# Patient Record
Sex: Male | Born: 1960
Health system: Southern US, Community
[De-identification: ages and names within clinical notes are randomized; demographics above are authoritative.]

## PROBLEM LIST (undated history)

## (undated) DIAGNOSIS — F411 Generalized anxiety disorder: Secondary | ICD-10-CM

## (undated) DIAGNOSIS — E781 Pure hyperglyceridemia: Secondary | ICD-10-CM

## (undated) DIAGNOSIS — E119 Type 2 diabetes mellitus without complications: Secondary | ICD-10-CM

## (undated) DIAGNOSIS — F329 Major depressive disorder, single episode, unspecified: Secondary | ICD-10-CM

## (undated) DIAGNOSIS — I10 Essential (primary) hypertension: Secondary | ICD-10-CM

## (undated) DIAGNOSIS — I509 Heart failure, unspecified: Secondary | ICD-10-CM

## (undated) HISTORY — PX: TONSILLECTOMY: SUR1361

## (undated) HISTORY — DX: Generalized anxiety disorder: F41.1

## (undated) HISTORY — DX: Heart failure, unspecified: I50.9

## (undated) HISTORY — DX: Major depressive disorder, single episode, unspecified: F32.9

## (undated) HISTORY — DX: Type 2 diabetes mellitus without complications: E11.9

---

## 2012-08-25 ENCOUNTER — Emergency Department (INDEPENDENT_AMBULATORY_CARE_PROVIDER_SITE_OTHER)
Admission: EM | Admit: 2012-08-25 | Discharge: 2012-08-25 | Disposition: A | Discharge status: Home / Self Care | Payer: No Typology Code available for payment source | Source: Home / Self Care | Attending: Family Medicine | Admitting: Family Medicine

## 2012-08-25 ENCOUNTER — Encounter: Payer: Self-pay | Admitting: *Deleted

## 2012-08-25 DIAGNOSIS — J111 Influenza due to unidentified influenza virus with other respiratory manifestations: Secondary | ICD-10-CM

## 2012-08-25 HISTORY — DX: Pure hyperglyceridemia: E78.1

## 2012-08-25 MED ORDER — HYDROCOD POLST-CHLORPHEN POLST 10-8 MG/5ML PO LQCR: 5.0000 mL | Freq: Every evening | ORAL | Status: DC | PRN

## 2012-08-25 MED ORDER — CLARITHROMYCIN 500 MG PO TABS: 500.0000 mg | ORAL_TABLET | Freq: Two times a day (BID) | ORAL | Status: DC

## 2012-08-25 NOTE — ED Notes (Signed)
Patient c/o dry cough, congestion, body ache and wheezing x 2-3 days.

## 2012-08-25 NOTE — ED Provider Notes (Signed)
History     CSN: [id number]  Arrival date & time 08/25/12  17   First MD Initiated Contact with Patient 08/25/12 1628      Chief Complaint  Patient presents with  . Cough     HPI Comments: Patient complains of onset of flu-like symptoms about 3 days ago with sudden onset mild sore throat, myalgias, fatigue, sinus congestion, chills, and cough.  The cough has now become worse and is partly productive.  He had wheezing last night.   He has not had influenza immunization for this season.    The history is provided by the patient.    Past Medical History  Diagnosis Date  . Diabetes mellitus without complication   . High triglycerides     Past Surgical History  Procedure Date  . Tonsillectomy     Family History  Problem Relation Age of Onset  . Heart failure Mother     History  Substance Use Topics  . Smoking status: Never Smoker   . Smokeless tobacco: Not on file  . Alcohol Use: No      Review of Systems + sore throat + cough No pleuritic pain + wheezing + nasal congestion + post-nasal drainage No sinus pain/pressure No itchy/red eyes No earache No hemoptysis + SOB No fever, + chills + nausea No vomiting No abdominal pain + diarrhea, resolved No urinary symptoms No skin rashes + fatigue No myalgias No headache Used OTC meds without relief  Allergies  Review of patient's allergies indicates no known allergies.  Home Medications   Current Outpatient Rx  Name  Route  Sig  Dispense  Refill  . HYDROCOD POLST-CPM POLST ER 10-8 MG/5ML PO LQCR   Oral   Take 5 mLs by mouth at bedtime as needed.   115 mL   0   . CLARITHROMYCIN 500 MG PO TABS   Oral   Take 1 tablet (500 mg total) by mouth 2 (two) times daily.   14 tablet   0     BP 142/95  Pulse 96  Temp 99 F (37.2 C) (Oral)  Resp 16  Ht 5' 8.5" (1.74 m)  Wt 181 lb (82.101 kg)  BMI 27.12 kg/m2  SpO2 97%  Physical Exam Nursing notes and Vital Signs reviewed. Appearance:  Patient  appears healthy, stated age, and in no acute distress Eyes:  Pupils are equal, round, and reactive to light and accomodation.  Extraocular movement is intact.  Conjunctivae are not inflamed  Ears:  Canals normal.  Tympanic membranes normal.  Nose:  Mildly congested turbinates.  No sinus tenderness.   Pharynx:  Normal Neck:  Supple.  Slightly tender shotty posterior nodes are palpated bilaterally  Lungs:  Clear to auscultation.  Breath sounds are equal.  Heart:  Regular rate and rhythm without murmurs, rubs, or gallops.  Abdomen:  Nontender without masses or hepatosplenomegaly.  Bowel sounds are present.  No CVA or flank tenderness.  Extremities:  No edema.  No calf tenderness Skin:  No rash present.   ED Course  Procedures  none      1. Influenza-like illness       MDM  Empirically begin Biaxin to cover atypical agents (too late for Tamiflu).  Tussionex at bedtime. Take plain Mucinex (guaifenesin) twice daily for cough and congestion.  Increase fluid intake, rest. May use Afrin nasal spray (or generic oxymetazoline) twice daily for about 5 days.  Also recommend using saline nasal spray several times daily and saline nasal irrigation (  AYR is a common brand) Stop all antihistamines for now, and other non-prescription cough/cold preparations. May take Ibuprofen 200mg , 4 tabs every 8 hours with food for chest discomfort, sore throat, headache, fever, etc. If symptoms become significantly worse during the night or over the weekend, proceed to the local emergency room        Kandra Nicolas, MD 08/28/12 [phone]

## 2013-05-17 ENCOUNTER — Emergency Department (INDEPENDENT_AMBULATORY_CARE_PROVIDER_SITE_OTHER)
Admission: EM | Admit: 2013-05-17 | Discharge: 2013-05-17 | Disposition: A | Discharge status: Home / Self Care | Payer: No Typology Code available for payment source | Source: Home / Self Care | Attending: Emergency Medicine | Admitting: Emergency Medicine

## 2013-05-17 ENCOUNTER — Encounter: Payer: Self-pay | Admitting: Emergency Medicine

## 2013-05-17 DIAGNOSIS — N529 Male erectile dysfunction, unspecified: Secondary | ICD-10-CM

## 2013-05-17 DIAGNOSIS — IMO0002 Reserved for concepts with insufficient information to code with codable children: Secondary | ICD-10-CM

## 2013-05-17 DIAGNOSIS — L03012 Cellulitis of left finger: Secondary | ICD-10-CM

## 2013-05-17 DIAGNOSIS — E1169 Type 2 diabetes mellitus with other specified complication: Secondary | ICD-10-CM

## 2013-05-17 HISTORY — DX: Essential (primary) hypertension: I10

## 2013-05-17 MED ORDER — TRAMADOL HCL 50 MG PO TABS: 50.0000 mg | ORAL_TABLET | Freq: Four times a day (QID) | ORAL | Status: DC | PRN

## 2013-05-17 MED ORDER — TADALAFIL 5 MG PO TABS: 5.0000 mg | ORAL_TABLET | Freq: Every day | ORAL | Status: DC | PRN

## 2013-05-17 MED ORDER — SULFAMETHOXAZOLE-TMP DS 800-160 MG PO TABS: 1.0000 | ORAL_TABLET | Freq: Two times a day (BID) | ORAL | Status: DC

## 2013-05-17 NOTE — ED Notes (Signed)
Reports trying to resolve ingrown toenail right large toe about 2 weeks ago; now sore and possibly infected. Took ibuprofen 2 hours ago.

## 2013-05-17 NOTE — ED Provider Notes (Signed)
CSN: [id number]     Arrival date & time 05/17/13  1045 History   First MD Initiated Contact with Patient 05/17/13 1046     Chief Complaint  Patient presents with  . Toe Pain   (Consider location/radiation/quality/duration/timing/severity/associated sxs/prior Treatment) HPI 1) Pt c/o swelling and pain L great toe for 2 weeks.  Has been picking and squeezing which has caused some pus to come out.  Not using heat or cold.  Doesn't seem to be getting much better.  Has been using Ibu 600 BID with some success. 2) Pt requesting Rx for Cialis.  Has coupon with his.  Has ED.  Doesn't take Nitrates, has no CP, and has no h/o vasovagal symptoms. 3) Pt c/o intermittent depression.  Occasionally has taken meds in the past but is requesting something.  No SI/HI.  Feels depressed sometimes and has stress especially at work.  Past Medical History  Diagnosis Date  . Diabetes mellitus without complication   . High triglycerides   . Hypertension    Past Surgical History  Procedure Laterality Date  . Tonsillectomy     Family History  Problem Relation Age of Onset  . Heart failure Mother    History  Substance Use Topics  . Smoking status: Never Smoker   . Smokeless tobacco: Not on file  . Alcohol Use: Yes    Review of Systems  All other systems reviewed and are negative.    Allergies  Penicillins and Terramycin  Home Medications   Current Outpatient Rx  Name  Route  Sig  Dispense  Refill  . chlorpheniramine-HYDROcodone (TUSSIONEX PENNKINETIC ER) 10-8 MG/5ML LQCR   Oral   Take 5 mLs by mouth at bedtime as needed.   115 mL   0   . clarithromycin (BIAXIN) 500 MG tablet   Oral   Take 1 tablet (500 mg total) by mouth 2 (two) times daily.   14 tablet   0   . sulfamethoxazole-trimethoprim (BACTRIM DS) 800-160 MG per tablet   Oral   Take 1 tablet by mouth 2 (two) times daily.   20 tablet   0   . tadalafil (CIALIS) 5 MG tablet   Oral   Take 1 tablet (5 mg total) by mouth daily  as needed for erectile dysfunction.   30 tablet   0   . traMADol (ULTRAM) 50 MG tablet   Oral   Take 1 tablet (50 mg total) by mouth every 6 (six) hours as needed for pain.   16 tablet   0    BP 142/83  Pulse 87  Temp(Src) 98.2 F (36.8 C) (Oral)  Resp 18  Ht 5' 8.5" (1.74 m)  Wt 186 lb (84.369 kg)  BMI 27.87 kg/m2  SpO2 97% Physical Exam  Nursing note and vitals reviewed. Constitutional: He is oriented to person, place, and time. He appears well-developed and well-nourished.  HENT:  Head: Normocephalic and atraumatic.  Eyes: No scleral icterus.  Neck: Neck supple.  Cardiovascular: Regular rhythm and normal heart sounds.   Pulmonary/Chest: Effort normal and breath sounds normal. No respiratory distress.  Neurological: He is alert and oriented to person, place, and time.  Skin: Skin is warm and dry.  L great toe medial aspect with tenderness, redness, swelling, and dried scab.  FROM IP and MTP.  No other tenderness in foot or ankle.  Cap refill and sensation normal.  Psychiatric: He has a normal mood and affect. His speech is normal.    ED Course  Procedures (including critical care time) Labs Review Labs Reviewed - No data to display Imaging Review No results found.  EKG Interpretation     Ventricular Rate:    PR Interval:    QRS Duration:   QT Interval:    QTC Calculation:   R Axis:     Text Interpretation:              MDM   1. Paronychia, left   2. Erectile dysfunction associated with type 2 diabetes mellitus    1) Patient with a paronychia of L great toe.  Will place on 10 days Bactrim + warm compresses.  Can consider partial toenail excision in the future if not improving.  Also gave Rx for Tramadol for pain.  2) Did give temporary Rx for Cialis but will need to get all additional Rx for ED from PCP.  Likely secondary to DM2, but patient states that he is upset when all docs attribute everything to his DM2.  3) Did not give SSRI.  We deferred  this to his PCP.  We walked patient to the PCP next door so he can make appt to establish.  No SI/HI.    Janeann Forehand, MD 05/17/13 1126

## 2013-05-23 ENCOUNTER — Telehealth: Payer: Self-pay | Admitting: *Deleted

## 2013-05-25 ENCOUNTER — Ambulatory Visit (INDEPENDENT_AMBULATORY_CARE_PROVIDER_SITE_OTHER): Payer: No Typology Code available for payment source | Admitting: Family Medicine

## 2013-05-25 ENCOUNTER — Encounter: Payer: Self-pay | Admitting: Family Medicine

## 2013-05-25 VITALS — BP 136/75 | HR 80 | Ht 68.5 in | Wt 183.0 lb

## 2013-05-25 DIAGNOSIS — E119 Type 2 diabetes mellitus without complications: Secondary | ICD-10-CM

## 2013-05-25 DIAGNOSIS — E1142 Type 2 diabetes mellitus with diabetic polyneuropathy: Secondary | ICD-10-CM | POA: Insufficient documentation

## 2013-05-25 DIAGNOSIS — F329 Major depressive disorder, single episode, unspecified: Secondary | ICD-10-CM

## 2013-05-25 DIAGNOSIS — E1165 Type 2 diabetes mellitus with hyperglycemia: Secondary | ICD-10-CM | POA: Insufficient documentation

## 2013-05-25 DIAGNOSIS — F3289 Other specified depressive episodes: Secondary | ICD-10-CM

## 2013-05-25 DIAGNOSIS — F32A Depression, unspecified: Secondary | ICD-10-CM

## 2013-05-25 DIAGNOSIS — M79674 Pain in right toe(s): Secondary | ICD-10-CM

## 2013-05-25 DIAGNOSIS — M79609 Pain in unspecified limb: Secondary | ICD-10-CM

## 2013-05-25 DIAGNOSIS — N529 Male erectile dysfunction, unspecified: Secondary | ICD-10-CM | POA: Insufficient documentation

## 2013-05-25 MED ORDER — PAROXETINE HCL 20 MG PO TABS: 20.0000 mg | ORAL_TABLET | ORAL | Status: DC

## 2013-05-25 MED ORDER — ONETOUCH ULTRA SYSTEM W/DEVICE KIT: PACK | Status: DC

## 2013-05-25 NOTE — Progress Notes (Signed)
CC: Seth Weeks is a 52 y.o. male is here for Establish Care and Depression   Subjective: HPI:   complains of subjective depression that has been present for the last 3 months on a daily basis worsening on a daily basis now moderate in severity. Describes subjective feelings of guilt and lack of interest in all hobbies. Symptoms are present everyday all hours of the day worse due to stress at work nothing particularly makes it better. He has had similar symptoms in the past and had good improvement on Paxil. Denies paranoia, nor mental disturbance of the above. Denies recreational drug use, alcohol use or tobacco use  Complains of continued right great toe pain is been present for the past 3 weeks. He has finished 10 days of Bactrim which made some progress but has not worsened. He describes redness at the nail fold laterally without discharge. He denies any recent trauma.  Reports a history of type 2 diabetes he is unsure about his A1c he does admit to polyuria and polyphagia. He takes metformin daily he is unsure about his dose. He does not take his blood sugars at home  Review of Systems - General ROS: negative for - chills, fever, night sweats, weight gain or weight loss Ophthalmic ROS: negative for - decreased vision Psychological ROS: negative for - anxiety  ENT ROS: negative for - hearing change, nasal congestion, tinnitus or allergies Hematological and Lymphatic ROS: negative for - bleeding problems, bruising or swollen lymph nodes Breast ROS: negative Respiratory ROS: no cough, shortness of breath, or wheezing Cardiovascular ROS: no chest pain or dyspnea on exertion Gastrointestinal ROS: no abdominal pain, change in bowel habits, or black or bloody stools Genito-Urinary ROS: negative for - genital discharge, genital ulcers, incontinence or abnormal bleeding from genitals Musculoskeletal ROS: negative for - joint pain or muscle pain other than that described  above Neurological ROS: negative for - headaches or memory loss Dermatological ROS: negative for lumps, mole changes, rash and skin lesion changes  Past Medical History  Diagnosis Date  . Diabetes mellitus without complication   . High triglycerides   . Hypertension      Family History  Problem Relation Age of Onset  . Heart failure Mother      History  Substance Use Topics  . Smoking status: Never Smoker   . Smokeless tobacco: Not on file  . Alcohol Use: Yes     Objective: Filed Vitals:   05/25/13 1134  BP: 136/75  Pulse: 80    General: Alert and Oriented, No Acute Distress HEENT: Pupils equal, round, reactive to light. Conjunctivae clear.  Moist mucous membranes Lungs: Clear to auscultation bilaterally, no wheezing/ronchi/rales.  Comfortable work of breathing. Good air movement. Cardiac: Regular rate and rhythm. Normal S1/S2.  No murmurs, rubs, nor gallops.   Extremities: No peripheral edema.  Strong peripheral pulses. Right great toe has mild redness at the lateral nail fold appears there is an ingrown toenail underneath this nail fold there is no warmth or swelling nor discharge Mental Status: No depression, anxiety, nor agitation. Skin: Warm and dry.  Assessment & Plan: Seth Weeks was seen today for establish care and depression.  Diagnoses and associated orders for this visit:  Type 2 diabetes mellitus - Blood Glucose Monitoring Suppl (ONE TOUCH ULTRA SYSTEM KIT) W/DEVICE KIT; Use to check blood sugar as needed  Depression - PARoxetine (PAXIL) 20 MG tablet; Take 1 tablet (20 mg total) by mouth every morning.  Great toe pain, right  Take 2 diabetes: Clinically uncontrolled he does not want labs today, he is interested in checking his blood sugar at home therefore prescription for device provided asked him to followup for diabetes management including labs at his nearest convenience Depression: Uncontrolled restart Paxil Great toe pain: Discussed soaking toe 20  minutes 3 times a day avoid manipulation of possible discussed warning signs of infection that would require starting clindamycin and Cipro  Return in about 4 weeks (around 06/22/2013).

## 2013-06-14 ENCOUNTER — Encounter: Payer: Self-pay | Admitting: Family Medicine

## 2013-06-14 ENCOUNTER — Ambulatory Visit (INDEPENDENT_AMBULATORY_CARE_PROVIDER_SITE_OTHER): Payer: No Typology Code available for payment source | Admitting: Family Medicine

## 2013-06-14 VITALS — BP 152/91 | HR 89 | Temp 98.4°F | Wt 184.0 lb

## 2013-06-14 DIAGNOSIS — E11628 Type 2 diabetes mellitus with other skin complications: Secondary | ICD-10-CM

## 2013-06-14 DIAGNOSIS — L089 Local infection of the skin and subcutaneous tissue, unspecified: Secondary | ICD-10-CM

## 2013-06-14 DIAGNOSIS — E1169 Type 2 diabetes mellitus with other specified complication: Secondary | ICD-10-CM

## 2013-06-14 DIAGNOSIS — F3289 Other specified depressive episodes: Secondary | ICD-10-CM

## 2013-06-14 DIAGNOSIS — F32A Depression, unspecified: Secondary | ICD-10-CM

## 2013-06-14 DIAGNOSIS — F329 Major depressive disorder, single episode, unspecified: Secondary | ICD-10-CM

## 2013-06-14 MED ORDER — PAROXETINE HCL 40 MG PO TABS: 40.0000 mg | ORAL_TABLET | ORAL | Status: DC

## 2013-06-14 MED ORDER — CLINDAMYCIN HCL 300 MG PO CAPS: 300.0000 mg | ORAL_CAPSULE | Freq: Three times a day (TID) | ORAL | Status: DC

## 2013-06-14 MED ORDER — TRAMADOL HCL 50 MG PO TABS: 50.0000 mg | ORAL_TABLET | Freq: Four times a day (QID) | ORAL | Status: DC | PRN

## 2013-06-14 MED ORDER — CIPROFLOXACIN HCL 500 MG PO TABS: 500.0000 mg | ORAL_TABLET | Freq: Two times a day (BID) | ORAL | Status: DC

## 2013-06-14 NOTE — Progress Notes (Signed)
CC: Seth Weeks is a 52 y.o. male is here for infected toe   Subjective: HPI:  Reports that right great toe pain was improving over the past week, 3 days ago he stopped soaking it and he woke up this morning with return of moderate pain with redness and swelling on the medial nail border. He has been picking at it with his hands, he has been trying to express pus but has not been getting any success there is mild bleeding with manipulation, no other interventions.  Denies recent trauma. Denies fevers, chills, nausea, rapid heartbeat, flushing  Patient reports that depression is only mildly improved. He still describes it as irritability and dread what comes to going to work. His getting in the way of his quality of life on a daily basis still moderate in severity. Worse when working with others at work improves in free time. Denies thoughts wanting to harm herself or others or any other mental disturbance has been taking Paxil 20 mg on a daily basis without missed doses nor known side effects   Review Of Systems Outlined In HPI  Past Medical History  Diagnosis Date  . Diabetes mellitus without complication   . High triglycerides   . Hypertension      Family History  Problem Relation Age of Onset  . Heart failure Mother      History  Substance Use Topics  . Smoking status: Never Smoker   . Smokeless tobacco: Not on file  . Alcohol Use: Yes     Objective: Filed Vitals:   06/14/13 1105  BP: 152/91  Pulse: 89  Temp: 98.4 F (36.9 C)   Vital signs reviewed. General: Alert and Oriented, No Acute Distress HEENT: Pupils equal, round, reactive to light. Conjunctivae clear.  External ears unremarkable.  Moist mucous membranes. Lungs: Clear and comfortable work of breathing, speaking in full sentences without accessory muscle use. Cardiac: Regular rate and rhythm.  Neuro: CN II-XII grossly intact, gait normal. Extremities: No peripheral edema.  Strong peripheral pulses.  The right great toe has mild erythema and swelling from the medial nail fold extending back into the cuticle there is no fluctuance, the medial distal nail appears completely free from the nail fold Mental Status: No depression, anxiety, nor agitation. Logical though process. Skin: Warm and dry.  Assessment & Plan: Seth Weeks was seen today for infected toe.  Diagnoses and associated orders for this visit:  Depression - PARoxetine (PAXIL) 40 MG tablet; Take 1 tablet (40 mg total) by mouth every morning.  Diabetic foot infection - ciprofloxacin (CIPRO) 500 MG tablet; Take 1 tablet (500 mg total) by mouth 2 (two) times daily. - clindamycin (CLEOCIN) 300 MG capsule; Take 1 capsule (300 mg total) by mouth 3 (three) times daily.  Other Orders - traMADol (ULTRAM) 50 MG tablet; Take 1 tablet (50 mg total) by mouth every 6 (six) hours as needed.    Depression: Uncontrolled increasing Paxil Diabetic foot infection: It does not appear there is any abscess or pus to be drained, at this point I do not think that that nail needs to be removed however if no improvement with 48 hours of Cipro and clindamycin along with dial soap soaks 3 times a day presented to urgent care for nail removal  Return in about 4 weeks (around 07/12/2013).

## 2013-06-19 ENCOUNTER — Emergency Department
Admission: EM | Admit: 2013-06-19 | Discharge: 2013-06-19 | Disposition: A | Discharge status: Home / Self Care | Payer: No Typology Code available for payment source | Source: Home / Self Care | Attending: Emergency Medicine | Admitting: Emergency Medicine

## 2013-06-19 ENCOUNTER — Other Ambulatory Visit: Payer: Self-pay | Admitting: Emergency Medicine

## 2013-06-19 ENCOUNTER — Encounter: Payer: Self-pay | Admitting: Emergency Medicine

## 2013-06-19 DIAGNOSIS — A09 Infectious gastroenteritis and colitis, unspecified: Secondary | ICD-10-CM

## 2013-06-19 MED ORDER — HYOSCYAMINE SULFATE 0.125 MG PO TABS: ORAL_TABLET | ORAL | Status: DC

## 2013-06-19 MED ORDER — METRONIDAZOLE 500 MG PO TABS: 500.0000 mg | ORAL_TABLET | Freq: Two times a day (BID) | ORAL | Status: DC

## 2013-06-19 NOTE — ED Provider Notes (Signed)
CSN: [id number]     Arrival date & time 06/19/13  1415 History   First MD Initiated Contact with Patient 06/19/13 1419     Chief Complaint  Patient presents with  . Diarrhea   (Consider location/radiation/quality/duration/timing/severity/associated sxs/prior Treatment) Patient is a 52 y.o. male presenting with diarrhea. The history is provided by the patient.  Diarrhea Quality:  Unable to specify (But denies blood or mucus) Severity:  Severe (At least 10 times a day) Onset quality:  Unable to specify Duration:  1 day Timing:  Intermittent Progression:  Worsening Relieved by:  Nothing Exacerbated by: Drinking milk. Associated symptoms: abdominal pain (denies frank abdominal pain, but has episodic abdominal cramping with the diarrhea) and myalgias   Associated symptoms: no arthralgias, no chills, no recent cough, no diaphoresis, no fever, no headaches, no URI and no vomiting   Risk factors: recent antibiotic use (Was on Septra last month, then Cipro and clindamycin prescribed by his PCP 11/13 for toe cellulitis, which is improved)   Risk factors: no sick contacts, no suspicious food intake and no travel to endemic areas     Past Medical History  Diagnosis Date  . Diabetes mellitus without complication   . High triglycerides   . Hypertension    Past Surgical History  Procedure Laterality Date  . Tonsillectomy     Family History  Problem Relation Age of Onset  . Heart failure Mother    History  Substance Use Topics  . Smoking status: Never Smoker   . Smokeless tobacco: Not on file  . Alcohol Use: Yes    Review of Systems  Constitutional: Negative for fever, chills and diaphoresis.  Gastrointestinal: Positive for abdominal pain (denies frank abdominal pain, but has episodic abdominal cramping with the diarrhea) and diarrhea. Negative for vomiting.  Musculoskeletal: Positive for myalgias. Negative for arthralgias.  Neurological: Negative for headaches.  All other systems  reviewed and are negative.    Allergies  Penicillins and Terramycin  Home Medications   Current Outpatient Rx  Name  Route  Sig  Dispense  Refill  . Blood Glucose Monitoring Suppl (ONE TOUCH ULTRA SYSTEM KIT) W/DEVICE KIT      Use to check blood sugar as needed   1 each   0   . hyoscyamine (LEVSIN, ANASPAZ) 0.125 MG tablet      Take one by mouth every 6 hours as needed for abdominal cramping or pain   15 tablet   0   . METFORMIN HCL PO   Oral   Take by mouth.         . metroNIDAZOLE (FLAGYL) 500 MG tablet   Oral   Take 1 tablet (500 mg total) by mouth 2 (two) times daily.   20 tablet   0   . PARoxetine (PAXIL) 40 MG tablet   Oral   Take 1 tablet (40 mg total) by mouth every morning.   30 tablet   3   . tadalafil (CIALIS) 5 MG tablet   Oral   Take 1 tablet (5 mg total) by mouth daily as needed for erectile dysfunction.   30 tablet   0   . traMADol (ULTRAM) 50 MG tablet   Oral   Take 1 tablet (50 mg total) by mouth every 6 (six) hours as needed.   30 tablet   0    BP 149/89  Pulse 84  Temp(Src) 97.9 F (36.6 C) (Oral)  Ht 5\' 8"  (1.727 m)  Wt 175 lb (79.379 kg)  BMI 26.61 kg/m2  SpO2 98% Physical Exam  Nursing note and vitals reviewed. Constitutional: He is oriented to person, place, and time. He appears well-developed and well-nourished. No distress.  Fatigued, in no acute distress  HENT:  Right Ear: External ear normal.  Left Ear: External ear normal.  Nose: Nose normal.  Mouth/Throat: Oropharynx is clear and moist. No oropharyngeal exudate.  Eyes: Conjunctivae are normal. Right eye exhibits no discharge. Left eye exhibits no discharge. No scleral icterus.  Neck: Normal range of motion. Neck supple.  Cardiovascular: Normal rate, regular rhythm and normal heart sounds.   Pulmonary/Chest: Breath sounds normal.  Abdominal: Soft. He exhibits no abdominal bruit and no pulsatile midline mass. Bowel sounds are increased. There is no  hepatosplenomegaly. There is tenderness (Minimal, diffuse). There is no rigidity, no rebound, no guarding, no CVA tenderness, no tenderness at McBurney's point and negative Murphy's sign.  Musculoskeletal: Normal range of motion.  Neurological: He is alert and oriented to person, place, and time. No cranial nerve deficit.  Skin: Skin is warm and dry. No rash noted.  Psychiatric: He has a normal mood and affect.    ED Course  Procedures (including critical care time) Labs Review Labs Reviewed  STOOL CULTURE  OVA AND PARASITE EXAMINATION  FECAL LACTOFERRIN  GI PATHOGEN PANEL BY PCR, STOOL   Imaging Review No results found.  EKG Interpretation    Date/Time:    Ventricular Rate:    PR Interval:    QRS Duration:   QT Interval:    QTC Calculation:   R Axis:     Text Interpretation:              MDM   1. Infectious diarrhea    In the differential for his diarrhea, is most likely that his C. difficile colitis but other pathogens possible. He's been on several antibiotics in the past month, which places him at risk for C. difficile colitis.--He does not have clinical signs of dehydration.  No recent travel, and no other ill contacts. Clinically, he appears ill but not toxic.--He can be treated as an outpatient. Risks, benefits, alternatives discussed. He agrees with the following plans: Stool culture, stool for C. difficile, O&P. Begin empiric metronidazole. Levsin when necessary abdominal cramping, but advised this should be used sparingly. Precautions discussed. Push clear liquids. Avoid dairy and advance to bland food. Other symptomatic care and advice given. Followup with PCP in 5-7 days, sooner if worse or new symptoms. Precautions discussed. Red flags discussed. Questions invited and answered. Patient voiced understanding and agreement.     Jacqulyn Cane, MD 06/19/13 [phone]

## 2013-06-19 NOTE — ED Notes (Signed)
Diarrhea started yesterday morning, slight nausea today

## 2013-06-20 LAB — OVA AND PARASITE EXAMINATION: OP: NONE SEEN

## 2013-06-20 LAB — CLOSTRIDIUM DIFFICILE EIA: CDIFTX: NEGATIVE

## 2013-06-21 LAB — FECAL LACTOFERRIN, QUANT: Lactoferrin: NEGATIVE

## 2013-06-23 LAB — STOOL CULTURE

## 2013-06-24 LAB — STOOL CULTURE
Culture: NO GROWTH
Organism ID, Bacteria: NO GROWTH

## 2013-07-06 ENCOUNTER — Encounter: Payer: Self-pay | Admitting: Sports Medicine

## 2013-07-06 ENCOUNTER — Ambulatory Visit (INDEPENDENT_AMBULATORY_CARE_PROVIDER_SITE_OTHER): Payer: No Typology Code available for payment source | Admitting: Sports Medicine

## 2013-07-06 VITALS — BP 153/93 | HR 79 | Wt 188.0 lb

## 2013-07-06 DIAGNOSIS — IMO0002 Reserved for concepts with insufficient information to code with codable children: Secondary | ICD-10-CM | POA: Insufficient documentation

## 2013-07-06 DIAGNOSIS — L03011 Cellulitis of right finger: Secondary | ICD-10-CM

## 2013-07-06 NOTE — Assessment & Plan Note (Signed)
Essentially resolved now after several courses of antibiotics. He does have a mildly ingrown toenail, if this recurs I would recommend lateral nail plate excision.

## 2013-07-06 NOTE — Progress Notes (Signed)
  Subjective:    CC: Followup  HPI: Toe infection: Seth Weeks has what seems to be a paronychia, he was treated with clindamycin and ciprofloxacin, developed some diarrhea and was seen in the urgent care and treated with metronidazole for presumed Clostridium difficile diarrhea. His toe has been improving steadily, and currently is almost pain-free.  Past medical history, Surgical history, Family history not pertinant except as noted below, Social history, Allergies, and medications have been entered into the medical record, reviewed, and no changes needed.   Review of Systems: No fevers, chills, night sweats, weight loss, chest pain, or shortness of breath.   Objective:    General: Well Developed, well nourished, and in no acute distress.  Neuro: Alert and oriented x3, extra-ocular muscles intact, sensation grossly intact.  HEENT: Normocephalic, atraumatic, pupils equal round reactive to light, neck supple, no masses, no lymphadenopathy, thyroid nonpalpable.  Skin: Warm and dry, no rashes. Cardiac: Regular rate and rhythm, no murmurs rubs or gallops, no lower extremity edema.  Respiratory: Clear to auscultation bilaterally. Not using accessory muscles, speaking in full sentences. Right great toe: Along the lateral nail plate, there is only minimal and per patient resolving erythema without induration, no drainage. The toenail does appear mildly ingrown. Neurovascularly intact.  Impression and Recommendations:

## 2013-07-23 ENCOUNTER — Encounter: Payer: Self-pay | Admitting: Family Medicine

## 2013-07-23 ENCOUNTER — Ambulatory Visit (INDEPENDENT_AMBULATORY_CARE_PROVIDER_SITE_OTHER): Payer: No Typology Code available for payment source | Admitting: Family Medicine

## 2013-07-23 VITALS — BP 143/91 | HR 86 | Wt 188.0 lb

## 2013-07-23 DIAGNOSIS — H8110 Benign paroxysmal vertigo, unspecified ear: Secondary | ICD-10-CM | POA: Insufficient documentation

## 2013-07-23 NOTE — Progress Notes (Signed)
CC: Seth Weeks is a 52 y.o. male is here for Dizziness   Subjective: HPI:  Patient complains of dizziness that has been present for the last 3 weeks on less than most days of the week frequency. He has been slowly improving over the past week without any particular intervention. He describes as 1-2 seconds of his surroundings spinning it starts and resolves abruptly without any particular intervention. Occurs mostly with movement. He's never had this before. He denies any motor sensory disturbances that have been occurring other than above. Denies any ear pain, hearing loss, nor tinnitus. Denies nasal congestion, fevers, chills, headache, confusion, memory loss, nor recent head trauma   Review Of Systems Outlined In HPI  Past Medical History  Diagnosis Date  . Diabetes mellitus without complication   . High triglycerides   . Hypertension      Family History  Problem Relation Age of Onset  . Heart failure Mother      History  Substance Use Topics  . Smoking status: Never Smoker   . Smokeless tobacco: Not on file  . Alcohol Use: Yes     Objective: Filed Vitals:   07/23/13 1439  BP: 143/91  Pulse: 86    General: Alert and Oriented, No Acute Distress HEENT: Pupils equal, round, reactive to light. Conjunctivae clear.  External ears unremarkable, canals clear with intact TMs with appropriate landmarks.  Middle ear appears open without effusion. Pink inferior turbinates.  Moist mucous membranes, pharynx without inflammation nor lesions.  Neck supple without palpable lymphadenopathy nor abnormal masses. Neuro: CN II-XII grossly intact, full strength/rom of all four extremities, gait normal, rapid alternating movements normal, heel-shin test normal, Rhomberg normal. Dix-Hallpike reproduced his symptoms.  Lungs: Clear to auscultation bilaterally, no wheezing/ronchi/rales.  Comfortable work of breathing. Good air movement. Cardiac: Regular rate and rhythm. Normal S1/S2.  No murmurs,  rubs, nor gallops.  No carotid bruits  Mental Status: No depression, anxiety, nor agitation. Skin: Warm and dry.  Assessment & Plan: Garbiel was seen today for dizziness.  Diagnoses and associated orders for this visit:  BPV (benign positional vertigo)    BPV: Discussed that doing modified Epley maneuver at home may help speed up resolution of symptoms.Signs and symptoms requring emergent/urgent reevaluation were discussed with the patient.  25 minutes spent face-to-face during visit today of which at least 50% was counseling or coordinating care regarding benign positional vertigo.   Return if symptoms worsen or fail to improve.

## 2013-08-31 ENCOUNTER — Encounter: Payer: Self-pay | Admitting: Family Medicine

## 2013-08-31 ENCOUNTER — Ambulatory Visit (INDEPENDENT_AMBULATORY_CARE_PROVIDER_SITE_OTHER): Payer: No Typology Code available for payment source | Admitting: Family Medicine

## 2013-08-31 VITALS — BP 165/96 | HR 83 | Wt 186.0 lb

## 2013-08-31 DIAGNOSIS — K429 Umbilical hernia without obstruction or gangrene: Secondary | ICD-10-CM

## 2013-08-31 DIAGNOSIS — E119 Type 2 diabetes mellitus without complications: Secondary | ICD-10-CM

## 2013-08-31 NOTE — Progress Notes (Signed)
CC: Seth Weeks is a 53 y.o. male is here for discuss hernia   Subjective: HPI:  Patient complains of enlarging umbilical hernia that has been present for years worsening over the last few months. He wants to know if he should have this repaired, it has been causing him discomfort off and on throughout the day at least for the past month. Nothing particularly makes it better or worse. He reports occasional diarrhea but denies blood in stool or melena. Denies nausea, vomiting or decreased appetite. Pain is described only has pain and mild in severity localized at the umbilicus. He wants to know if he should have a procedure that does not include mesh at a clinic he found on the Internet located in [place] work he should have it done at [facility] here in [place].  Followup type 2 diabetes: Discussed with patient that he is considering surgery will be even more important to have tight blood sugar control. He states that he does not want to take any medication for type 2 diabetes.   Review Of Systems Outlined In HPI  Past Medical History  Diagnosis Date  . Diabetes mellitus without complication   . High triglycerides   . Hypertension      Family History  Problem Relation Age of Onset  . Heart failure Mother      History  Substance Use Topics  . Smoking status: Never Smoker   . Smokeless tobacco: Not on file  . Alcohol Use: Yes     Objective: Filed Vitals:   08/31/13 1104  BP: 165/96  Pulse: 83    General: Alert and Oriented, No Acute Distress HEENT: Pupils equal, round, reactive to light. Conjunctivae clear.  Moist mucous membranes pharynx unremarkable. Lungs: Clear to auscultation bilaterally, no wheezing/ronchi/rales.  Comfortable work of breathing. Good air movement. Cardiac: Regular rate and rhythm. Normal S1/S2.  No murmurs, rubs, nor gallops.   Abdomen: Normal bowel sounds, soft and non tender, there is an approximately 2 cm abdominal wall defect  at the umbilicus with a hernia that is nontender but not reducible by myself Extremities: No peripheral edema.  Strong peripheral pulses.  Mental Status: No depression, anxiety, nor agitation. Skin: Warm and dry.  Assessment & Plan: Freemon was seen today for discuss hernia.  Diagnoses and associated orders for this visit:  Umbilical hernia  Type 2 diabetes mellitus    Umbilical hernia: Due to pain I have encouraged him to seek the advice of [facility] as I suspect he warrants repair of his umbilical hernia, fortunately yard he has an appointment scheduled with them today. I've asked him to send me the information that he has seen on line regarding the clinic in [place] and be happy to share my impression. Type 2 diabetes: Clinically uncontrolled since he has declined medication and surveillance. I've prepared him that he will most likely have to have this tightly controlled for any possible surgery.  Return in about 3 months (around 11/29/2013).

## 2013-10-19 ENCOUNTER — Encounter: Payer: Self-pay | Admitting: Family Medicine

## 2013-10-19 ENCOUNTER — Ambulatory Visit (INDEPENDENT_AMBULATORY_CARE_PROVIDER_SITE_OTHER): Payer: No Typology Code available for payment source | Admitting: Family Medicine

## 2013-10-19 VITALS — BP 171/98 | HR 82 | Temp 98.1°F | Wt 187.0 lb

## 2013-10-19 DIAGNOSIS — F32A Depression, unspecified: Secondary | ICD-10-CM

## 2013-10-19 DIAGNOSIS — J302 Other seasonal allergic rhinitis: Secondary | ICD-10-CM

## 2013-10-19 DIAGNOSIS — J309 Allergic rhinitis, unspecified: Secondary | ICD-10-CM

## 2013-10-19 DIAGNOSIS — F329 Major depressive disorder, single episode, unspecified: Secondary | ICD-10-CM

## 2013-10-19 DIAGNOSIS — E739 Lactose intolerance, unspecified: Secondary | ICD-10-CM

## 2013-10-19 DIAGNOSIS — F3289 Other specified depressive episodes: Secondary | ICD-10-CM

## 2013-10-19 DIAGNOSIS — I110 Hypertensive heart disease with heart failure: Secondary | ICD-10-CM | POA: Insufficient documentation

## 2013-10-19 DIAGNOSIS — I10 Essential (primary) hypertension: Secondary | ICD-10-CM

## 2013-10-19 MED ORDER — AMBULATORY NON FORMULARY MEDICATION: Status: DC

## 2013-10-19 MED ORDER — LOPERAMIDE HCL 2 MG PO TABS: 2.0000 mg | ORAL_TABLET | Freq: Four times a day (QID) | ORAL | Status: DC | PRN

## 2013-10-19 MED ORDER — LACTASE 3000 UNITS PO TABS: 3000.0000 [IU] | ORAL_TABLET | Freq: Three times a day (TID) | ORAL | Status: DC | PRN

## 2013-10-19 NOTE — Progress Notes (Signed)
CC: Seth Weeks is a 53 y.o. male is here for Allergies   Subjective: HPI:  Followup allergies: Patient complains of continued allergies described as postnasal drip, cough, shortness of breath that occurs in dust and pollen dense environments. Symptoms are worse at home have been improved in the past with an air purifier. He occasionally takes over-the-counter antihistamines which slightly helps also. He has the money left over from his flexible spending account and would like to know how he could use it towards helping his allergies.  Complains of lactose intolerance that is described as mild but has been present for the majority of his life. Improves as he gets older. He occasionally takes Lactaid which completely resolves symptoms for the meal. Symptoms are described as bloating and diarrhea. He occasionally takes Imodium right ear if he forgot to take lactase.  He occasionally uses hydrogen peroxide and triple antibiotic ointment when he gets minor cuts or abrasions  Depression: Taking Paxil less than most days of the week due to forgetfulness. States work continues to contribute to irritability and subjective depression which is in his opinion stable. Denies any new mental disturbance. Denies sleep disturbance.      Review Of Systems Outlined In HPI  Past Medical History  Diagnosis Date  . Diabetes mellitus without complication   . High triglycerides   . Hypertension     Past Surgical History  Procedure Laterality Date  . Tonsillectomy     Family History  Problem Relation Age of Onset  . Heart failure Mother     History   Social History  . Marital Status: Single    Spouse Name: N/A    Number of Children: N/A  . Years of Education: N/A   Occupational History  . Not on file.   Social History Main Topics  . Smoking status: Never Smoker   . Smokeless tobacco: Not on file  . Alcohol Use: Yes  . Drug Use: No  . Sexual Activity: Not on file   Other Topics Concern  .  Not on file   Social History Narrative  . No narrative on file     Objective: BP 171/98  Pulse 82  Temp(Src) 98.1 F (36.7 C) (Oral)  Wt 187 lb (84.823 kg)   General: Alert and Oriented, No Acute Distress HEENT: Pupils equal, round, reactive to light. Conjunctivae clear.  External ears unremarkable, canals clear with intact TMs with appropriate landmarks.  Middle ear appears open without effusion. Pink inferior turbinates.  Moist mucous membranes, pharynx without inflammation nor lesions.  Neck supple without palpable lymphadenopathy nor abnormal masses. Lungs: Clear to auscultation bilaterally, no wheezing/ronchi/rales.  Comfortable work of breathing. Good air movement. Frequent coughing Cardiac: Regular rate and rhythm. Normal S1/S2.  No murmurs, rubs, nor gallops.   Mental Status: Moderate depression. No anxiety, nor agitation. Skin: Warm and dry.  Assessment & Plan: Seth Weeks was seen today for allergies.  Diagnoses and associated orders for this visit:  Essential hypertension, benign - AMBULATORY NON FORMULARY MEDICATION; [place] HEPA Air Filter: HPA 300 Model with filters to last one year.  Use daily for control of seasonal allergies.  Depression  Lactose intolerance  Seasonal allergies  Other Orders - lactase (LACTAID) 3000 UNITS tablet; Take 1 tablet (3,000 Units total) by mouth 3 (three) times daily as needed (lactose ingestion.). - loperamide (IMODIUM A-D) 2 MG tablet; Take 1 tablet (2 mg total) by mouth 4 (four) times daily as needed for diarrhea or loose stools. - AMBULATORY NON FORMULARY  MEDICATION; Hydrogen Peroxide: use to clean cuts as needed. - AMBULATORY NON FORMULARY MEDICATION; Triple antibiotic ointment: use as needed twice a day on cuts.    Essential hypertension: Uncontrolled he does not want to take medications we discussed exercise and sodium restriction. Depression: Uncontrolled, urged to take paxil daily Seasonal allergies: Uncontrolled and I would  agree that this would improve with a air purifier prescription was provided  25 minutes spent face-to-face during visit today of which at least 50% was counseling or coordinating care regarding: 1. Essential hypertension, benign   2. Depression   3. Lactose intolerance   4. Seasonal allergies        Return in about 4 weeks (around 11/16/2013).

## 2013-11-05 ENCOUNTER — Telehealth: Payer: Self-pay | Admitting: Family Medicine

## 2013-11-05 DIAGNOSIS — F32A Depression, unspecified: Secondary | ICD-10-CM

## 2013-11-05 DIAGNOSIS — F329 Major depressive disorder, single episode, unspecified: Secondary | ICD-10-CM

## 2013-11-05 MED ORDER — PAROXETINE HCL 40 MG PO TABS: 40.0000 mg | ORAL_TABLET | ORAL | Status: DC

## 2013-11-05 NOTE — Telephone Encounter (Signed)
Seth Bake, Will you please let patient know that I'm not sure how he got a hold of the 20mg  Rx, this must have been a mistake because I agree that he should be taking 40mg  daily, a new Rx reflecting this was sent to his [facility] pharmacy.

## 2013-11-05 NOTE — Telephone Encounter (Signed)
Hi Seth Weeks,  Just now got through enough emails to see your reply.  Thank you.  You opinions are important to me.    Thanks again for the rx's the last time that I saw you.  Question though.  You wrote the last rx for paxil for 20 instead of the 40, did you mean to change it.  Hope not because I have been doubling up on the 20s and I'm about out.  Do I need to make an appt with you?  Again thank you and Happy Easter!

## 2013-11-05 NOTE — Telephone Encounter (Signed)
Message left on vm 

## 2013-12-21 ENCOUNTER — Encounter: Payer: Self-pay | Admitting: Family Medicine

## 2013-12-21 ENCOUNTER — Ambulatory Visit (INDEPENDENT_AMBULATORY_CARE_PROVIDER_SITE_OTHER): Payer: No Typology Code available for payment source | Admitting: Family Medicine

## 2013-12-21 VITALS — BP 151/91 | HR 78 | Wt 186.0 lb

## 2013-12-21 DIAGNOSIS — E119 Type 2 diabetes mellitus without complications: Secondary | ICD-10-CM

## 2013-12-21 DIAGNOSIS — F329 Major depressive disorder, single episode, unspecified: Secondary | ICD-10-CM

## 2013-12-21 DIAGNOSIS — F3289 Other specified depressive episodes: Secondary | ICD-10-CM

## 2013-12-21 DIAGNOSIS — F419 Anxiety disorder, unspecified: Secondary | ICD-10-CM

## 2013-12-21 DIAGNOSIS — F32A Depression, unspecified: Secondary | ICD-10-CM

## 2013-12-21 DIAGNOSIS — F411 Generalized anxiety disorder: Secondary | ICD-10-CM

## 2013-12-21 MED ORDER — PAROXETINE HCL 40 MG PO TABS: 60.0000 mg | ORAL_TABLET | ORAL | Status: DC

## 2013-12-21 NOTE — Progress Notes (Signed)
CC: Seth Weeks is a 53 y.o. male is here for Depression   Subjective: HPI:  Followup depression and anxiety: States his symptoms have declined over the past 2 weeks. Described as irritability at work, difficulty staying asleep at night. Symptoms are directly correlated to micro-management at work with his supervisors. Symptoms are severe in severity while at work, mild to moderate at home. Nothing particularly makes it better other than Paxil however he believes that there is a great room for improvement.  denies thoughts of wanting to harm self or others. Symptoms are present daily basis. No alcohol or recreational drug use.   Followup type 2 diabetes: No outside blood sugars to report. Denies polyuria polyphasia polydipsia nor poorly healing wounds.  Review Of Systems Outlined In HPI  Past Medical History  Diagnosis Date  . Diabetes mellitus without complication   . High triglycerides   . Hypertension     Past Surgical History  Procedure Laterality Date  . Tonsillectomy     Family History  Problem Relation Age of Onset  . Heart failure Mother     History   Social History  . Marital Status: Single    Spouse Name: N/A    Number of Children: N/A  . Years of Education: N/A   Occupational History  . Not on file.   Social History Main Topics  . Smoking status: Never Smoker   . Smokeless tobacco: Not on file  . Alcohol Use: Yes  . Drug Use: No  . Sexual Activity: Not on file   Other Topics Concern  . Not on file   Social History Narrative  . No narrative on file     Objective: BP 151/91  Pulse 78  Wt 186 lb (84.369 kg)  Vital signs reviewed. General: Alert and Oriented, No Acute Distress HEENT: Pupils equal, round, reactive to light. Conjunctivae clear.  External ears unremarkable.  Moist mucous membranes. Lungs: Clear and comfortable work of breathing, speaking in full sentences without accessory muscle use. Cardiac: Regular rate and rhythm.  Neuro: CN  II-XII grossly intact, gait normal. Extremities: No peripheral edema.  Strong peripheral pulses.  Mental Status: Mild depression with mild agitation. No anxiety. Logical though process. Skin: Warm and dry.  Assessment & Plan: Seth Weeks was seen today for depression.  Diagnoses and associated orders for this visit:  Depression - PARoxetine (PAXIL) 40 MG tablet; Take 1.5 tablets (60 mg total) by mouth every morning. - Ambulatory referral to Psychology  Anxiety - Hemoglobin A1c - Ambulatory referral to Psychology  Type 2 diabetes mellitus     depression and anxiety: Uncontrolled chronic condition we discussed increasing Paxil over 40 mg a day is unlikely to her via a robust benefit however he would like to give it a try rather than risking side effects or deterioration of his symptoms with a new medication. He is in agreement to switch him to something else if 60 mg is not beneficial within 2 weeks. He expresses interest in psychology referral to discuss ways of helping manage work stress.  type 2 diabetes: Overdue for A1c continue metformin pending A1c today   Return in about 4 weeks (around 01/18/2014).

## 2013-12-22 LAB — HEMOGLOBIN A1C
HEMOGLOBIN A1C: 12.2 % — AB (ref <5.7)
Mean Plasma Glucose: 303 mg/dL — ABNORMAL HIGH (ref <117)

## 2013-12-25 ENCOUNTER — Telehealth: Payer: Self-pay | Admitting: Family Medicine

## 2013-12-25 DIAGNOSIS — E119 Type 2 diabetes mellitus without complications: Secondary | ICD-10-CM

## 2013-12-25 MED ORDER — METFORMIN HCL 1000 MG PO TABS: ORAL_TABLET | ORAL | Status: DC

## 2013-12-25 NOTE — Telephone Encounter (Signed)
Seth Weeks, Will you please let patient know that his A1c was 12 with a goal of less than 7.  I would expect that he'd be feeling off at an A1c this high.  I'd recommend he start full dose metformin at 1g twice a day which I've sent to his pharmacy.  We'll want to recheck this in 3 months.

## 2013-12-25 NOTE — Telephone Encounter (Signed)
Left message on vm

## 2014-01-18 ENCOUNTER — Telehealth: Payer: Self-pay | Admitting: *Deleted

## 2014-01-18 DIAGNOSIS — F329 Major depressive disorder, single episode, unspecified: Secondary | ICD-10-CM

## 2014-01-18 DIAGNOSIS — F32A Depression, unspecified: Secondary | ICD-10-CM

## 2014-01-18 MED ORDER — FLUOXETINE HCL 20 MG PO TABS: 20.0000 mg | ORAL_TABLET | Freq: Every day | ORAL | Status: DC

## 2014-01-18 NOTE — Telephone Encounter (Signed)
Seth Weeks re-ordering his psych referral, for some reason it's listed as a radiology referral in his chart now despite the correct order placed back in May.  Ask him to contact me if not contacted by behavorial health by next week.  Changing paroxetine to generic prozac which I've sent to his [facility].

## 2014-01-18 NOTE — Telephone Encounter (Signed)
Pt called and states the Paroxetine is not effective and wants to be switched to something else. I don't see that he has established with a psych.Please advise

## 2014-01-21 NOTE — Telephone Encounter (Signed)
Message left on number listed as home #

## 2014-03-01 ENCOUNTER — Encounter (HOSPITAL_COMMUNITY): Payer: Self-pay | Admitting: Professional Counselor

## 2014-03-01 ENCOUNTER — Ambulatory Visit (INDEPENDENT_AMBULATORY_CARE_PROVIDER_SITE_OTHER): Payer: Self-pay | Admitting: Professional Counselor

## 2014-03-01 ENCOUNTER — Ambulatory Visit (INDEPENDENT_AMBULATORY_CARE_PROVIDER_SITE_OTHER): Payer: Self-pay | Admitting: Family Medicine

## 2014-03-01 ENCOUNTER — Encounter: Payer: Self-pay | Admitting: Family Medicine

## 2014-03-01 VITALS — BP 149/91 | HR 81 | Wt 186.0 lb

## 2014-03-01 DIAGNOSIS — F32A Depression, unspecified: Secondary | ICD-10-CM

## 2014-03-01 DIAGNOSIS — F320 Major depressive disorder, single episode, mild: Secondary | ICD-10-CM

## 2014-03-01 DIAGNOSIS — R9431 Abnormal electrocardiogram [ECG] [EKG]: Secondary | ICD-10-CM

## 2014-03-01 DIAGNOSIS — F3289 Other specified depressive episodes: Secondary | ICD-10-CM

## 2014-03-01 DIAGNOSIS — Z01810 Encounter for preprocedural cardiovascular examination: Secondary | ICD-10-CM

## 2014-03-01 DIAGNOSIS — F329 Major depressive disorder, single episode, unspecified: Secondary | ICD-10-CM

## 2014-03-01 DIAGNOSIS — I10 Essential (primary) hypertension: Secondary | ICD-10-CM

## 2014-03-01 LAB — CBC
HEMATOCRIT: 43 % (ref 39.0–52.0)
HEMOGLOBIN: 14.5 g/dL (ref 13.0–17.0)
MCH: 27.7 pg (ref 26.0–34.0)
MCHC: 33.7 g/dL (ref 30.0–36.0)
MCV: 82.2 fL (ref 78.0–100.0)
Platelets: 173 10*3/uL (ref 150–400)
RBC: 5.23 MIL/uL (ref 4.22–5.81)
RDW: 14.6 % (ref 11.5–15.5)
WBC: 5.6 10*3/uL (ref 4.0–10.5)

## 2014-03-01 MED ORDER — FLUOXETINE HCL 20 MG PO TABS: 40.0000 mg | ORAL_TABLET | Freq: Every day | ORAL | Status: DC

## 2014-03-01 NOTE — Progress Notes (Signed)
CC: Seth Weeks is a 53 y.o. male is here for surgical clearance   Subjective: HPI:  Patient presents for preoperative clearance for umbilical hernia repair that scheduled for August 27 with Dr. Deno Etienne in [place]. Fax [phone].  He continues to have umbilical pain at the site of swelling is of mild severity worse with lifting. There's been no diarrhea, blood in stool, nausea or vomiting.   Patient reports no history of exertional chest pain. He's able to climb a flight of stairs without getting short of breath or having any chest discomfort. Denies limb claudication or rest pain. He can carry 75 pounds and walk down the hallway without any short breath or having any chest discomfort. He has no known cardiovascular disease. Denies fevers, chills, cough, wheezing, shortness of breath, nor any history of lung disease  His only complaint today is requesting reevaluation of his depression which is slightly improved on his current dose of fluoxetine, he admits there is room for improvement of having irritability and poor outlook on life most days of the week no thoughts of wanting to harm himself or others. He's beginning to see a counselor today.   Review Of Systems Outlined In HPI  Past Medical History  Diagnosis Date  . Diabetes mellitus without complication   . High triglycerides   . Hypertension     Past Surgical History  Procedure Laterality Date  . Tonsillectomy     Family History  Problem Relation Age of Onset  . Heart failure Mother     History   Social History  . Marital Status: Single    Spouse Name: N/A    Number of Children: N/A  . Years of Education: N/A   Occupational History  . Not on file.   Social History Main Topics  . Smoking status: Never Smoker   . Smokeless tobacco: Not on file  . Alcohol Use: Yes  . Drug Use: No  . Sexual Activity: Not on file   Other Topics Concern  . Not on file   Social History Narrative  . No narrative on  file     Objective: BP 149/91  Pulse 81  Wt 186 lb (84.369 kg)  General: Alert and Oriented, No Acute Distress HEENT: Pupils equal, round, reactive to light. Conjunctivae clear.  Moist membranes pharynx unremarkable Lungs: Clear to auscultation bilaterally, no wheezing/ronchi/rales.  Comfortable work of breathing. Good air movement. Cardiac: Regular rate and rhythm. Normal S1/S2.  No murmurs, rubs, nor gallops.  No carotid bruit Abdomen: Soft nontender, nontender umbilical hernia Extremities: No peripheral edema.  Strong peripheral pulses.  Mental Status: No depression, anxiety, nor agitation. Skin: Warm and dry.  Assessment & Plan: Elva was seen today for surgical clearance.  Diagnoses and associated orders for this visit:  Essential hypertension, benign - CBC - BASIC METABOLIC PANEL WITH GFR - PR ELECTROCARDIOGRAM, COMPLETE  Pre-operative cardiovascular examination - CBC - BASIC METABOLIC PANEL WITH GFR - PR ELECTROCARDIOGRAM, COMPLETE  Abnormal EKG - CBC - BASIC METABOLIC PANEL WITH GFR - PR ELECTROCARDIOGRAM, COMPLETE  Depression - FLUoxetine (PROZAC) 20 MG tablet; Take 2 tablets (40 mg total) by mouth daily.    Essential hypertension: Reiterated the importance of the DASH diet Depression: Uncontrolled increasing Prozac continue to see counselor  EKG was obtained showing normal sinus rhythm with left axis deviation and normal intervals. He has no pathologic Q waves or ST segment elevation or depression. There are T. wave inversions in the inferior and lateral leads however  based on a report from a EKG in January of 2014 this finding was also present. Of note troponins were being ordered during that day of hospitalization all of which were negative.  Checking metabolic panel and CBC to determine his candidacy for umbilical repair.   Return if symptoms worsen or fail to improve.
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
## 2014-03-02 LAB — BASIC METABOLIC PANEL WITH GFR
BUN: 15 mg/dL (ref 6–23)
CALCIUM: 8.9 mg/dL (ref 8.4–10.5)
CO2: 26 meq/L (ref 19–32)
CREATININE: 0.8 mg/dL (ref 0.50–1.35)
Chloride: 99 mEq/L (ref 96–112)
GFR, Est Non African American: >89 mL/min
GLUCOSE: 206 mg/dL — AB (ref 70–99)
Potassium: 4.4 mEq/L (ref 3.5–5.3)
SODIUM: 134 meq/L — AB (ref 135–145)

## 2014-03-15 ENCOUNTER — Ambulatory Visit (INDEPENDENT_AMBULATORY_CARE_PROVIDER_SITE_OTHER): Payer: Self-pay | Admitting: Family Medicine

## 2014-03-15 ENCOUNTER — Encounter: Payer: Self-pay | Admitting: Family Medicine

## 2014-03-15 VITALS — BP 155/93 | HR 86 | Wt 187.0 lb

## 2014-03-15 DIAGNOSIS — F3289 Other specified depressive episodes: Secondary | ICD-10-CM

## 2014-03-15 DIAGNOSIS — F329 Major depressive disorder, single episode, unspecified: Secondary | ICD-10-CM

## 2014-03-15 DIAGNOSIS — K429 Umbilical hernia without obstruction or gangrene: Secondary | ICD-10-CM

## 2014-03-15 DIAGNOSIS — F32A Depression, unspecified: Secondary | ICD-10-CM

## 2014-03-15 NOTE — Progress Notes (Signed)
CC: Seth Weeks is a 53 y.o. male is here for discuss hernia surgery   Subjective: HPI:  Followup depression: Continues to take Prozac on a daily basis, since increasing his dose at a recent visit he states that he said was a big improvement with dealing with stress and not allowing it to cause anxiety and depression. He feels he is more interested in life outside of work now and has a much better outlook on life in general. I stopped when harm self or others. Has no side effects to his current medication regimen  Followup umbilical hernia: He scheduled today's visit to discuss anticipated recovery from his surgery. It is still scheduled for the last week in August. He wants to know how much time he to take off from work, the only position he can fill requires him to carry up to 75 pounds frequently throughout the day.     Review Of Systems Outlined In HPI  Past Medical History  Diagnosis Date  . Diabetes mellitus without complication   . High triglycerides   . Hypertension     Past Surgical History  Procedure Laterality Date  . Tonsillectomy     Family History  Problem Relation Age of Onset  . Heart failure Mother     History   Social History  . Marital Status: Single    Spouse Name: N/A    Number of Children: N/A  . Years of Education: N/A   Occupational History  . Not on file.   Social History Main Topics  . Smoking status: Never Smoker   . Smokeless tobacco: Not on file  . Alcohol Use: Yes  . Drug Use: No  . Sexual Activity: Not on file   Other Topics Concern  . Not on file   Social History Narrative  . No narrative on file     Objective: BP 155/93  Pulse 86  Wt 187 lb (84.823 kg)  Vital signs reviewed. General: Alert and Oriented, No Acute Distress HEENT: Pupils equal, round, reactive to light. Conjunctivae clear.  External ears unremarkable.  Moist mucous membranes. Lungs: Clear and comfortable work of breathing, speaking in full sentences without  accessory muscle use. Cardiac: Regular rate and rhythm.  Neuro: CN Weeks-XII grossly intact, gait normal. Extremities: No peripheral edema.  Strong peripheral pulses.  Mental Status: No depression, anxiety, nor agitation. Logical though process. Skin: Warm and dry.  Assessment & Plan: [name] was seen today for discuss hernia surgery.  Diagnoses and associated orders for this visit:  Depression  Umbilical hernia without obstruction and without gangrene    Depression: Controlled continue Prozac Umbilical hernia: Time was taken to answer all of his questions regarding time off, I've recommended that he plan on somewhere between 2-6 weeks time off and that this will ultimately be dictated based on what his surgeon feels is appropriate.  We'll plan on writing him out for 6 weeks with the joint agreement that we can always allow him to go back sooner if he is back to 100% recovered.  Time was also taken to answer questions the best my ability regarding his flexible spending account.  25 minutes spent face-to-face during visit today of which at least 50% was counseling or coordinating care regarding: 1. Depression   2. Umbilical hernia without obstruction and without gangrene      Return if symptoms worsen or fail to improve.

## 2014-03-18 NOTE — Addendum Note (Signed)
Addended by: Narda Rutherford on: 03/18/2014 02:51 PM   Modules accepted: Orders

## 2014-03-25 ENCOUNTER — Ambulatory Visit (INDEPENDENT_AMBULATORY_CARE_PROVIDER_SITE_OTHER): Payer: Self-pay | Admitting: Family Medicine

## 2014-03-25 ENCOUNTER — Encounter: Payer: Self-pay | Admitting: Family Medicine

## 2014-03-25 VITALS — BP 185/105 | HR 99 | Wt 187.0 lb

## 2014-03-25 DIAGNOSIS — K429 Umbilical hernia without obstruction or gangrene: Secondary | ICD-10-CM

## 2014-03-25 NOTE — Progress Notes (Signed)
CC: Seth Weeks is a 53 y.o. male is here for discuss concerns   Subjective: HPI:  Complains of continued abdominal discomfort localized at the umbilicus that is worse when his hernia  is not reduced. He's able to reduce it easily by pressing on it however it will come back out within seconds. It's worse with heavy lifting or any other strenuous activity. Symptoms have not changed over the past month. He has surgery planned this week in [place] but wants to discuss whether or not he should have it done here locally. Finances and his insurance situation is prolonging his ability to have surgery done in a timely fashion. Denies nausea, vomiting, abdominal pain elsewhere, diarrhea, constipation, blood in stool nor melena   Review Of Systems Outlined In HPI  Past Medical History  Diagnosis Date  . Diabetes mellitus without complication   . High triglycerides   . Hypertension     Past Surgical History  Procedure Laterality Date  . Tonsillectomy     Family History  Problem Relation Age of Onset  . Heart failure Mother     History   Social History  . Marital Status: Single    Spouse Name: N/A    Number of Children: N/A  . Years of Education: N/A   Occupational History  . Not on file.   Social History Main Topics  . Smoking status: Never Smoker   . Smokeless tobacco: Not on file  . Alcohol Use: Yes  . Drug Use: No  . Sexual Activity: Not on file   Other Topics Concern  . Not on file   Social History Narrative  . No narrative on file     Objective: BP 185/105  Pulse 99  Wt 187 lb (84.823 kg)  Vital signs reviewed. General: Alert and Oriented, No Acute Distress HEENT: Pupils equal, round, reactive to light. Conjunctivae clear.  External ears unremarkable.  Moist mucous membranes. Lungs: Clear and comfortable work of breathing, speaking in full sentences without accessory muscle use. Cardiac: Regular rate and rhythm.  Neuro: Seth Weeks grossly intact, gait  normal. Extremities: No peripheral edema.  Strong peripheral pulses.  Mental Status: No depression, anxiety, nor agitation. Logical though process. Abdomen: Easily reducible umbilical hernia with approximately 1-2 cm abdominal wall defect Skin: Warm and dry. Assessment & Plan: [name] was seen today for discuss concerns.  Diagnoses and associated orders for this visit:  Umbilical hernia without obstruction and without gangrene    I counseled him that it is a personal matter on whether or not he should have hernia surgery done locally or in [place] however I told him that if I were in his position I would have it done locally.  He has an appointment with Gen. surgery later this afternoon. He had multiple questions regarding anticipated recovery time, time was taken to answer all his questions  25 minutes spent face-to-face during visit today of which at least 50% was counseling or coordinating care regarding: 1. Umbilical hernia without obstruction and without gangrene      Return if symptoms worsen or fail to improve.

## 2014-04-14 ENCOUNTER — Other Ambulatory Visit: Payer: Self-pay | Admitting: Family Medicine

## 2014-04-22 ENCOUNTER — Encounter: Payer: Self-pay | Admitting: Family Medicine

## 2014-04-22 ENCOUNTER — Ambulatory Visit (INDEPENDENT_AMBULATORY_CARE_PROVIDER_SITE_OTHER): Payer: BC Managed Care – PPO | Admitting: Family Medicine

## 2014-04-22 VITALS — BP 155/93 | HR 85 | Wt 186.0 lb

## 2014-04-22 DIAGNOSIS — B070 Plantar wart: Secondary | ICD-10-CM

## 2014-04-22 DIAGNOSIS — E119 Type 2 diabetes mellitus without complications: Secondary | ICD-10-CM

## 2014-04-22 MED ORDER — METFORMIN HCL 1000 MG PO TABS: ORAL_TABLET | ORAL | Status: DC

## 2014-04-22 NOTE — Progress Notes (Signed)
CC: Seth Weeks is a 53 y.o. male is here for left foot pain   Subjective: HPI:  Complains of left foot pain localized on the plantar aspect of the lateral forefoot. It's been present for a few months but most problematic over the last few weeks. It is described as a sharp sensation present only with weightbearing or putting pressure on the plantar surface of the foot. It is nonradiating. No interventions as of yet.  Overlying the site of pain there is a firm growth that he describes as a callus. He's never had this pain before. Denies any overlying skin changes other than that described above. Denies fevers, chills, new joint pain, nor skin changes elsewhere.  Requesting refills on metformin. No outside blood sugars to report denies polyuria polyphasia or polydipsia nor poorly healing wounds. Denies any diarrhea or intolerance to metformin   Review Of Systems Outlined In HPI  Past Medical History  Diagnosis Date  . Diabetes mellitus without complication   . High triglycerides   . Hypertension     Past Surgical History  Procedure Laterality Date  . Tonsillectomy     Family History  Problem Relation Age of Onset  . Heart failure Mother     History   Social History  . Marital Status: Single    Spouse Name: N/A    Number of Children: N/A  . Years of Education: N/A   Occupational History  . Not on file.   Social History Main Topics  . Smoking status: Never Smoker   . Smokeless tobacco: Not on file  . Alcohol Use: Yes  . Drug Use: No  . Sexual Activity: Not on file   Other Topics Concern  . Not on file   Social History Narrative  . No narrative on file     Objective: BP 155/93  Pulse 85  Wt 186 lb (84.369 kg)  General: Alert and Oriented, No Acute Distress HEENT: Pupils equal, round, reactive to light. Conjunctivae clear.  Moist mucous membranes pharynx unremarkable Lungs: Clear comfortable work of breathing Cardiac: Regular rate and rhythm.  Extremities:  No peripheral edema.  Strong peripheral pulses.  Mental Status: No depression, anxiety, nor agitation. Skin: Warm and dry. Plantar wart on the left lateral foot with hyperkeratosis forming a callus  Assessment & Plan: [name] was seen today for left foot pain.  Diagnoses and associated orders for this visit:  Type 2 diabetes mellitus without complication - metFORMIN (GLUCOPHAGE) 1000 MG tablet; TAKE ONE TABLET BY MOUTH TWICE DAILY FOR  BLOOD  SUGAR  CONTROL  Plantar wart    Type 2 diabetes: He declines A1c at this time, refills for metformin were provided Plantar wart: Time was taken to shave down the wart without complications he had immediate resolution of pain, discussed cryotherapy however he does not want to take a chance of risking worsening pain or blister formation and would rather wait and see if callus returns  25 minutes spent face-to-face during visit today of which at least 50% was counseling or coordinating care regarding: 1. Type 2 diabetes mellitus without complication   2. Plantar wart       Return if symptoms worsen or fail to improve.

## 2014-05-14 ENCOUNTER — Ambulatory Visit (INDEPENDENT_AMBULATORY_CARE_PROVIDER_SITE_OTHER): Payer: BC Managed Care – PPO | Admitting: Family Medicine

## 2014-05-14 ENCOUNTER — Encounter: Payer: Self-pay | Admitting: Family Medicine

## 2014-05-14 VITALS — BP 161/102 | HR 96 | Wt 193.0 lb

## 2014-05-14 DIAGNOSIS — K429 Umbilical hernia without obstruction or gangrene: Secondary | ICD-10-CM

## 2014-05-14 NOTE — Progress Notes (Signed)
CC: Seth Weeks is a 53 y.o. male is here for f/u hernia surgery   Subjective: HPI:  Followup umbilical hernia: He is approximately 6 weeks out from having his successful umbilical hernia repair. He tells me that over the past 4 days these were the only consecutive days where he had absolutely no pain in the umbilical region. He's currently not taking any pain medication. The most strenuous thing that he has done over the past 6 weeks involves cutting some small branches that had fallen near his property this weekend and he has not had any pain or discomfort from this. He scheduled to go back to work this coming Monday and he wants to know if this is safe.  He denies any gastrointestinal complaints. Is tolerating his normal diet and has had no constipation or diarrhea since I saw him last. He denies any overlying skin changes or discharge from the navel.  Review Of Systems Outlined In HPI  Past Medical History  Diagnosis Date  . Diabetes mellitus without complication   . High triglycerides   . Hypertension     Past Surgical History  Procedure Laterality Date  . Tonsillectomy     Family History  Problem Relation Age of Onset  . Heart failure Mother     History   Social History  . Marital Status: Single    Spouse Name: N/A    Number of Children: N/A  . Years of Education: N/A   Occupational History  . Not on file.   Social History Main Topics  . Smoking status: Never Smoker   . Smokeless tobacco: Not on file  . Alcohol Use: Yes  . Drug Use: No  . Sexual Activity: Not on file   Other Topics Concern  . Not on file   Social History Narrative  . No narrative on file     Objective: BP 161/102  Pulse 96  Wt 193 lb (87.544 kg)  Vital signs reviewed. General: Alert and Oriented, No Acute Distress HEENT: Pupils equal, round, reactive to light. Conjunctivae clear.  External ears unremarkable.  Moist mucous membranes. Lungs: Clear and comfortable work of breathing,  speaking in full sentences without accessory muscle use. Cardiac: Regular rate and rhythm.  Abdomen: Mild obesity soft and nontender with a clean dry intact and extremely well-healed surgical site at the navel. Extremities: No peripheral edema.  Strong peripheral pulses.  Mental Status: No depression, anxiety, nor agitation. Logical though process. Skin: Warm and dry.  Assessment & Plan: [name] was seen today for f/u hernia surgery.  Diagnoses and associated orders for this visit:  Umbilical hernia without obstruction and without gangrene    Umbilical hernia: Improved and resolved. We discussed a graded exercise plan to determine if he is fit to go back to work. This will include beginning with dumbbell exercises at a weight of 10 pounds in each hand doing repetitive lifting simulating any work responsibilities for 10 minutes straight. If this is painless for 24 hours gradually to 15 pounds in both hands, again a painless in 24 hours increased to 20 pounds in each hand. Again is painless in 24 hours graduate to 45 pound disc weight. If any navel pain occurs during this time did not advance to the next step. I would prefer that he proves that he can manipulate a 45 pound disc weight without any abdominal pain before he goes back to work.  25 minutes spent face-to-face during visit today of which at least 50% was counseling  or coordinating care regarding: 1. Umbilical hernia without obstruction and without gangrene      Return if symptoms worsen or fail to improve.

## 2014-05-17 ENCOUNTER — Telehealth: Payer: Self-pay | Admitting: Family Medicine

## 2014-05-17 NOTE — Telephone Encounter (Signed)
Seth Bake, Note in your inbox.

## 2014-05-17 NOTE — Telephone Encounter (Signed)
Mr. Seth Weeks called. He wants work note changed from 10/19 10/29. He did the exercise you recommended last night and stomach started hurting at the surgery site..he said that this was an indication to him he was not ready to go back in. He chose 10/29 because it would be less work hours for him and hopefully he would be off Saturday or Sunday. He wants to note asap Thank you.

## 2014-05-20 NOTE — Telephone Encounter (Signed)
Note up front

## 2014-05-24 ENCOUNTER — Ambulatory Visit: Payer: Self-pay | Admitting: Family Medicine

## 2014-05-30 ENCOUNTER — Encounter: Payer: Self-pay | Admitting: Sports Medicine

## 2014-05-30 ENCOUNTER — Ambulatory Visit (INDEPENDENT_AMBULATORY_CARE_PROVIDER_SITE_OTHER): Payer: BC Managed Care – PPO | Admitting: Sports Medicine

## 2014-05-30 VITALS — BP 153/93 | HR 106 | Wt 193.0 lb

## 2014-05-30 DIAGNOSIS — K429 Umbilical hernia without obstruction or gangrene: Secondary | ICD-10-CM

## 2014-05-30 LAB — COMPREHENSIVE METABOLIC PANEL
AST: 14 U/L (ref 0–37)
Albumin: 4.2 g/dL (ref 3.5–5.2)
BUN: 16 mg/dL (ref 6–23)
Calcium: 9 mg/dL (ref 8.4–10.5)
Chloride: 96 mEq/L (ref 96–112)
Potassium: 4.2 mEq/L (ref 3.5–5.3)
Total Protein: 6.8 g/dL (ref 6.0–8.3)

## 2014-05-30 LAB — CBC
HCT: 44.7 % (ref 39.0–52.0)
Hemoglobin: 15.6 g/dL (ref 13.0–17.0)
MCH: 28.3 pg (ref 26.0–34.0)
MCHC: 34.9 g/dL (ref 30.0–36.0)
MCV: 81.1 fL (ref 78.0–100.0)
Platelets: 191 K/uL (ref 150–400)
RBC: 5.51 MIL/uL (ref 4.22–5.81)
RDW: 14.4 % (ref 11.5–15.5)
WBC: 6.4 10*3/uL (ref 4.0–10.5)

## 2014-05-30 LAB — COMPREHENSIVE METABOLIC PANEL WITH GFR
ALT: 15 U/L (ref 0–53)
Alkaline Phosphatase: 77 U/L (ref 39–117)
CO2: 21 meq/L (ref 19–32)
Creat: 0.86 mg/dL (ref 0.50–1.35)
Glucose, Bld: 319 mg/dL — ABNORMAL HIGH (ref 70–99)
Sodium: 131 meq/L — ABNORMAL LOW (ref 135–145)
Total Bilirubin: 0.5 mg/dL (ref 0.2–1.2)

## 2014-05-30 LAB — TSH: TSH: 1.093 u[IU]/mL (ref 0.350–4.500)

## 2014-05-30 LAB — D-DIMER, QUANTITATIVE: D-Dimer, Quant: 0.27 ug{FEU}/mL (ref 0.00–0.48)

## 2014-05-30 NOTE — Progress Notes (Signed)
   Subjective:    I'm seeing this patient as a consultation for:  Dr. Ileene Rubens  CC: Umbilical pain  HPI: This is a very pleasant 53 year old male, he is approximately 8 weeks post umbilical herniorrhaphe. He has continued to have pain, worse with straining and Valsalva. He has decided to follow up with [place] rather than his surgeon. Pain is moderate, persistent, localized at the umbilicus, he does endorse occasional sweats and chills at night. No problems with bowel or bladder function. Moderate, persistent without radiation.  Past medical history, Surgical history, Family history not pertinant except as noted below, Social history, Allergies, and medications have been entered into the medical record, reviewed, and no changes needed.   Review of Systems: No headache, visual changes, nausea, vomiting, diarrhea, constipation, dizziness, abdominal pain, skin rash, fevers, chills, night sweats, weight loss, swollen lymph nodes, body aches, joint swelling, muscle aches, chest pain, shortness of breath, mood changes, visual or auditory hallucinations.   Objective:   General: Well Developed, well nourished, and in no acute distress.  Neuro/Psych: Alert and oriented x3, extra-ocular muscles intact, able to move all 4 extremities, sensation grossly intact. Skin: Warm and dry, no rashes noted.  Respiratory: Not using accessory muscles, speaking in full sentences, trachea midline.  Cardiovascular: Pulses palpable, no extremity edema. Abdomen: Does not appear distended. Soft, nontender, there is a palpable collection around the umbilicus. There is tenderness to palpation without erythema or induration or drainage.  Impression and Recommendations:   This case required medical decision making of moderate complexity.

## 2014-05-30 NOTE — Assessment & Plan Note (Addendum)
Approximate 8 weeks postop. Unfortunately with persistent pain at the umbilicus. Patient does desire to continue care here rather than with his surgeon. Office ultrasound shows what appears to be a collection and some linear structures near the incision, these are likely sutures. Since there is a collection I would like to obtain a CT scan with IV contrast with focus on the umbilicus. He is significantly fatigued, we will check some blood work including a d-dimer.  Surgical incisions can certainly be injected if persistent pain.

## 2014-05-31 ENCOUNTER — Telehealth: Payer: Self-pay | Admitting: *Deleted

## 2014-05-31 ENCOUNTER — Ambulatory Visit (INDEPENDENT_AMBULATORY_CARE_PROVIDER_SITE_OTHER): Payer: BC Managed Care – PPO

## 2014-05-31 DIAGNOSIS — K429 Umbilical hernia without obstruction or gangrene: Secondary | ICD-10-CM

## 2014-05-31 DIAGNOSIS — K760 Fatty (change of) liver, not elsewhere classified: Secondary | ICD-10-CM

## 2014-05-31 MED ORDER — IOHEXOL 300 MG/ML  SOLN
100.0000 mL | Freq: Once | INTRAMUSCULAR | Status: AC | PRN
  Administered 2014-05-31: 100 mL via INTRAVENOUS

## 2014-05-31 NOTE — Telephone Encounter (Signed)
No precert for ct [id number] as per [facility] @ [address] Radiology notified. Margette Fast, RMA

## 2014-06-12 ENCOUNTER — Encounter: Payer: Self-pay | Admitting: Family Medicine

## 2014-06-12 ENCOUNTER — Ambulatory Visit (INDEPENDENT_AMBULATORY_CARE_PROVIDER_SITE_OTHER): Payer: BC Managed Care – PPO | Admitting: Family Medicine

## 2014-06-12 VITALS — BP 148/91 | HR 117 | Wt 196.0 lb

## 2014-06-12 DIAGNOSIS — K429 Umbilical hernia without obstruction or gangrene: Secondary | ICD-10-CM

## 2014-06-12 NOTE — Progress Notes (Signed)
CC: Seth Weeks is a 53 y.o. male is here for f/u hernia   Subjective: HPI:  Reports continued abdominal pain localized at the umbilicus described as a burning sensation. It occurs with twisting motions and lasts seconds to minutes. Improves with rest. Nothing else particularly makes it better or worse. It is not related to cough, Valsalva, bowel movements, nor dietary habits. Has not been back to the gym due to fears that something is wrong with the surgical site. Since I saw him last he had a CT scan which was reassuring.  He denies any nausea, vomiting, fevers, chills, nor genitourinary complaints  Review Of Systems Outlined In HPI  Past Medical History  Diagnosis Date  . Diabetes mellitus without complication   . High triglycerides   . Hypertension     Past Surgical History  Procedure Laterality Date  . Tonsillectomy     Family History  Problem Relation Age of Onset  . Heart failure Mother     History   Social History  . Marital Status: Single    Spouse Name: N/A    Number of Children: N/A  . Years of Education: N/A   Occupational History  . Not on file.   Social History Main Topics  . Smoking status: Never Smoker   . Smokeless tobacco: Not on file  . Alcohol Use: Yes  . Drug Use: No  . Sexual Activity: Not on file   Other Topics Concern  . Not on file   Social History Narrative     Objective: BP 148/91 mmHg  Pulse 117  Wt 196 lb (88.905 kg)  General: Alert and Oriented, No Acute Distress HEENT: Pupils equal, round, reactive to light. Conjunctivae clear.  Moist mucous membranes pharynx unremarkable Lungs: Clear to auscultation bilaterally, no wheezing/ronchi/rales.  Comfortable work of breathing. Good air movement. Cardiac: Regular rate and rhythm. Normal S1/S2.  No murmurs, rubs, nor gallops.   Abdomen: obese, soft, externally his surgical site appears fully healed. No palpable abdominal wall defect palpation at the umbilicus. No pain with palpation of  the umbilicus Extremities: No peripheral edema.  Strong peripheral pulses.  Mental Status: No depression, anxiety, nor agitation. Skin: Warm and dry.  Assessment & Plan: Seth Weeks was seen today for f/u hernia.  Diagnoses and associated orders for this visit:  Umbilical hernia without obstruction and without gangrene    Reassurance provided that it appears his umbilical repair has fully healed. I think it's safe for him to go back to the gym for a gradual test whether or not he is fit to go back to work. I've asked him to slowly work his way up to carrying a 45 pound dumbbell doing similar maneuvers that he would be doing at work. I've asked him to pace himself over the next 3 weeks to reach this point starting with 15 pounds.   Return in about 3 weeks (around 07/03/2014), or if symptoms worsen or fail to improve.

## 2014-06-24 ENCOUNTER — Telehealth: Payer: Self-pay | Admitting: Family Medicine

## 2014-06-24 NOTE — Telephone Encounter (Signed)
Care everywhere 

## 2014-07-01 ENCOUNTER — Ambulatory Visit (INDEPENDENT_AMBULATORY_CARE_PROVIDER_SITE_OTHER): Payer: BC Managed Care – PPO | Admitting: Family Medicine

## 2014-07-01 ENCOUNTER — Encounter: Payer: Self-pay | Admitting: Family Medicine

## 2014-07-01 VITALS — BP 168/92 | HR 106 | Wt 193.0 lb

## 2014-07-01 DIAGNOSIS — F32A Depression, unspecified: Secondary | ICD-10-CM

## 2014-07-01 DIAGNOSIS — F329 Major depressive disorder, single episode, unspecified: Secondary | ICD-10-CM

## 2014-07-01 DIAGNOSIS — K429 Umbilical hernia without obstruction or gangrene: Secondary | ICD-10-CM | POA: Diagnosis not present

## 2014-07-01 NOTE — Progress Notes (Signed)
CC: Seth Weeks is a 53 y.o. male is here for hernia repair f/u   Subjective: HPI:  Follow-up umbilical hernia: Patient reports pain is greatly improved now only described as a mild burning sensation that doesn't grab his attention much anymore. Nothing particularly makes it worse but it does improve the more he ignores it. No interventions as of yet. He's not had any return of his pain with any maneuvers at the gym. Denies any gastrointestinal complaints. There's been no diarrhea constipation nausea nor vomiting. Burning is localized at the surgical site below the umbilicus.  He's having a hard time with his depression due to the recent news that his cat has mouth cancer and is probably malignant. There's been no thoughts of wanting to harm himself or others but he's been much more emotional thinking that he doesn't have the money to provide a surgical procedure that's needed to save her life.    Review Of Systems Outlined In HPI  Past Medical History  Diagnosis Date  . Diabetes mellitus without complication   . High triglycerides   . Hypertension     Past Surgical History  Procedure Laterality Date  . Tonsillectomy     Family History  Problem Relation Age of Onset  . Heart failure Mother     History   Social History  . Marital Status: Single    Spouse Name: N/A    Number of Children: N/A  . Years of Education: N/A   Occupational History  . Not on file.   Social History Main Topics  . Smoking status: Never Smoker   . Smokeless tobacco: Not on file  . Alcohol Use: Yes  . Drug Use: No  . Sexual Activity: Not on file   Other Topics Concern  . Not on file   Social History Narrative     Objective: BP 168/92 mmHg  Pulse 106  Wt 193 lb (87.544 kg)  Vital signs reviewed. General: Alert and Oriented, No Acute Distress HEENT: Pupils equal, round, reactive to light. Conjunctivae clear.  External ears unremarkable.  Moist mucous membranes. Lungs: Clear and  comfortable work of breathing, speaking in full sentences without accessory muscle use. Cardiac: Regular rate and rhythm.  Abdomen: Obese and soft with no palpable abnormality at the umbilicus nor any reproduction of pain with palpation of the umbilicus Extremities: No peripheral edema.  Strong peripheral pulses.  Mental Status: No anxiety, nor agitation. Mild depression.Logical though process. Skin: Warm and dry.  Assessment & Plan: Seth Weeks was seen today for hernia repair f/u.  Diagnoses and associated orders for this visit:  Depression  Umbilical hernia without obstruction and without gangrene   umbilical hernia: Improved I feel that he is healthy enough to go back to work next week a work note was provided. Depression: Worsening due to decline in his cat's health, we discussed ways of trying to help comfort the cat which will then provide him with a a sense  That he is making a meaningful intervention which ultimately should help with his depression. For documentation purposes I'm noting that I gave him two empty syringes without needles so that he can use these to help feed his cat A liquids only diet.   Return if symptoms worsen or fail to improve.

## 2014-09-20 ENCOUNTER — Ambulatory Visit: Payer: Self-pay | Admitting: Family Medicine

## 2014-09-27 ENCOUNTER — Encounter: Payer: Self-pay | Admitting: Family Medicine

## 2014-09-27 ENCOUNTER — Ambulatory Visit (INDEPENDENT_AMBULATORY_CARE_PROVIDER_SITE_OTHER): Payer: Self-pay | Admitting: Family Medicine

## 2014-09-27 VITALS — BP 158/90 | HR 91 | Temp 98.3°F | Wt 194.0 lb

## 2014-09-27 DIAGNOSIS — F329 Major depressive disorder, single episode, unspecified: Secondary | ICD-10-CM

## 2014-09-27 DIAGNOSIS — K047 Periapical abscess without sinus: Secondary | ICD-10-CM

## 2014-09-27 DIAGNOSIS — F32A Depression, unspecified: Secondary | ICD-10-CM

## 2014-09-27 MED ORDER — METRONIDAZOLE 500 MG PO TABS: 500.0000 mg | ORAL_TABLET | Freq: Three times a day (TID) | ORAL | Status: DC

## 2014-09-27 MED ORDER — FLUOXETINE HCL 20 MG PO TABS: 40.0000 mg | ORAL_TABLET | Freq: Every day | ORAL | Status: DC

## 2014-09-27 NOTE — Progress Notes (Signed)
CC: Seth Weeks is a 54 y.o. male is here for URI   Subjective: HPI:  Depression: He continues to take 40 mg of Prozac on a daily basis. Symptoms have worsened over the past week after he had 2 put  his cat to sleep due to her declining state from metastatic oral cancer. He's been tearful at home and has had a loss of interest in pleasurable activities. He's been much more irritable towards others. He is constantly been thinking and missing the presence of his cat. He reports his symptoms as moderate in severity present all hours of the day somewhat interfering with sleep. There have been no thoughts 1 to harm self or others. He denies any anxiety or mental disturbance other than that described above nor side effects from Prozac.  Complains of dental pain localized on the lower right middle molar. This been present for the past 2 or 3 days and radiates into the right ear. Worse with chewing on the right side of the mouth. He has a dental appointment scheduled for Monday. Denies fevers, chills, neck pain, neck swelling, difficulty swallowing, nor sore throat.   Review Of Systems Outlined In HPI  Past Medical History  Diagnosis Date  . Diabetes mellitus without complication   . High triglycerides   . Hypertension     Past Surgical History  Procedure Laterality Date  . Tonsillectomy     Family History  Problem Relation Age of Onset  . Heart failure Mother     History   Social History  . Marital Status: Single    Spouse Name: N/A  . Number of Children: N/A  . Years of Education: N/A   Occupational History  . Not on file.   Social History Main Topics  . Smoking status: Never Smoker   . Smokeless tobacco: Not on file  . Alcohol Use: Yes  . Drug Use: No  . Sexual Activity: Not on file   Other Topics Concern  . Not on file   Social History Narrative     Objective: BP 158/90 mmHg  Pulse 91  Temp(Src) 98.3 F (36.8 C) (Oral)  Wt 194 lb (87.998 kg)  General: Alert and  Oriented, No Acute Distress HEENT: Pupils equal, round, reactive to light. Conjunctivae clear.  External ears unremarkable, canals clear with intact TMs with appropriate landmarks.  Middle ear appears open without effusion. Pink inferior turbinates.  Moist mucous membranes, pharynx without inflammation nor lesions.  Neck supple without palpable lymphadenopathy nor abnormal masses. Inferior right middle molar tender to touch no discharge from the gumline no visual abnormalities with the gumline Extremities: No peripheral edema.  Strong peripheral pulses.  Mental Status: Mild depression. No anxiety, nor agitation. Skin: Warm and dry.  Assessment & Plan: Seth Weeks was seen today for uri.  Diagnoses and all orders for this visit:  Depression Orders: -     FLUoxetine (PROZAC) 20 MG tablet; Take 2 tablets (40 mg total) by mouth daily.  Dental infection  Other orders -     metroNIDAZOLE (FLAGYL) 500 MG tablet; Take 1 tablet (500 mg total) by mouth 3 (three) times daily.   Depression: Worsening, time was taken to provide counseling regarding the normal grieving process of a loved one. Discussed the benefits of focusing on his spirituality and that he did the right thing to put his cat out of misery. Discussed that it be best for him to talk out his feelings with others who were willing to listen. Joint decision noted  need to change his dose of Prozac. Dental infection: He is allergic to clindamycin-like products and penicillin-like products, will attempt metronidazole for cure however keep dental appointment on Monday  25 minutes spent face-to-face during visit today of which at least 50% was counseling or coordinating care regarding: 1. Depression   2. Dental infection      Return if symptoms worsen or fail to improve.

## 2014-11-18 ENCOUNTER — Encounter: Payer: Self-pay | Admitting: Family Medicine

## 2014-11-18 ENCOUNTER — Ambulatory Visit (INDEPENDENT_AMBULATORY_CARE_PROVIDER_SITE_OTHER): Payer: Self-pay | Admitting: Family Medicine

## 2014-11-18 VITALS — BP 159/96 | HR 80 | Wt 188.0 lb

## 2014-11-18 DIAGNOSIS — K2100 Gastro-esophageal reflux disease with esophagitis, without bleeding: Secondary | ICD-10-CM

## 2014-11-18 DIAGNOSIS — R197 Diarrhea, unspecified: Secondary | ICD-10-CM

## 2014-11-18 MED ORDER — DIPHENOXYLATE-ATROPINE 2.5-0.025 MG PO TABS: 1.0000 | ORAL_TABLET | Freq: Four times a day (QID) | ORAL | Status: DC | PRN

## 2014-11-18 NOTE — Progress Notes (Signed)
CC: Seth Weeks is a 54 y.o. male is here for Diarrhea   Subjective: HPI:  Complains of loose stools that occur 1-2 times a week. He's not been able to identify anything that makes this that her or worse. Symptoms are worse if he ingests caffeine. He's tried Imodium, fiber, and even took MiraLAX however nothing seemed to help her worsen his diarrhea. He describes episodes as urgency to defecate and unable to hold his bowels for more than 15 minutes after the initial urge hits him. This will happen a couple times in a 24 hour period and will resolve without any particular intervention. He denies abdominal pain back pain, fevers nor chills. No unintentional weight loss. No other gastrointestinal complaints. This been going on for a few months now.  He forgot to take lactase for 4 days and on the fourth day drank half a gallon of milk without any GI symptoms whatsoever.  Complains of having an episode 1-2 times a year where it he's eating solids too fast it'll feel like it gets stuck in the base of his neck. Symptoms will improve if he can drink water quickly. Some discomfort last up to a minute depending on how soon he has access to liquids. He denies regurgitation or throwing up. He denies any known reflux or epigastric discomfort. He does have a daily cough.   Review Of Systems Outlined In HPI  Past Medical History  Diagnosis Date  . Diabetes mellitus without complication   . High triglycerides   . Hypertension     Past Surgical History  Procedure Laterality Date  . Tonsillectomy     Family History  Problem Relation Age of Onset  . Heart failure Mother     History   Social History  . Marital Status: Single    Spouse Name: N/A  . Number of Children: N/A  . Years of Education: N/A   Occupational History  . Not on file.   Social History Main Topics  . Smoking status: Never Smoker   . Smokeless tobacco: Not on file  . Alcohol Use: Yes  . Drug Use: No  . Sexual Activity: Not on  file   Other Topics Concern  . Not on file   Social History Narrative     Objective: BP 159/96 mmHg  Pulse 80  Wt 188 lb (85.276 kg)  Vital signs reviewed. General: Alert and Oriented, No Acute Distress HEENT: Pupils equal, round, reactive to light. Conjunctivae clear.  External ears unremarkable.  Moist mucous membranes. Pharynx unremarkable Lungs: Clear and comfortable work of breathing, speaking in full sentences without accessory muscle use. Cardiac: Regular rate and rhythm.  Neuro: CN II-XII grossly intact, gait normal. Extremities: No peripheral edema.  Strong peripheral pulses.  Mental Status: No depression, anxiety, nor agitation. Logical though process. Skin: Warm and dry.  Assessment & Plan: Edem was seen today for diarrhea.  Diagnoses and all orders for this visit:  Gastroesophageal reflux disease with esophagitis  Diarrhea Orders: -     diphenoxylate-atropine (LOMOTIL) 2.5-0.025 MG per tablet; Take 1 tablet by mouth 4 (four) times daily as needed for diarrhea or loose stools.   GERD: Discussed that his dysphagia is a common symptom of uncontrolled reflux and although he is not expense any other symptoms we can minimize these episodes by starting a proton pump inhibitor or H2 blocker. He politely declines at this time. Diarrhea: Start Lomotil, so does not sound infectious and most likely represents irritable bowel syndrome  25 minutes  spent face-to-face during visit today of which at least 50% was counseling or coordinating care regarding: 1. Gastroesophageal reflux disease with esophagitis   2. Diarrhea      Return if symptoms worsen or fail to improve.

## 2014-11-19 ENCOUNTER — Ambulatory Visit: Payer: Self-pay | Admitting: Family Medicine

## 2014-12-25 ENCOUNTER — Other Ambulatory Visit: Payer: Self-pay | Admitting: Family Medicine

## 2014-12-26 ENCOUNTER — Other Ambulatory Visit: Payer: Self-pay | Admitting: Family Medicine

## 2015-01-04 ENCOUNTER — Encounter: Payer: Self-pay | Admitting: Emergency Medicine

## 2015-01-04 ENCOUNTER — Emergency Department (INDEPENDENT_AMBULATORY_CARE_PROVIDER_SITE_OTHER): Payer: Self-pay

## 2015-01-04 ENCOUNTER — Emergency Department (INDEPENDENT_AMBULATORY_CARE_PROVIDER_SITE_OTHER)
Admission: EM | Admit: 2015-01-04 | Discharge: 2015-01-04 | Disposition: A | Discharge status: Home / Self Care | Payer: BLUE CROSS/BLUE SHIELD | Source: Home / Self Care | Attending: Sports Medicine | Admitting: Sports Medicine

## 2015-01-04 DIAGNOSIS — M25572 Pain in left ankle and joints of left foot: Secondary | ICD-10-CM

## 2015-01-04 DIAGNOSIS — M79672 Pain in left foot: Secondary | ICD-10-CM | POA: Diagnosis not present

## 2015-01-04 MED ORDER — COLCHICINE 0.6 MG PO CAPS: 1.0000 | ORAL_CAPSULE | Freq: Every day | ORAL | Status: DC

## 2015-01-04 MED ORDER — MELOXICAM 15 MG PO TABS: ORAL_TABLET | ORAL | Status: DC

## 2015-01-04 NOTE — ED Provider Notes (Addendum)
  Subjective:    CC: Left foot pain  HPI: This is a pleasant 54 year old male, for the past 4 days she's had increasing pain in his left fifth distal interphalangeal joint, without known trauma. Pain is moderate, persistent without radiation. Denies any changes in diet, no constitutional symptoms. No history of gout and no similar occurrences of this and other joints.  Past medical history, Surgical history, Family history not pertinant except as noted below, Social history, Allergies, and medications have been entered into the medical record, reviewed, and no changes needed.   Review of Systems: No fevers, chills, night sweats, weight loss, chest pain, or shortness of breath.   Objective:    General: Well Developed, well nourished, and in no acute distress.  Neuro: Alert and oriented x3, extra-ocular muscles intact, sensation grossly intact.  HEENT: Normocephalic, atraumatic, pupils equal round reactive to light, neck supple, no masses, no lymphadenopathy, thyroid nonpalpable.  Skin: Warm and dry, no rashes. Cardiac: Regular rate and rhythm, no murmurs rubs or gallops, no lower extremity edema.  Respiratory: Clear to auscultation bilaterally. Not using accessory muscles, speaking in full sentences. Left Foot: No visible erythema or swelling. Range of motion is full in all directions. Strength is 5/5 in all directions. No hallux valgus. No pes cavus or pes planus. No abnormal callus noted. No pain over the navicular prominence, or base of fifth metatarsal. No tenderness to palpation of the calcaneal insertion of plantar fascia. No pain at the Achilles insertion. No pain over the calcaneal bursa. No pain of the retrocalcaneal bursa. No tenderness to palpation over the tarsals, metatarsals, or phalanges. No hallux rigidus or limitus. Pain and swelling without erythema at the left fourth and fifth distal interphalangeal joint. No pain with compression of the metatarsal  heads. Neurovascularly intact distally.  X-rays reviewed and are without abnormalities.  Impression and Recommendations:    1. 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  Return to see me in 2 weeks for further evaluation and treatment based off of lab results. He will probably also come back for custom orthotics.  ___________________________________________ Gwen Her. Dianah Field, M.D., ABFM., CAQSM. Primary Care and [place] [employer] [facility]  Silverio Decamp, MD 01/04/15 [place], MD 01/04/15 [phone]

## 2015-01-04 NOTE — ED Notes (Addendum)
Pt c/o left 4th and 5th toe pain x days. Pt is unaware of any injury.

## 2015-01-05 LAB — COMPREHENSIVE METABOLIC PANEL
Albumin: 3.8 g/dL (ref 3.5–5.2)
BUN: 20 mg/dL (ref 6–23)
CO2: 25 mEq/L (ref 19–32)
Creat: 0.93 mg/dL (ref 0.50–1.35)
Glucose, Bld: 402 mg/dL — ABNORMAL HIGH (ref 70–99)
Potassium: 4.1 mEq/L (ref 3.5–5.3)
Sodium: 131 mEq/L — ABNORMAL LOW (ref 135–145)
Total Protein: 6.6 g/dL (ref 6.0–8.3)

## 2015-01-05 LAB — COMPREHENSIVE METABOLIC PANEL WITH GFR
ALT: 18 U/L (ref 0–53)
AST: 12 U/L (ref 0–37)
Alkaline Phosphatase: 82 U/L (ref 39–117)
Calcium: 8.6 mg/dL (ref 8.4–10.5)
Chloride: 98 meq/L (ref 96–112)
Total Bilirubin: 0.3 mg/dL (ref 0.2–1.2)

## 2015-01-05 LAB — URIC ACID: Uric Acid, Serum: 5.6 mg/dL (ref 4.0–7.8)

## 2015-01-06 ENCOUNTER — Encounter: Payer: Self-pay | Admitting: *Deleted

## 2015-01-06 ENCOUNTER — Emergency Department (INDEPENDENT_AMBULATORY_CARE_PROVIDER_SITE_OTHER)
Admission: EM | Admit: 2015-01-06 | Discharge: 2015-01-06 | Disposition: A | Discharge status: Home / Self Care | Payer: BLUE CROSS/BLUE SHIELD | Source: Home / Self Care | Attending: Family Medicine | Admitting: Family Medicine

## 2015-01-06 DIAGNOSIS — F411 Generalized anxiety disorder: Secondary | ICD-10-CM

## 2015-01-06 MED ORDER — LORAZEPAM 0.5 MG PO TABS: ORAL_TABLET | ORAL | Status: DC

## 2015-01-06 NOTE — ED Notes (Signed)
Pt c/o increased stress and anxiety x today.

## 2015-01-06 NOTE — Discharge Instructions (Signed)
Generalized Anxiety Disorder Generalized anxiety disorder (GAD) is a mental disorder. It interferes with life functions, including relationships, work, and school. GAD is different from normal anxiety, which everyone experiences at some point in their lives in response to specific life events and activities. Normal anxiety actually helps us prepare for and get through these life events and activities. Normal anxiety goes away after the event or activity is over.  GAD causes anxiety that is not necessarily related to specific events or activities. It also causes excess anxiety in proportion to specific events or activities. The anxiety associated with GAD is also difficult to control. GAD can vary from mild to severe. People with severe GAD can have intense waves of anxiety with physical symptoms (panic attacks).  SYMPTOMS The anxiety and worry associated with GAD are difficult to control. This anxiety and worry are related to many life events and activities and also occur more days than not for 6 months or longer. People with GAD also have three or more of the following symptoms (one or more in children):  Restlessness.   Fatigue.  Difficulty concentrating.   Irritability.  Muscle tension.  Difficulty sleeping or unsatisfying sleep. DIAGNOSIS GAD is diagnosed through an assessment by your health care provider. Your health care provider will ask you questions aboutyour mood,physical symptoms, and events in your life. Your health care provider may ask you about your medical history and use of alcohol or drugs, including prescription medicines. Your health care provider may also do a physical exam and blood tests. Certain medical conditions and the use of certain substances can cause symptoms similar to those associated with GAD. Your health care provider may refer you to a mental health specialist for further evaluation. TREATMENT The following therapies are usually used to treat GAD:    Medication. Antidepressant medication usually is prescribed for long-term daily control. Antianxiety medicines may be added in severe cases, especially when panic attacks occur.   Talk therapy (psychotherapy). Certain types of talk therapy can be helpful in treating GAD by providing support, education, and guidance. A form of talk therapy called cognitive behavioral therapy can teach you healthy ways to think about and react to daily life events and activities.  Stress managementtechniques. These include yoga, meditation, and exercise and can be very helpful when they are practiced regularly. A mental health specialist can help determine which treatment is best for you. Some people see improvement with one therapy. However, other people require a combination of therapies. Document Released: 11/13/2012 Document Revised: 12/03/2013 Document Reviewed: 11/13/2012 [employer] Patient Information 2015 [employer]. This information is not intended to replace advice given to you by your health care provider. Make sure you discuss any questions you have with your health care provider.  

## 2015-01-06 NOTE — ED Provider Notes (Signed)
CSN: [id number]     Arrival date & time 01/06/15  1617 History   First MD Initiated Contact with Patient 01/06/15 1710     Chief Complaint  Patient presents with  . Anxiety      HPI Comments: Patient has a history of depression and anxiety reasonably controlled with fluoxetine 30m daily.  Recently he has been dealing with increased stress at his workplace, and his relationship with his girlfriend and her daughter.  Today his anger and frustrations reached a critical level and he decided to leave work and seek medical care.  He has had difficulty concentrating and focusing today as a consequence of his anxiety.  He denies suicidal or homicidal ideation.  He notes chronic difficulty sleeping.  Patient is a 54y.o. male presenting with anxiety. The history is provided by the patient.  Anxiety This is a recurrent problem. Episode onset: today. The problem occurs constantly. The problem has been rapidly worsening. Pertinent negatives include no chest pain and no shortness of breath. The symptoms are aggravated by stress. Nothing relieves the symptoms. He has tried nothing for the symptoms.    Past Medical History  Diagnosis Date  . Diabetes mellitus without complication   . High triglycerides   . Hypertension    Past Surgical History  Procedure Laterality Date  . Tonsillectomy     Family History  Problem Relation Age of Onset  . Heart failure Mother    History  Substance Use Topics  . Smoking status: Never Smoker   . Smokeless tobacco: Not on file  . Alcohol Use: Yes    Review of Systems  Constitutional: Negative.   Respiratory: Negative for shortness of breath.   Cardiovascular: Negative for chest pain.  Psychiatric/Behavioral: Positive for sleep disturbance and decreased concentration. Negative for suicidal ideas and self-injury. The patient is nervous/anxious.   All other systems reviewed and are negative.   Allergies  Penicillins and Terramycin  Home Medications   Prior  to Admission medications   Medication Sig Start Date End Date Taking? Authorizing Provider  AMBULATORY NON FORMULARY MEDICATION [employer] HEPA Air Filter: HPA 300 Model with filters to last one year.  Use daily for control of seasonal allergies. 10/19/13   Sean Hommel, DO  AMBULATORY NON FORMULARY MEDICATION Hydrogen Peroxide: use to clean cuts as needed. 10/19/13   SMarcial Pacas DO  Blood Glucose Monitoring Suppl (ONE TOUCH ULTRA SYSTEM KIT) W/DEVICE KIT Use to check blood sugar as needed 05/25/13   SMarcial Pacas DO  Colchicine (MITIGARE) 0.6 MG CAPS Take 1 capsule by mouth daily. 01/04/15   TSilverio Decamp MD  diphenoxylate-atropine (LOMOTIL) 2.5-0.025 MG per tablet Take 1 tablet by mouth 4 (four) times daily as needed for diarrhea or loose stools. 11/18/14   Sean Hommel, DO  FLUoxetine (PROZAC) 20 MG tablet Take 2 tablets (40 mg total) by mouth daily. 09/27/14   SMarcial Pacas DO  hydrogen peroxide 3 % external solution Use to clean cuts as needed. 12/27/14   SMarcial Pacas DO  hyoscyamine (LEVSIN, ANASPAZ) 0.125 MG tablet Take one by mouth every 6 hours as needed for abdominal cramping or pain 06/19/13   DJacqulyn Cane MD  LACTAID 3000 UNITS tablet TAKE ONE TABLET BY MOUTH THREE TIMES DAILY AS NEEDED 12/27/14   SMarcial Pacas DO  loperamide (IMODIUM A-D) 2 MG tablet Take 1 tablet (2 mg total) by mouth 4 (four) times daily as needed for diarrhea or loose stools. 10/19/13   SMarcial Pacas DO  LORazepam (ATIVAN)  0.5 MG tablet Take one tab PO, one to three times daily prn stress and anxiety 01/06/15   Kandra Nicolas, MD  meloxicam (MOBIC) 15 MG tablet One tab PO qAM with breakfast for 2 weeks, then daily prn pain. 01/04/15   Silverio Decamp, MD  metFORMIN (GLUCOPHAGE) 1000 MG tablet TAKE ONE TABLET BY MOUTH TWICE DAILY FOR  BLOOD  SUGAR  CONTROL 04/22/14   Marcial Pacas, DO  Neomycin-Bacitracin-Polymyxin (EQ TRIPLE ANTIBIOTIC) [phone] OINT APPLY EXTERNALLY AS NEEDED TWICE DAILY ON CUTS. 12/27/14   Marcial Pacas,  DO  tadalafil (CIALIS) 5 MG tablet Take 1 tablet (5 mg total) by mouth daily as needed for erectile dysfunction. 05/17/13   Janeann Forehand, MD  traMADol (ULTRAM) 50 MG tablet Take 1 tablet (50 mg total) by mouth every 6 (six) hours as needed. 06/14/13   Sean Hommel, DO   BP 186/101 mmHg  Pulse 111  Temp(Src) 98.2 F (36.8 C) (Oral)  Resp 18  Ht 5' 8.5" (1.74 m)  Wt 193 lb (87.544 kg)  BMI 28.92 kg/m2  SpO2 96% Physical Exam Nursing notes and Vital Signs reviewed. Appearance:  Patient appears stated age, and in no acute distress Psychiatric:  Patient is alert and oriented with good eye contact.  Thoughts are organized.  No psychomotor retardation.  Memory intact.  Not suicidal.  Mood is generally euthymic.  Affect is flat. Eyes:  Pupils are equal, round, and reactive to light and accomodation.  Extraocular movement is intact.  Conjunctivae are not inflamed  Lungs:  Clear to auscultation.  Breath sounds are equal.  Heart:  Regular rate and rhythm without murmurs, rubs, or gallops.  Abdomen:  Nontender   Skin:  No rash present.   ED Course  Procedures  none   MDM   1. Anxiety state    Begin trial of lorazepam 0.96m, one tab daily up to TID prn anxiety; #15, no refill.  Continue fluoxetine.  Followup with Family Doctor in one week.  Consider referral for counselling.    SKandra Nicolas MD 01/06/15 [phone]

## 2015-01-14 ENCOUNTER — Encounter: Payer: Self-pay | Admitting: Family Medicine

## 2015-01-14 ENCOUNTER — Ambulatory Visit (INDEPENDENT_AMBULATORY_CARE_PROVIDER_SITE_OTHER): Payer: BLUE CROSS/BLUE SHIELD | Admitting: Family Medicine

## 2015-01-14 VITALS — BP 175/98 | HR 89 | Wt 189.0 lb

## 2015-01-14 DIAGNOSIS — F411 Generalized anxiety disorder: Secondary | ICD-10-CM | POA: Diagnosis not present

## 2015-01-14 DIAGNOSIS — M79676 Pain in unspecified toe(s): Secondary | ICD-10-CM | POA: Diagnosis not present

## 2015-01-14 DIAGNOSIS — N529 Male erectile dysfunction, unspecified: Secondary | ICD-10-CM

## 2015-01-14 MED ORDER — SILDENAFIL CITRATE 100 MG PO TABS: 50.0000 mg | ORAL_TABLET | Freq: Every day | ORAL | Status: DC | PRN

## 2015-01-14 MED ORDER — CLONAZEPAM 0.5 MG PO TABS: 0.2500 mg | ORAL_TABLET | Freq: Two times a day (BID) | ORAL | Status: DC | PRN

## 2015-01-14 NOTE — Progress Notes (Signed)
CC: Seth Weeks is a 54 y.o. male is here for Anxiety   Subjective: HPI:  Ms. of irritability and anxiety described only further as nervousness that has been present over the past month. He's had a girlfriend move in with him and her daughter has caused unimaginable amount of stress on him. This is on top of the stress ER use been experiencing at work. He believes that Prozac is helping somewhat but there are situations where the daughter stresses him out to the point where he feels like he can't function. He was given Ativan by our urgent care office recently and he tells me it helped but put him asleep within a few minutes. He wants another something less potent that he can take  Complains of generalized toe pain and wants nothing of her referral to a podiatrist.  Complains of difficulty both initiating and maintaining an erection. He tried Cialis in the past but it was incredibly expensive and he can remember for work. He denies any other genitourinary complaints symptoms are moderate in severity and occurred during every sexual encounter. He denies any penile discharge or dysuria.   Review Of Systems Outlined In HPI  Past Medical History  Diagnosis Date  . Diabetes mellitus without complication   . High triglycerides   . Hypertension     Past Surgical History  Procedure Laterality Date  . Tonsillectomy     Family History  Problem Relation Age of Onset  . Heart failure Mother     History   Social History  . Marital Status: Single    Spouse Name: N/A  . Number of Children: N/A  . Years of Education: N/A   Occupational History  . Not on file.   Social History Main Topics  . Smoking status: Never Smoker   . Smokeless tobacco: Not on file  . Alcohol Use: Yes  . Drug Use: No  . Sexual Activity: Not on file   Other Topics Concern  . Not on file   Social History Narrative     Objective: BP 175/98 mmHg  Pulse 89  Wt 189 lb (85.73 kg)  Vital signs  reviewed. General: Alert and Oriented, No Acute Distress HEENT: Pupils equal, round, reactive to light. Conjunctivae clear.  External ears unremarkable.  Moist mucous membranes. Lungs: Clear and comfortable work of breathing, speaking in full sentences without accessory muscle use. Cardiac: Regular rate and rhythm.  Neuro: CN II-XII grossly intact, gait normal. Extremities: No peripheral edema.  Strong peripheral pulses.  Mental Status: No depression,nor agitation. Mild anxiety. Logical though process. Skin: Warm and dry.  Assessment & Plan: Seth Weeks was seen today for anxiety.  Diagnoses and all orders for this visit:  Pain of toe, unspecified laterality Orders: -     Ambulatory referral to Podiatry  Anxiety state Orders: -     clonazePAM (KLONOPIN) 0.5 MG tablet; Take 0.5-1 tablets (0.25-0.5 mg total) by mouth 2 (two) times daily as needed for anxiety.  Erectile dysfunction, unspecified erectile dysfunction type  Other orders -     Discontinue: sildenafil (VIAGRA) 100 MG tablet; Take 0.5-1 tablets (50-100 mg total) by mouth daily as needed for erectile dysfunction. -     sildenafil (VIAGRA) 100 MG tablet; Take 0.5-1 tablets (50-100 mg total) by mouth daily as needed for erectile dysfunction.  Erectile dysfunction: Begin Viagra, savings voucher provided. Anxiety: Uncontrolled chronic condition continue daily Prozac adding low dose of as needed clonazepam.  I've asked him to follow-up with me in the  near future for management of chronic conditions.  Return in about 3 months (around 04/16/2015).

## 2015-01-20 ENCOUNTER — Encounter: Payer: Self-pay | Admitting: Family Medicine

## 2015-01-20 ENCOUNTER — Ambulatory Visit (INDEPENDENT_AMBULATORY_CARE_PROVIDER_SITE_OTHER): Payer: BLUE CROSS/BLUE SHIELD | Admitting: Family Medicine

## 2015-01-20 VITALS — BP 149/90 | HR 101 | Ht 68.5 in | Wt 190.0 lb

## 2015-01-20 DIAGNOSIS — N529 Male erectile dysfunction, unspecified: Secondary | ICD-10-CM

## 2015-01-20 DIAGNOSIS — S61412A Laceration without foreign body of left hand, initial encounter: Secondary | ICD-10-CM | POA: Diagnosis not present

## 2015-01-20 MED ORDER — SILDENAFIL CITRATE 20 MG PO TABS: ORAL_TABLET | ORAL | Status: DC

## 2015-01-20 NOTE — Progress Notes (Signed)
CC: Seth Weeks is a 54 y.o. male is here for Laceration   Subjective: HPI:  Follow-up erectile dysfunction: Unable to afford brand name Viagra. He wants know if there is an alternative.  On Saturday night he accidentally cut his left hand at the base of the thumb with a utility knife. He went immediately to a local emergency room where he received stitches. He tells me the pain was interfering with his quality of life the first day however as of Sunday he has been pain-free and did not pick up any of his pain medication.  He is currently keeping the wound dry and clean. He does not have any difficulty doing this. He was also given a splint to immobilize his thumb. He denies any drainage or bleeding from the site over the past 48 hours. 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 Denies fevers, chills, joint pain, nor skin changes other than that described above his last tetanus was within the last 2 or 3 years.   Review Of Systems Outlined In HPI  Past Medical History  Diagnosis Date  . Diabetes mellitus without complication   . High triglycerides   . Hypertension     Past Surgical History  Procedure Laterality Date  . Tonsillectomy     Family History  Problem Relation Age of Onset  . Heart failure Mother     History   Social History  . Marital Status: Single    Spouse Name: N/A  . Number of Children: N/A  . Years of Education: N/A   Occupational History  . Not on file.   Social History Main Topics  . Smoking status: Never Smoker   . Smokeless tobacco: Not on file  . Alcohol Use: Yes  . Drug Use: No  . Sexual Activity: Not on file   Other Topics Concern  . Not on file   Social History Narrative     Objective: BP 149/90 mmHg  Pulse 101  Ht 5' 8.5" (1.74 m)  Wt 190 lb (86.183 kg)  BMI 28.47 kg/m2  Vital signs reviewed. General: Alert and Oriented, No Acute Distress HEENT: Pupils equal, round, reactive to light. Conjunctivae clear.  External ears  unremarkable.  Moist mucous membranes. Lungs: Clear and comfortable work of breathing, speaking in full sentences without accessory muscle use. Cardiac: Regular rate and rhythm.  Neuro: CN II-XII grossly intact, gait normal. Extremities: No peripheral edema.  Strong peripheral pulses.  Mental Status: No depression, anxiety, nor agitation. Logical though process. Skin: Warm and dry. On the thenar aspect of the left hand there is a clean well approximated 2 cm laceration not tender to the touch. There is no bleeding or discharge. He has full range of motion of his thumb, distal to the laceration he is neurovascularly intact. Assessment & Plan: Seth Weeks was seen today for laceration.  Diagnoses and all orders for this visit:  Hand laceration, left, initial encounter  Erectile dysfunction, unspecified erectile dysfunction type  Other orders -     sildenafil (REVATIO) 20 MG tablet; 1-4 tablets only as needed for sex.   Hand laceration: This appears to be healing appropriately, he'll need to have the stitches in for at least another 6 days. I've advised against any heavy lifting or gripping with the left hand. I've advised him to be out of work until he can have the stitches removed and we can confirm that the wound has fully healed. He tells me that he would  prefer to go back to work on Wednesday. I've asked him to call me if  he ends up changing his mind on give him a work note until the beginning of next week when he can return to have a recheck.  Return in about 1 week (around 01/27/2015) for suture removal.

## 2015-01-22 ENCOUNTER — Telehealth: Payer: Self-pay | Admitting: *Deleted

## 2015-01-22 ENCOUNTER — Encounter: Payer: Self-pay | Admitting: *Deleted

## 2015-01-22 NOTE — Telephone Encounter (Signed)
Seth Weeks I'm perfectly fine with this can you please print off a new letter for him? This was my original recommendation anyway

## 2015-01-22 NOTE — Telephone Encounter (Signed)
Pt notified of letter

## 2015-01-22 NOTE — Telephone Encounter (Signed)
Pt called today stating that his is still having a lot of pain in his hand, more than he'd anticipated.  He wanted to know if you'd be ok with going ahead and extending his work note through Fri.  Please advise.

## 2015-01-27 ENCOUNTER — Ambulatory Visit (INDEPENDENT_AMBULATORY_CARE_PROVIDER_SITE_OTHER): Payer: BLUE CROSS/BLUE SHIELD | Admitting: Family Medicine

## 2015-01-27 ENCOUNTER — Encounter: Payer: Self-pay | Admitting: Family Medicine

## 2015-01-27 VITALS — BP 178/103 | HR 94 | Wt 188.0 lb

## 2015-01-27 DIAGNOSIS — Z4802 Encounter for removal of sutures: Secondary | ICD-10-CM | POA: Diagnosis not present

## 2015-01-27 DIAGNOSIS — N529 Male erectile dysfunction, unspecified: Secondary | ICD-10-CM | POA: Diagnosis not present

## 2015-01-27 NOTE — Progress Notes (Signed)
CC: Seth Weeks is a 54 y.o. male is here for Suture / Staple Removal   Subjective: HPI:  Follow-up laceration: It's been a little bit more than a week since he had sutures placed in his left palm after cutting himself with a knife. He's been resting the hand keeping the wound clean and denies any discharge or drainage. Slightly tender to touch or when making a fist. He denies any redness around the wound or new pain in the appendage. He denies any motor or sensory disturbances.  Follow-up erectile dysfunction: He is getting a benefit from generic Viagra with no known side effects.  no exertional chest pain.   Review Of Systems Outlined In HPI  Past Medical History  Diagnosis Date  . Diabetes mellitus without complication   . High triglycerides   . Hypertension     Past Surgical History  Procedure Laterality Date  . Tonsillectomy     Family History  Problem Relation Age of Onset  . Heart failure Mother     History   Social History  . Marital Status: Single    Spouse Name: N/A  . Number of Children: N/A  . Years of Education: N/A   Occupational History  . Not on file.   Social History Main Topics  . Smoking status: Never Smoker   . Smokeless tobacco: Not on file  . Alcohol Use: Yes  . Drug Use: No  . Sexual Activity: Not on file   Other Topics Concern  . Not on file   Social History Narrative     Objective: BP 178/103 mmHg  Pulse 94  Wt 188 lb (85.276 kg)  Vital signs reviewed. General: Alert and Oriented, No Acute Distress HEENT: Pupils equal, round, reactive to light. Conjunctivae clear.  External ears unremarkable.  Moist mucous membranes. Lungs: Clear and comfortable work of breathing, speaking in full sentences without accessory muscle use. Cardiac: Regular rate and rhythm.  Neuro: CN II-XII grossly intact, gait normal. Extremities: No peripheral edema.  Strong peripheral pulses.  Mental Status: No depression, anxiety, nor agitation. Logical though  process. Skin: Warm and dry. Laceration site on the left palm is clean dry and intact without tenderness  Assessment & Plan: Seth Weeks was seen today for suture / staple removal.  Diagnoses and all orders for this visit:  Erectile dysfunction, unspecified erectile dysfunction type  Visit for suture removal   5 sutures were easily removed without complications. Steri-Strips were applied to his hand  along with some to take home to use on a daily basis for the next week.   ED: Stable and controlled on sildenafil.  Return if symptoms worsen or fail to improve.

## 2015-01-28 ENCOUNTER — Ambulatory Visit (INDEPENDENT_AMBULATORY_CARE_PROVIDER_SITE_OTHER): Payer: BLUE CROSS/BLUE SHIELD | Admitting: Sports Medicine

## 2015-01-28 ENCOUNTER — Encounter: Payer: Self-pay | Admitting: Sports Medicine

## 2015-01-28 VITALS — BP 146/84 | HR 88 | Wt 187.0 lb

## 2015-01-28 DIAGNOSIS — M216X9 Other acquired deformities of unspecified foot: Secondary | ICD-10-CM | POA: Diagnosis not present

## 2015-01-28 DIAGNOSIS — Q667 Congenital pes cavus, unspecified foot: Secondary | ICD-10-CM | POA: Insufficient documentation

## 2015-01-28 NOTE — Assessment & Plan Note (Signed)
With fifth MTP pain, x-rays were negative, uric acid level was normal. Custom orthotics as above. Return in one month, we can inject the joint if no better.

## 2015-01-28 NOTE — Progress Notes (Signed)

## 2015-02-13 ENCOUNTER — Other Ambulatory Visit: Payer: Self-pay | Admitting: *Deleted

## 2015-02-13 DIAGNOSIS — F329 Major depressive disorder, single episode, unspecified: Secondary | ICD-10-CM

## 2015-02-13 DIAGNOSIS — F32A Depression, unspecified: Secondary | ICD-10-CM

## 2015-02-13 MED ORDER — FLUOXETINE HCL 20 MG PO TABS: 40.0000 mg | ORAL_TABLET | Freq: Every day | ORAL | Status: DC

## 2015-03-03 ENCOUNTER — Other Ambulatory Visit: Payer: Self-pay | Admitting: Family Medicine

## 2015-04-09 ENCOUNTER — Other Ambulatory Visit: Payer: Self-pay | Admitting: Family Medicine

## 2015-04-10 ENCOUNTER — Other Ambulatory Visit: Payer: Self-pay | Admitting: Family Medicine

## 2015-05-08 ENCOUNTER — Ambulatory Visit (INDEPENDENT_AMBULATORY_CARE_PROVIDER_SITE_OTHER): Payer: BLUE CROSS/BLUE SHIELD | Admitting: Family Medicine

## 2015-05-08 ENCOUNTER — Encounter: Payer: Self-pay | Admitting: Family Medicine

## 2015-05-08 VITALS — BP 145/75 | HR 75 | Wt 194.0 lb

## 2015-05-08 DIAGNOSIS — R195 Other fecal abnormalities: Secondary | ICD-10-CM | POA: Diagnosis not present

## 2015-05-08 DIAGNOSIS — E119 Type 2 diabetes mellitus without complications: Secondary | ICD-10-CM | POA: Diagnosis not present

## 2015-05-08 LAB — POCT GLYCOSYLATED HEMOGLOBIN (HGB A1C): Hemoglobin A1C: 11.4

## 2015-05-08 MED ORDER — DIPHENOXYLATE-ATROPINE 2.5-0.025 MG PO TABS: 1.0000 | ORAL_TABLET | Freq: Four times a day (QID) | ORAL | Status: DC | PRN

## 2015-05-08 MED ORDER — LOPERAMIDE HCL 2 MG PO TABS: 2.0000 mg | ORAL_TABLET | Freq: Four times a day (QID) | ORAL | Status: DC | PRN

## 2015-05-08 MED ORDER — GLIPIZIDE-METFORMIN HCL 2.5-500 MG PO TABS: ORAL_TABLET | ORAL | Status: DC

## 2015-05-08 NOTE — Progress Notes (Signed)
CC: Seth Weeks is a 54 y.o. male is here for Medication Management and Diabetes   Subjective: HPI:  Follow-up type 2 diabetes: Reports somewhere between 50%-75% compliance with metformin use. He denies any known side effects. He has diarrhea most days of the week if not using the below medications but there is no correlation between when he takes metformin and when his diarrhea is present. Symptoms of polyuria polyphagia or polydipsia are absent. He denies vision loss. No outside blood sugars to report. Currently not following dietary guidelines.  Requesting refills on Lomotil. He tells me this works great to prevent loose stools however sometimes he'll get breakthrough loose stools which is then taking care of by using Imodium right ear. He denies any blood in stool, melanotic, abdominal pain or unintentional weight loss. Nothing seems to make symptoms better or worse other than Lomotil and Imodium.   Review Of Systems Outlined In HPI  Past Medical History  Diagnosis Date  . Diabetes mellitus without complication ([place])   . High triglycerides   . Hypertension     Past Surgical History  Procedure Laterality Date  . Tonsillectomy     Family History  Problem Relation Age of Onset  . Heart failure Mother     Social History   Social History  . Marital Status: Single    Spouse Name: N/A  . Number of Children: N/A  . Years of Education: N/A   Occupational History  . Not on file.   Social History Main Topics  . Smoking status: Never Smoker   . Smokeless tobacco: Not on file  . Alcohol Use: Yes  . Drug Use: No  . Sexual Activity: Not on file   Other Topics Concern  . Not on file   Social History Narrative     Objective: BP 145/75 mmHg  Pulse 75  Wt 194 lb (87.998 kg)  Vital signs reviewed. General: Alert and Oriented, No Acute Distress HEENT: Pupils equal, round, reactive to light. Conjunctivae clear.  External ears unremarkable.  Moist mucous membranes. Lungs:  Clear and comfortable work of breathing, speaking in full sentences without accessory muscle use. Cardiac: Regular rate and rhythm.  Neuro: CN II-XII grossly intact, gait normal. Extremities: No peripheral edema.  Strong peripheral pulses.  Mental Status: No depression, anxiety, nor agitation. Logical though process. Skin: Warm and dry.  Assessment & Plan: [place] was seen today for medication management and diabetes.  Diagnoses and all orders for this visit:  Type 2 diabetes mellitus without complication, without long-term current use of insulin (HCC) -     POCT HgB A1C -     glipiZIDE-metformin (METAGLIP) 2.5-500 MG tablet; Two by mouth twice a day.  Loose stools -     diphenoxylate-atropine (LOMOTIL) 2.5-0.025 MG tablet; Take 1 tablet by mouth 4 (four) times daily as needed for diarrhea or loose stools. -     loperamide (IMODIUM A-D) 2 MG tablet; Take 1 tablet (2 mg total) by mouth 4 (four) times daily as needed for diarrhea or loose stools.   Type 2 diabetes: A1c at 11, uncontrolled, switching from metformin to glipizide-metformin , combination. Discussed reducing carbohydrates in trying to increase physical activity. Focus on avoiding simple sugars. Loose stools: Controlled with Lomotil and Imodium, is not interested in further workup of the source of his loose stools given that they've been present for matter of years and not interfere with quality of life.   Return in about 3 months (around 08/08/2015).

## 2015-05-12 ENCOUNTER — Ambulatory Visit: Payer: Self-pay | Admitting: Family Medicine

## 2015-07-12 ENCOUNTER — Encounter: Payer: Self-pay | Admitting: Emergency Medicine

## 2015-07-12 ENCOUNTER — Emergency Department (INDEPENDENT_AMBULATORY_CARE_PROVIDER_SITE_OTHER)
Admission: EM | Admit: 2015-07-12 | Discharge: 2015-07-12 | Disposition: A | Discharge status: Home / Self Care | Payer: BLUE CROSS/BLUE SHIELD | Source: Home / Self Care | Attending: Family Medicine | Admitting: Family Medicine

## 2015-07-12 DIAGNOSIS — B9789 Other viral agents as the cause of diseases classified elsewhere: Principal | ICD-10-CM

## 2015-07-12 DIAGNOSIS — J069 Acute upper respiratory infection, unspecified: Secondary | ICD-10-CM | POA: Diagnosis not present

## 2015-07-12 DIAGNOSIS — H65192 Other acute nonsuppurative otitis media, left ear: Secondary | ICD-10-CM | POA: Diagnosis not present

## 2015-07-12 MED ORDER — AZITHROMYCIN 250 MG PO TABS: ORAL_TABLET | ORAL | Status: DC

## 2015-07-12 MED ORDER — BENZONATATE 200 MG PO CAPS: 200.0000 mg | ORAL_CAPSULE | Freq: Every day | ORAL | Status: DC

## 2015-07-12 MED ORDER — PREDNISONE 50 MG PO TABS: ORAL_TABLET | ORAL | Status: DC

## 2015-07-12 NOTE — Discharge Instructions (Signed)
Take plain guaifenesin (1200mg  extended release tabs such as Mucinex) twice daily, with plenty of water, for cough and congestion.  May add Pseudoephedrine (30mg , one or two every 4 to 6 hours) for sinus congestion.  Get adequate rest.   May use Afrin nasal spray (or generic oxymetazoline) twice daily for about 5 days and then discontinue.  Also recommend using saline nasal spray several times daily and saline nasal irrigation (AYR is a common brand).   Try warm salt water gargles for sore throat.  Stop all antihistamines for now, and other non-prescription cough/cold preparations. Follow-up with family doctor if not improving about 7 to10 days.

## 2015-07-12 NOTE — ED Notes (Signed)
Pt c/o cough, chest congestion and some wheezing

## 2015-07-12 NOTE — ED Provider Notes (Signed)
CSN: [id number]     Arrival date & time 07/12/15  1556 History   First MD Initiated Contact with Patient 07/12/15 1608     Chief Complaint  Patient presents with  . Cough      HPI Comments: Patient complains of two day history of typical cold-like symptoms including mild sore throat, sinus congestion, fatigue, and cough.   The history is provided by the patient.    Past Medical History  Diagnosis Date  . Diabetes mellitus without complication ([place])   . High triglycerides   . Hypertension    Past Surgical History  Procedure Laterality Date  . Tonsillectomy     Family History  Problem Relation Age of Onset  . Heart failure Mother    Social History  Substance Use Topics  . Smoking status: Never Smoker   . Smokeless tobacco: None  . Alcohol Use: Yes    Review of Systems + sore throat + cough No pleuritic pain + wheezing + nasal congestion + post-nasal drainage No sinus pain/pressure No itchy/red eyes ? earache No hemoptysis No SOB No fever/ + chills/sweats No nausea No vomiting No abdominal pain No diarrhea No urinary symptoms No skin rash + fatigue No myalgias No headache Used OTC meds without relief   Allergies  Penicillins and Terramycin  Home Medications   Prior to Admission medications   Medication Sig Start Date End Date Taking? Authorizing Provider  azithromycin (ZITHROMAX Z-PAK) 250 MG tablet Take 2 tabs today; then begin one tab once daily for 4 more days. 07/12/15   Kandra Nicolas, MD  benzonatate (TESSALON) 200 MG capsule Take 1 capsule (200 mg total) by mouth at bedtime. Take as needed for cough 07/12/15   Kandra Nicolas, MD  clonazePAM (KLONOPIN) 0.5 MG tablet Take 0.5-1 tablets (0.25-0.5 mg total) by mouth 2 (two) times daily as needed for anxiety. 01/14/15   Marcial Pacas, DO  Colchicine (MITIGARE) 0.6 MG CAPS Take 1 capsule by mouth daily. 01/04/15   Silverio Decamp, MD  diphenoxylate-atropine (LOMOTIL) 2.5-0.025 MG tablet Take 1  tablet by mouth 4 (four) times daily as needed for diarrhea or loose stools. 05/08/15   Sean Hommel, DO  FLUoxetine (PROZAC) 20 MG tablet Take 2 tablets (40 mg total) by mouth daily. 02/13/15   Sean Hommel, DO  glipiZIDE-metformin (METAGLIP) 2.5-500 MG tablet Two by mouth twice a day. 05/08/15   Sean Hommel, DO  LACTAID 3000 UNITS tablet TAKE ONE TABLET BY MOUTH THREE TIMES DAILY AS NEEDED 12/27/14   Marcial Pacas, DO  loperamide (IMODIUM A-D) 2 MG tablet Take 1 tablet (2 mg total) by mouth 4 (four) times daily as needed for diarrhea or loose stools. 05/08/15   Marcial Pacas, DO  predniSONE (DELTASONE) 50 MG tablet Take one tab by mouth with food once daily for five days 07/12/15   Kandra Nicolas, MD  sildenafil (REVATIO) 20 MG tablet 1-4 tablets only as needed for sex. 01/20/15   Marcial Pacas, DO   Meds Ordered and Administered this Visit  Medications - No data to display  BP 129/79 mmHg  Pulse 91  Temp(Src) 98.6 F (37 C) (Oral)  Ht 5\' 8"  (1.727 m)  Wt 189 lb 8 oz (85.957 kg)  BMI 28.82 kg/m2  SpO2 99% No data found.   Physical Exam Nursing notes and Vital Signs reviewed. Appearance:  Patient appears stated age, and in no acute distress Eyes:  Pupils are equal, round, and reactive to light and accomodation.  Extraocular  movement is intact.  Conjunctivae are not inflamed  Ears:  Canals normal.  Right tympanic membrane normal.  Left tympanic membrane erythematous with poor landmarks Nose:   Congested turbinates.  No sinus tenderness.   Pharynx:  Normal Neck:  Supple.  Tender enlarged posterior nodes are palpated bilaterally  Lungs:  Clear to auscultation.  Breath sounds are equal.  Moving air well. Heart:  Regular rate and rhythm without murmurs, rubs, or gallops.  Abdomen:  Nontender without masses or hepatosplenomegaly.  Bowel sounds are present.  No CVA or flank tenderness.  Extremities:  No edema.   Skin:  No rash present.   ED Course  Procedures  None    MDM   1. Viral URI with  cough   2. Acute nonsuppurative otitis media of left ear    Begin Z-pak, and prednisone burst.  Prescription written for Benzonatate (Tessalon) to take at bedtime for night-time cough.   Take plain guaifenesin (1200mg  extended release tabs such as Mucinex) twice daily, with plenty of water, for cough and congestion.  May add Pseudoephedrine (30mg , one or two every 4 to 6 hours) for sinus congestion.  Get adequate rest.   May use Afrin nasal spray (or generic oxymetazoline) twice daily for about 5 days and then discontinue.  Also recommend using saline nasal spray several times daily and saline nasal irrigation (AYR is a common brand).   Try warm salt water gargles for sore throat.  Stop all antihistamines for now, and other non-prescription cough/cold preparations. Follow-up with family doctor if not improving about 7 to10 days.     Kandra Nicolas, MD 07/18/15 [phone]

## 2015-07-14 ENCOUNTER — Telehealth: Payer: Self-pay | Admitting: Emergency Medicine

## 2015-07-17 ENCOUNTER — Ambulatory Visit (INDEPENDENT_AMBULATORY_CARE_PROVIDER_SITE_OTHER): Payer: BLUE CROSS/BLUE SHIELD | Admitting: Family Medicine

## 2015-07-17 ENCOUNTER — Telehealth: Payer: Self-pay | Admitting: *Deleted

## 2015-07-17 ENCOUNTER — Encounter: Payer: Self-pay | Admitting: Family Medicine

## 2015-07-17 VITALS — BP 141/81 | HR 99 | Wt 187.0 lb

## 2015-07-17 DIAGNOSIS — F418 Other specified anxiety disorders: Secondary | ICD-10-CM | POA: Diagnosis not present

## 2015-07-17 DIAGNOSIS — F32A Depression, unspecified: Secondary | ICD-10-CM

## 2015-07-17 DIAGNOSIS — F329 Major depressive disorder, single episode, unspecified: Secondary | ICD-10-CM

## 2015-07-17 DIAGNOSIS — F411 Generalized anxiety disorder: Secondary | ICD-10-CM

## 2015-07-17 DIAGNOSIS — R05 Cough: Secondary | ICD-10-CM | POA: Diagnosis not present

## 2015-07-17 DIAGNOSIS — F419 Anxiety disorder, unspecified: Principal | ICD-10-CM

## 2015-07-17 DIAGNOSIS — R059 Cough, unspecified: Secondary | ICD-10-CM

## 2015-07-17 HISTORY — DX: Generalized anxiety disorder: F41.1

## 2015-07-17 MED ORDER — HYDROCODONE-HOMATROPINE 5-1.5 MG/5ML PO SYRP: 5.0000 mL | ORAL_SOLUTION | Freq: Four times a day (QID) | ORAL | Status: DC | PRN

## 2015-07-17 MED ORDER — CLONAZEPAM 0.5 MG PO TABS: 0.2500 mg | ORAL_TABLET | Freq: Two times a day (BID) | ORAL | Status: DC | PRN

## 2015-07-17 NOTE — Telephone Encounter (Signed)
Pt called saying he is still coughing bad.  He did get the hydrocodone syrup filled this morning after his visit.  Said that Dr. Ileene Rubens mentioned he could try something else.   What else would you like to prescribe?

## 2015-07-17 NOTE — Progress Notes (Signed)
CC: Seth Weeks is a 54 y.o. male is here for Cough and Medication Refill   Subjective: HPI:  Cough: Present for the last two weeks, started to get better with prednisone, Zpack, and tessalon pearles started on the weekend.  Seems to have plateaued as of Tuesday. Symptoms are present all hours of the day and interfering with sleep. Nothing seems to make symptoms better or worse. They're moderate in severity. He was having chills but this is resolved as of Sunday. He denies chest pain shortness of breath wheezing or productive cough.  He's requesting a refill on clonazepam. He takes this only if his work causes her to have irritability and anxiety to the point where he has physical symptoms of restlessness or tremor. Symptoms are greatly improved with clonazepam. He denies any sedation or side effects.   Review Of Systems Outlined In HPI  Past Medical History  Diagnosis Date  . Diabetes mellitus without complication ([place])   . High triglycerides   . Hypertension     Past Surgical History  Procedure Laterality Date  . Tonsillectomy     Family History  Problem Relation Age of Onset  . Heart failure Mother     Social History   Social History  . Marital Status: Single    Spouse Name: N/A  . Number of Children: N/A  . Years of Education: N/A   Occupational History  . Not on file.   Social History Main Topics  . Smoking status: Never Smoker   . Smokeless tobacco: Not on file  . Alcohol Use: Yes  . Drug Use: No  . Sexual Activity: Not on file   Other Topics Concern  . Not on file   Social History Narrative     Objective: BP 141/81 mmHg  Pulse 99  Wt 187 lb (84.823 kg)  General: Alert and Oriented, No Acute Distress HEENT: Pupils equal, round, reactive to light. Conjunctivae clear.  External ears unremarkable, canals clear with intact TMs with appropriate landmarks.  Middle ear appears open without effusion. Pink inferior turbinates.  Moist mucous membranes, pharynx  without inflammation nor lesions.  Neck supple without palpable lymphadenopathy nor abnormal masses. Lungs: Clear to auscultation bilaterally, no wheezing/ronchi/rales.  Comfortable work of breathing. Good air movement. Frequent coughing Extremities: No peripheral edema.  Strong peripheral pulses.  Mental Status: No depression, anxiety, nor agitation. Skin: Warm and dry.  Assessment & Plan: Seth Weeks was seen today for cough and medication refill.  Diagnoses and all orders for this visit:  Anxiety and depression  Anxiety state -     clonazePAM (KLONOPIN) 0.5 MG tablet; Take 0.5-1 tablets (0.25-0.5 mg total) by mouth 2 (two) times daily as needed for anxiety.  Cough  Other orders -     HYDROcodone-homatropine (HYCODAN) 5-1.5 MG/5ML syrup; Take 5 mLs by mouth every 6 (six) hours as needed for cough.   Anxiety: Controlled with daily Prozac and as needed use of clonazepam. Cough: Overall improving with prednisone and azithromycin, adding Hycodan since symptoms are still interfering with quality of life. Call on Friday if no response.  25 minutes spent face-to-face during visit today of which at least 50% was counseling or coordinating care regarding: 1. Anxiety and depression   2. Anxiety state   3. Cough      Return if symptoms worsen or fail to improve.

## 2015-07-18 NOTE — Telephone Encounter (Signed)
Actually I'm not sure of anything else to help reduce the cough since he's already tried all medications that I  Know of to suppress a cough, I'm afraid he'll have to wait it out.

## 2015-07-18 NOTE — Telephone Encounter (Signed)
Pt.notified

## 2015-10-11 ENCOUNTER — Other Ambulatory Visit: Payer: Self-pay | Admitting: Family Medicine

## 2015-10-27 ENCOUNTER — Encounter: Payer: Self-pay | Admitting: Family Medicine

## 2015-10-27 ENCOUNTER — Ambulatory Visit (INDEPENDENT_AMBULATORY_CARE_PROVIDER_SITE_OTHER): Payer: BLUE CROSS/BLUE SHIELD | Admitting: Family Medicine

## 2015-10-27 VITALS — BP 152/89 | HR 88 | Wt 194.0 lb

## 2015-10-27 DIAGNOSIS — F418 Other specified anxiety disorders: Secondary | ICD-10-CM | POA: Diagnosis not present

## 2015-10-27 DIAGNOSIS — E119 Type 2 diabetes mellitus without complications: Secondary | ICD-10-CM

## 2015-10-27 DIAGNOSIS — F419 Anxiety disorder, unspecified: Principal | ICD-10-CM

## 2015-10-27 DIAGNOSIS — F32A Depression, unspecified: Secondary | ICD-10-CM

## 2015-10-27 DIAGNOSIS — I10 Essential (primary) hypertension: Secondary | ICD-10-CM

## 2015-10-27 DIAGNOSIS — F329 Major depressive disorder, single episode, unspecified: Secondary | ICD-10-CM

## 2015-10-27 MED ORDER — CLONAZEPAM 1 MG PO TABS: 1.0000 mg | ORAL_TABLET | Freq: Two times a day (BID) | ORAL | Status: DC | PRN

## 2015-10-27 MED ORDER — AMBULATORY NON FORMULARY MEDICATION: Status: DC

## 2015-10-27 MED ORDER — FLUOXETINE HCL 20 MG PO TABS: 40.0000 mg | ORAL_TABLET | Freq: Every day | ORAL | Status: DC

## 2015-10-27 NOTE — Progress Notes (Signed)
CC: Seth Weeks is a 55 y.o. male is here for Medication Management   Subjective: HPI:  Follow-up anxiety: He has noticed that clonazepam is not nearly as effective with helping him deal with stress at work compared to how it used to work in the past. It still provide some benefit but only a mild degree. He also believes that stress level at work has increased significantly over the past month. Symptoms are worse with having to deal with his supervisors and being overloaded with job responsibilities. He denies any sedation or coordination difficulties while taking clonazepam. On the days that he is not working he feels relaxed and does not worry about anything other than having to go back to work. Denies any depression.  He would like to look into nonpharmaceutical ways to help control his diabetes and hypertension. Currently no exercise regimen. No outside blood pressures her blood sugars to report. No chest pain shortness of breath orthopnea nor peripheral edema   Review Of Systems Outlined In HPI  Past Medical History  Diagnosis Date  . Diabetes mellitus without complication ([place])   . High triglycerides   . Hypertension     Past Surgical History  Procedure Laterality Date  . Tonsillectomy     Family History  Problem Relation Age of Onset  . Heart failure Mother     Social History   Social History  . Marital Status: Single    Spouse Name: N/A  . Number of Children: N/A  . Years of Education: N/A   Occupational History  . Not on file.   Social History Main Topics  . Smoking status: Never Smoker   . Smokeless tobacco: Not on file  . Alcohol Use: Yes  . Drug Use: No  . Sexual Activity: Not on file   Other Topics Concern  . Not on file   Social History Narrative     Objective: BP 152/89 mmHg  Pulse 88  Wt 194 lb (87.998 kg)  Vital signs reviewed. General: Alert and Oriented, No Acute Distress HEENT: Pupils equal, round, reactive to light. Conjunctivae clear.   External ears unremarkable.  Moist mucous membranes. Lungs: Clear and comfortable work of breathing, speaking in full sentences without accessory muscle use. Cardiac: Regular rate and rhythm.  Neuro: CN II-XII grossly intact, gait normal. Extremities: No peripheral edema.  Strong peripheral pulses.  Mental Status: No depression, anxiety, nor agitation. Logical though process. Skin: Warm and dry.  Assessment & Plan: Thermon was seen today for medication management.  Diagnoses and all orders for this visit:  Anxiety and depression  Essential hypertension, benign  Type 2 diabetes mellitus without complication, without long-term current use of insulin (HCC)  Depression -     FLUoxetine (PROZAC) 20 MG tablet; Take 2 tablets (40 mg total) by mouth daily.  Other orders -     AMBULATORY NON FORMULARY MEDICATION; Gym membership: use to exercise most days out of the week for treatment of hypertension and hyperglycemia. -     clonazePAM (KLONOPIN) 1 MG tablet; Take 1-1.5 tablets (1-1.5 mg total) by mouth 2 (two) times daily as needed for anxiety.   Anxiety: Uncontrolled at work therefore increasing clonazepam provided it does not cause sedation. Continue current dose of Prozac and if needed we can increase this after one week of clonazepam is still not helpful Essential hypertension: Uncontrolled chronic condition, he is not interested in pharmaceutical interventions but is open to the idea of getting a gym membership, I've written him a prescription  for this so that he can use his HSA account towards the membership.  No Follow-up on file.

## 2015-10-30 ENCOUNTER — Telehealth: Payer: Self-pay

## 2015-10-30 NOTE — Telephone Encounter (Signed)
Pt would like to know can he get an Rx for Cialis. Please advise.

## 2015-10-31 MED ORDER — TADALAFIL 20 MG PO TABS: 10.0000 mg | ORAL_TABLET | ORAL | Status: DC | PRN

## 2015-10-31 NOTE — Telephone Encounter (Signed)
Rx sent to [facility] in [place]

## 2015-11-16 ENCOUNTER — Other Ambulatory Visit: Payer: Self-pay | Admitting: Family Medicine

## 2015-11-17 MED ORDER — FLUOXETINE HCL (PMDD) 20 MG PO CAPS: 40.0000 mg | ORAL_CAPSULE | Freq: Every day | ORAL | Status: DC

## 2015-11-17 NOTE — Telephone Encounter (Signed)
Is this refills appropriate?

## 2015-11-17 NOTE — Telephone Encounter (Signed)
[place], Rx for lomotil placed in in-box ready for pickup/faxing.Alos his pharmacy notified me that fluoxetine in the capsule formulation is more affordable than the tablet formulation so I've approved this switch.

## 2015-12-13 ENCOUNTER — Other Ambulatory Visit: Payer: Self-pay | Admitting: Family Medicine

## 2016-01-11 ENCOUNTER — Other Ambulatory Visit: Payer: Self-pay | Admitting: Family Medicine

## 2016-01-13 ENCOUNTER — Other Ambulatory Visit: Payer: Self-pay

## 2016-01-13 MED ORDER — GLIPIZIDE-METFORMIN HCL 2.5-500 MG PO TABS: 2.0000 | ORAL_TABLET | Freq: Two times a day (BID) | ORAL | Status: DC

## 2016-02-17 ENCOUNTER — Other Ambulatory Visit: Payer: Self-pay | Admitting: Family Medicine

## 2016-03-01 ENCOUNTER — Ambulatory Visit: Payer: BLUE CROSS/BLUE SHIELD | Admitting: Family Medicine

## 2016-03-03 ENCOUNTER — Encounter: Payer: Self-pay | Admitting: Family Medicine

## 2016-03-03 ENCOUNTER — Ambulatory Visit (INDEPENDENT_AMBULATORY_CARE_PROVIDER_SITE_OTHER): Payer: BLUE CROSS/BLUE SHIELD | Admitting: Family Medicine

## 2016-03-03 VITALS — BP 147/90 | HR 89 | Wt 190.0 lb

## 2016-03-03 DIAGNOSIS — F32A Depression, unspecified: Secondary | ICD-10-CM

## 2016-03-03 DIAGNOSIS — F419 Anxiety disorder, unspecified: Principal | ICD-10-CM

## 2016-03-03 DIAGNOSIS — L989 Disorder of the skin and subcutaneous tissue, unspecified: Secondary | ICD-10-CM | POA: Diagnosis not present

## 2016-03-03 DIAGNOSIS — F418 Other specified anxiety disorders: Secondary | ICD-10-CM

## 2016-03-03 DIAGNOSIS — F329 Major depressive disorder, single episode, unspecified: Secondary | ICD-10-CM

## 2016-03-03 MED ORDER — ALPRAZOLAM 1 MG PO TABS: 1.0000 mg | ORAL_TABLET | Freq: Two times a day (BID) | ORAL | 1 refills | Status: DC | PRN

## 2016-03-03 MED ORDER — FLUOXETINE HCL (PMDD) 20 MG PO CAPS: ORAL_CAPSULE | ORAL | 2 refills | Status: DC

## 2016-03-03 NOTE — Progress Notes (Signed)
CC: Seth Weeks is a 55 y.o. male is here for Panic Attack; Toe Pain; and skin lesion   Subjective: HPI:  Over the past 2 weeks he's been expressing worsening anxiety at work. He tells me that his superiors or being more pessimistic with him. He had what he believes was a panic attack 1 week ago at work and has had another one at home while he was sleeping. He describes it as a sense of fear, that the world is ending, he hyperventilates and feels like he is losing control. Clonazepam helps somewhat but it takes up to half an hour for it to take effect.[name] He denies any chest pain, or any other motor or sensory symptoms other than anxiety and irritability. No new drug or alcohol use.  He also has a spot on his left forearm that's been present for a few months now. It is not painful or itchy. He wants to know what's called. It does not seem to be getting bigger or smaller since it came on he denies any recent trauma to the area   Review Of Systems Outlined In HPI  Past Medical History:  Diagnosis Date  . Diabetes mellitus without complication ([place])   . High triglycerides   . Hypertension     Past Surgical History:  Procedure Laterality Date  . TONSILLECTOMY     Family History  Problem Relation Age of Onset  . Heart failure Mother     Social History   Social History  . Marital status: Single    Spouse name: N/A  . Number of children: N/A  . Years of education: N/A   Occupational History  . Not on file.   Social History Main Topics  . Smoking status: Never Smoker  . Smokeless tobacco: Not on file  . Alcohol use Yes  . Drug use: No  . Sexual activity: Not on file   Other Topics Concern  . Not on file   Social History Narrative  . No narrative on file     Objective: BP (!) 147/90   Pulse 89   Wt 190 lb (86.2 kg)   BMI 28.89 kg/m   Vital signs reviewed. General: Alert and Oriented, No Acute Distress HEENT: Pupils equal, round, reactive to light. Conjunctivae  clear.  External ears unremarkable.  Moist mucous membranes. Lungs: Clear and comfortable work of breathing, speaking in full sentences without accessory muscle use. Cardiac: Regular rate and rhythm.  Neuro: CN II-XII grossly intact, gait normal. Extremities: No peripheral edema.  Strong peripheral pulses.  Mental Status: No depression, anxiety, nor agitation. Logical though process. Skin: Warm and dry. Waxy flesh-colored lesion about 1 cm long and half centimeter in width on the left forearm  Assessment & Plan: Seth Weeks was seen today for panic attack, toe pain and skin lesion.  Diagnoses and all orders for this visit:  Anxiety and depression  Skin lesion  Other orders -     Fluoxetine HCl, PMDD, 20 MG CAPS; Two by mouth every morning and one every evening. -     ALPRAZolam (XANAX) 1 MG tablet; Take 1 tablet (1 mg total) by mouth 2 (two) times daily as needed for anxiety.   Depression controlled however anxiety is uncontrolled therefore increasing fluoxetine by adding the evening dose. Surgeon from clonazepam to Xanax which hopefully will terminate his panic attacks faster than clonazepam. Discussed benign nature of his seborrheic keratosis on the left forearm  Return in about 2 weeks (around 03/17/2016).

## 2016-03-15 ENCOUNTER — Ambulatory Visit (INDEPENDENT_AMBULATORY_CARE_PROVIDER_SITE_OTHER): Payer: BLUE CROSS/BLUE SHIELD | Admitting: Family Medicine

## 2016-03-15 ENCOUNTER — Encounter: Payer: Self-pay | Admitting: Family Medicine

## 2016-03-15 VITALS — BP 165/98 | HR 87 | Wt 195.0 lb

## 2016-03-15 DIAGNOSIS — F418 Other specified anxiety disorders: Secondary | ICD-10-CM | POA: Diagnosis not present

## 2016-03-15 DIAGNOSIS — F32A Depression, unspecified: Secondary | ICD-10-CM

## 2016-03-15 DIAGNOSIS — F329 Major depressive disorder, single episode, unspecified: Secondary | ICD-10-CM

## 2016-03-15 DIAGNOSIS — F419 Anxiety disorder, unspecified: Principal | ICD-10-CM

## 2016-03-15 MED ORDER — FLUOXETINE HCL (PMDD) 20 MG PO CAPS: ORAL_CAPSULE | ORAL | 2 refills | Status: DC

## 2016-03-15 MED ORDER — ALPRAZOLAM 2 MG PO TABS: 2.0000 mg | ORAL_TABLET | Freq: Two times a day (BID) | ORAL | 1 refills | Status: DC | PRN

## 2016-03-15 NOTE — Progress Notes (Signed)
CC: Seth Weeks is a 55 y.o. male is here for Anxiety   Subjective: HPI:  Follow-up anxiety and depression: He denies any subjective depression since I saw him last. He continues to have a panic attack almost every other day, it's more likely to happen at work and rarely happens on the weekends when he is not at work. He's noticed that Xanax has helped to a moderate degree however he Still feels partially on edge despite taking this medication. It seems to minimize the chance of a panic attack becoming "full-blown".  He doesn't really see much benefit with respect to frequency or severity of his panic attacks since an evening dose of fluoxetine was added last visit.     Review Of Systems Outlined In HPI  Past Medical History:  Diagnosis Date  . Diabetes mellitus without complication ([place])   . High triglycerides   . Hypertension     Past Surgical History:  Procedure Laterality Date  . TONSILLECTOMY     Family History  Problem Relation Age of Onset  . Heart failure Mother     Social History   Social History  . Marital status: Single    Spouse name: N/A  . Number of children: N/A  . Years of education: N/A   Occupational History  . Not on file.   Social History Main Topics  . Smoking status: Never Smoker  . Smokeless tobacco: Not on file  . Alcohol use Yes  . Drug use: No  . Sexual activity: Not on file   Other Topics Concern  . Not on file   Social History Narrative  . No narrative on file     Objective: BP (!) 165/98   Pulse 87   Wt 195 lb (88.5 kg)   BMI 29.65 kg/m   General: Alert and Oriented, No Acute Distress HEENT: Pupils equal, round, reactive to light. Conjunctivae clear.  Moist mucous membranes Lungs: Clear to auscultation bilaterally, no wheezing/ronchi/rales.  Comfortable work of breathing. Good air movement. Cardiac: Regular rate and rhythm. Normal S1/S2.  No murmurs, rubs, nor gallops.   Extremities: No peripheral edema.  Strong peripheral  pulses.  Mental Status: No depression, anxiety, nor agitation. Skin: Warm and dry.  Assessment & Plan: Seth Weeks was seen today for anxiety.  Diagnoses and all orders for this visit:  Anxiety and depression  Other orders -     ALPRAZolam (XANAX) 2 MG tablet; Take 1 tablet (2 mg total) by mouth 2 (two) times daily as needed for anxiety. -     Fluoxetine HCl, PMDD, 20 MG CAPS; Two by mouth every morning and two every evening.   Depression is currently controlled however anxiety is still impaired quality of life, increasing Xanax to the 2 mg formulation and increasing fluoxetine in hopes of minimizing the chance of having panic attacks.  Discussed with this patient that I will be resigning from my position here with [facility] in September in order to stay with my family who will be moving to [facility]. I let him know about the providers that are still accepting patients and I feel that this individual will be under great care if he/she stays here with [facility].  He does not want a male to be his primary care physician.  25 minutes spent face-to-face during visit today of which at least 50% was counseling or coordinating care regarding: 1. Anxiety and depression      Return in about 2 months (around 05/15/2016) for mood.

## 2016-03-24 ENCOUNTER — Other Ambulatory Visit: Payer: Self-pay | Admitting: Family Medicine

## 2016-04-02 ENCOUNTER — Encounter: Payer: Self-pay | Admitting: Family Medicine

## 2016-04-02 ENCOUNTER — Ambulatory Visit (INDEPENDENT_AMBULATORY_CARE_PROVIDER_SITE_OTHER): Payer: BLUE CROSS/BLUE SHIELD | Admitting: Family Medicine

## 2016-04-02 VITALS — BP 134/82 | HR 80 | Wt 188.0 lb

## 2016-04-02 DIAGNOSIS — F419 Anxiety disorder, unspecified: Principal | ICD-10-CM

## 2016-04-02 DIAGNOSIS — F418 Other specified anxiety disorders: Secondary | ICD-10-CM

## 2016-04-02 DIAGNOSIS — M79676 Pain in unspecified toe(s): Secondary | ICD-10-CM | POA: Diagnosis not present

## 2016-04-02 DIAGNOSIS — F329 Major depressive disorder, single episode, unspecified: Secondary | ICD-10-CM

## 2016-04-02 DIAGNOSIS — F32A Depression, unspecified: Secondary | ICD-10-CM

## 2016-04-02 MED ORDER — ALPRAZOLAM 2 MG PO TABS: 2.0000 mg | ORAL_TABLET | Freq: Three times a day (TID) | ORAL | 1 refills | Status: DC | PRN

## 2016-04-02 NOTE — Progress Notes (Signed)
CC: Seth Weeks is a 55 y.o. male is here for Toe Pain and Medication Management   Subjective: HPI:  For matter months she's been experiencing left small toe pain. It's absent first thing in the morning. After he's been on his feet for more than 3 hours in steel toed boots it's described as sore to a mild degree. For matter of years he's noticed that the small toe has been slowly moving underneath the adjacent toe. Symptoms used to be managed with wearing a Band-Aid on the toe however it's now ineffective. Denies any overlying skin changes  Follow-up anxiety: He feels that Xanax is much more effective than Klonopin when it comes to dealing with work stress. Taking a dose before work and near the end of his workday helps with stress at work however he finds himself with excessive worrying about work once he gets home that interfere with him falling asleep.   Review Of Systems Outlined In HPI  Past Medical History:  Diagnosis Date  . Diabetes mellitus without complication ([place])   . High triglycerides   . Hypertension     Past Surgical History:  Procedure Laterality Date  . TONSILLECTOMY     Family History  Problem Relation Age of Onset  . Heart failure Mother     Social History   Social History  . Marital status: Single    Spouse name: N/A  . Number of children: N/A  . Years of education: N/A   Occupational History  . Not on file.   Social History Main Topics  . Smoking status: Never Smoker  . Smokeless tobacco: Not on file  . Alcohol use Yes  . Drug use: No  . Sexual activity: Not on file   Other Topics Concern  . Not on file   Social History Narrative  . No narrative on file     Objective: BP 134/82   Pulse 80   Wt 188 lb (85.3 kg)   BMI 28.59 kg/m   Vital signs reviewed. General: Alert and Oriented, No Acute Distress HEENT: Pupils equal, round, reactive to light. Conjunctivae clear.  External ears unremarkable.  Moist mucous membranes. Lungs: Clear and  comfortable work of breathing, speaking in full sentences without accessory muscle use. Cardiac: Regular rate and rhythm.  Neuro: CN II-XII grossly intact, gait normal. Extremities: No peripheral edema.  Strong peripheral pulses. Moderate medial deviation of the left small toe underneath the adjacent toe Mental Status: No depression, anxiety, nor agitation. Logical though process. Skin: Warm and dry.  Assessment & Plan: Seth Weeks was seen today for toe pain and medication management.  Diagnoses and all orders for this visit:  Anxiety and depression  Pain of toe, unspecified laterality  Other orders -     alprazolam (XANAX) 2 MG tablet; Take 1 tablet (2 mg total) by mouth 3 (three) times daily as needed for anxiety.   Anxiety and depression: Anxiety is uncontrolled, adding an additional dose of Xanax in the evening. Depression is controlled. Toe pain: I directed him to a local medical supply company for spacer that he can wear between these 2 toes and advised him that he may need surgery one day.  No Follow-up on file.

## 2016-04-24 ENCOUNTER — Other Ambulatory Visit: Payer: Self-pay | Admitting: Family Medicine

## 2016-05-03 ENCOUNTER — Ambulatory Visit (INDEPENDENT_AMBULATORY_CARE_PROVIDER_SITE_OTHER): Payer: BLUE CROSS/BLUE SHIELD | Admitting: Family Medicine

## 2016-05-03 ENCOUNTER — Encounter: Payer: Self-pay | Admitting: Family Medicine

## 2016-05-03 VITALS — BP 137/82 | HR 94 | Wt 192.0 lb

## 2016-05-03 DIAGNOSIS — F32A Depression, unspecified: Secondary | ICD-10-CM

## 2016-05-03 DIAGNOSIS — F419 Anxiety disorder, unspecified: Secondary | ICD-10-CM

## 2016-05-03 DIAGNOSIS — F418 Other specified anxiety disorders: Secondary | ICD-10-CM

## 2016-05-03 DIAGNOSIS — E1129 Type 2 diabetes mellitus with other diabetic kidney complication: Secondary | ICD-10-CM

## 2016-05-03 DIAGNOSIS — E119 Type 2 diabetes mellitus without complications: Secondary | ICD-10-CM

## 2016-05-03 DIAGNOSIS — R809 Proteinuria, unspecified: Secondary | ICD-10-CM

## 2016-05-03 DIAGNOSIS — I10 Essential (primary) hypertension: Secondary | ICD-10-CM

## 2016-05-03 DIAGNOSIS — Z1159 Encounter for screening for other viral diseases: Secondary | ICD-10-CM

## 2016-05-03 DIAGNOSIS — F329 Major depressive disorder, single episode, unspecified: Secondary | ICD-10-CM

## 2016-05-03 DIAGNOSIS — M25579 Pain in unspecified ankle and joints of unspecified foot: Secondary | ICD-10-CM | POA: Diagnosis not present

## 2016-05-03 LAB — POCT UA - MICROALBUMIN
CREATININE, POC: 50 mg/dL
MICROALBUMIN (UR) POC: 80 mg/L

## 2016-05-03 MED ORDER — ALPRAZOLAM 2 MG PO TABS: 2.0000 mg | ORAL_TABLET | Freq: Three times a day (TID) | ORAL | 1 refills | Status: DC | PRN

## 2016-05-03 MED ORDER — CANAGLIFLOZIN-METFORMIN HCL 150-1000 MG PO TABS: 1.0000 | ORAL_TABLET | Freq: Two times a day (BID) | ORAL | 1 refills | Status: DC

## 2016-05-03 MED ORDER — LOPERAMIDE HCL 2 MG PO CAPS: ORAL_CAPSULE | ORAL | 3 refills | Status: DC

## 2016-05-03 MED ORDER — FLUOXETINE HCL (PMDD) 20 MG PO CAPS: ORAL_CAPSULE | ORAL | 2 refills | Status: DC

## 2016-05-03 MED ORDER — LACTASE 3000 UNITS PO TABS: 1.0000 | ORAL_TABLET | Freq: Three times a day (TID) | ORAL | 3 refills | Status: DC | PRN

## 2016-05-03 MED ORDER — DIPHENOXYLATE-ATROPINE 2.5-0.025 MG PO TABS: 1.0000 | ORAL_TABLET | Freq: Four times a day (QID) | ORAL | 3 refills | Status: DC | PRN

## 2016-05-03 NOTE — Progress Notes (Signed)
Seth Weeks is a 55 y.o. male who presents to [facility] today for diabetes anxiety and depression.  Anxiety and depression: Patient notes worsening symptoms due to work. He notes a very stressful work environment. He takes fluoxetine approximately 60 mg total in one day as well as Xanax 2 mg 3 times a day. He feels as though he is nearly at his wit's end. He denies any active SI or or HI. He has done counseling in the past which he found to be mildly helpful.  Diabetes: Poorly controlled. Patient takes metformin and glipizide for diabetes twice daily. He notes polyuria and polydipsia. He denies any fevers or chills. He has not been seen for this issue in over a year with his last A1c greater than 11 about a year ago.  Foot pain: Patient is foot pain bilaterally. He notes his fourth and fifth toes bilaterally overlap. He has used Band-Aids to cushion the toes which help. He denies significant numbness or tingling to his feet bilaterally.   Past Medical History:  Diagnosis Date  . Diabetes mellitus without complication ([place])   . High triglycerides   . Hypertension    Past Surgical History:  Procedure Laterality Date  . TONSILLECTOMY     Social History  Substance Use Topics  . Smoking status: Never Smoker  . Smokeless tobacco: Not on file  . Alcohol use Yes   family history includes Heart failure in his mother.  ROS as above:  Medications: Current Outpatient Prescriptions  Medication Sig Dispense Refill  . alprazolam (XANAX) 2 MG tablet Take 1 tablet (2 mg total) by mouth 3 (three) times daily as needed for anxiety. 90 tablet 1  . AMBULATORY NON FORMULARY MEDICATION Gym membership: use to exercise most days out of the week for treatment of hypertension and hyperglycemia. 1 Units 0  . diphenoxylate-atropine (LOMOTIL) 2.5-0.025 MG tablet Take 1 tablet by mouth 4  (four) times daily as needed for diarrhea or loose stools. 120 tablet 3  . Fluoxetine HCl, PMDD, 20 MG CAPS Two by mouth every morning and two every evening. 120 each 2  . lactase (LACTAID) 3000 units tablet Take 1 tablet (3,000 Units total) by mouth 3 (three) times daily as needed. 120 tablet 3  . loperamide (IMODIUM) 2 MG capsule TAKE ONE CAPSULE BY MOUTH 4 TIMES DAILY AS NEEDED FOR DIARRHEA OR LOOSE STOOLS. 90 capsule 3  . tadalafil (CIALIS) 20 MG tablet Take 0.5-1 tablets (10-20 mg total) by mouth as needed for erectile dysfunction. 5 tablet 11  . Canagliflozin-Metformin HCl [phone] MG TABS Take 1 tablet by mouth 2 (two) times daily. 60 tablet 1   No current facility-administered medications for this visit.    Allergies  Allergen Reactions  . Bee Venom Anaphylaxis  . Penicillins   . Terramycin [Oxytetracycline]      Exam:  BP 137/82   Pulse 94   Wt 192 lb (87.1 kg)   BMI 29.19 kg/m  Gen: Well NAD HEENT: EOMI,  MMM Lungs: Normal work of breathing. CTABL Heart: RRR no MRG Abd: NABS, Soft. Nondistended, Nontender Exts: Brisk capillary refill, warm and well perfused.  Foot exam: Feet are dirty bilaterally with caked dirt between the toes. No ulcerations present. Toenails are long but not thickened. The fourth and fifth toes overlap but no skin ulceration or breakdown seen. Pulses are intact however sensation are decreased bilaterally to monofilament testing. Psych: Alert and oriented normal affect.  Speech is a bit pressured but thought process is mostly linear and goal-directed. No SI or HI expressed.  Depression screen PHQ 2/9 05/03/2016  Decreased Interest 2  Down, Depressed, Hopeless 3  PHQ - 2 Score 5  Altered sleeping 3  Tired, decreased energy 3  Change in appetite 2  Feeling bad or failure about yourself  3  Trouble concentrating 2  Moving slowly or fidgety/restless 2  Suicidal thoughts 2  PHQ-9 Score 22  Difficult doing work/chores Very difficult   GAD 7 :  Generalized Anxiety Score 05/03/2016  Nervous, Anxious, on Edge 3  Control/stop worrying 3  Worry too much - different things 3  Trouble relaxing 3  Restless 2  Easily annoyed or irritable 3  Afraid - awful might happen 3  Total GAD 7 Score 20  Anxiety Difficulty Very difficult      No results found for this or any previous visit (from the past 24 hour(s)). No results found.    Assessment and Plan: 55 y.o. male with  Anxiety and depression: Not well controlled despite pretty significant medication usage. At this time I think is reasonable to refer to psychiatry for further help.  Diabetes: Poorly controlled. Check A1c. Switch to Invokana metformin combination. Check labs in the near future..  Foot pain: Overlapping fourth and fifth toes with poor foot hygiene. Refer to podiatry for further help.  Recheck in one month.   Orders Placed This Encounter  Procedures  . CBC  . COMPLETE METABOLIC PANEL WITH GFR  . Hemoglobin A1c  . Lipid panel  . TSH  . VITAMIN D 25 Hydroxy (Vit-D Deficiency, Fractures)  . Hepatitis C antibody  . Ambulatory referral to Psychiatry    Referral Priority:   Routine    Referral Type:   Psychiatric    Referral Reason:   Specialty Services Required    Referred to Provider:   Merian Capron, MD    Requested Specialty:   Psychiatry    Number of Visits Requested:   1  . Ambulatory referral to Podiatry    Referral Priority:   Routine    Referral Type:   Consultation    Referral Reason:   Specialty Services Required    Requested Specialty:   Podiatry    Number of Visits Requested:   1    Discussed warning signs or symptoms. Please see discharge instructions. Patient expresses understanding.

## 2016-05-03 NOTE — Addendum Note (Signed)
Addended by: Darla Lesches T on: 05/03/2016 03:49 PM   Modules accepted: Orders

## 2016-05-03 NOTE — Patient Instructions (Signed)
Thank you for coming in today. You should hear from Podiatry and psychiatry soon.  Continue psych medicines.  STOP diabetes medicine.  START Invokamet.  Recheck in 1 months.   Canagliflozin; Metformin oral tablets What is this medicine? CANAGLIFLOZIN; METFORMIN (KAN a gli FLOE zin; met FOR min) is a combination of 2 medicines used to treat type 2 diabetes. This medicine lowers blood sugar. Treatment is combined with a balanced diet and exercise. This medicine may be used for other purposes; ask your health care provider or pharmacist if you have questions. What should I tell my health care provider before I take this medicine? They need to know if you have any of these conditions: -anemia -dehydration -diabetic ketoacidosis -diet low in salt -eating less due to illness, surgery, dieting, or any other reason -having surgery -heart disease -high cholesterol -high levels of potassium in the blood -history of pancreatitis or pancreas problems -history of yeast infection of the penis or vagina -if you often drink alcohol -infections in the bladder, kidneys, or urinary tract -kidney disease -liver disease -low blood pressure -on hemodialysis -polycystic ovary syndrome -problems urinating -serious infection or injury -type 1 diabetes -uncircumcised male -vomiting -an unusual or allergic reaction to canagliflozin, metformin, other medicines, foods, dyes, or preservatives -pregnant or trying to get pregnant -breast-feeding How should I use this medicine? Take this medicine by mouth with a glass of water. Take this medicine with food. Follow the directions on the prescription label. Take your doses at regular intervals. Do not take your medicine more often than directed. Do not stop taking except on your doctor's advice. A special MedGuide will be given to you by the pharmacist with each prescription and refill. Be sure to read this information carefully each time. Talk to your  pediatrician regarding the use of this medicine in children. Special care may be needed. Overdosage: If you think you have taken too much of this medicine contact a poison control center or emergency room at once. NOTE: This medicine is only for you. Do not share this medicine with others. What if I miss a dose? If you miss a dose, take it as soon as you can. If it is almost time for your next dose, take only that dose. Do not take double or extra doses. What may interact with this medicine? Do not take this medicine with any of the following medications: -dofetilide -gatifloxacin -certain contrast medicines given before X-rays, CT scans, MRI, or other procedures This medicine may also interact with the following medications: -acetazolamide -alcohol -certain antiviral medicines for HIV infection or hepatitis -cimetidine -crizotinib -digoxin -diuretics -male hormones, like estrogens or progestins and birth control pills -glycopyrrolate -isoniazid -lamotrigine -medicines for blood pressure, heart disease, irregular heart beat -memantine -methazolamide -midodrine -morphine -nicotinic acid -phenobarbital -phenothiazines like chlorpromazine, mesoridazine, prochlorperazine, thioridazine -phenytoin -procainamide -propantheline -quinidine -quinine -ranitidine -ranolazine -rifampin -ritonavir -steroid medicines like prednisone or cortisone -stimulant medicines for attention disorders, weight loss, or to stay awake -thyroid medicines -topiramate -trimethoprim -trospium -vancomycin -vandetanib -zonisamide This list may not describe all possible interactions. Give your health care provider a list of all the medicines, herbs, non-prescription drugs, or dietary supplements you use. Also tell them if you smoke, drink alcohol, or use illegal drugs. Some items may interact with your medicine. What should I watch for while using this medicine? Visit your doctor or health care  professional for regular checks on your progress. This medicine can cause a serious condition in which there is too much acid in  the blood. If you develop nausea, vomiting, stomach pain, unusual tiredness, or breathing problems, stop taking this medicine and call your doctor right away. If possible, use a ketone dipstick to check for ketones in your urine. A test called the HbA1C (A1C) will be monitored. This is a simple blood test. It measures your blood sugar control over the last 2 to 3 months. You will receive this test every 3 to 6 months. Learn how to check your blood sugar. Learn the symptoms of low and high blood sugar and how to manage them. Always carry a quick-source of sugar with you in case you have symptoms of low blood sugar. Examples include hard sugar candy or glucose tablets. Make sure others know that you can choke if you eat or drink when you develop serious symptoms of low blood sugar, such as seizures or unconsciousness. They must get medical help at once. Tell your doctor or health care professional if you have high blood sugar. You might need to change the dose of your medicine. If you are sick or exercising more than usual, you might need to change the dose of your medicine. Do not skip meals. Ask your doctor or health care professional if you should avoid alcohol. Many nonprescription cough and cold products contain sugar or alcohol. These can affect blood sugar. This medicine may cause ovulation in premenopausal women who do not have regular monthly periods. This may increase your chances of becoming pregnant. You should not take this medicine if you become pregnant or think you may be pregnant. Talk with your doctor or health care professional about your birth control options while taking this medicine. Contact your doctor or health care professional right away if think you are pregnant. If you are going to need surgery, a MRI, CT scan, or other procedure, tell your doctor that  you are taking this medicine. You may need to stop taking this medicine before the procedure. Wear a medical ID bracelet or chain, and carry a card that describes your disease and details of your medicine and dosage times. What side effects may I notice from receiving this medicine? Side effects that you should report to your doctor or health care professional as soon as possible: -allergic reactions like skin rash, itching or hives, swelling of the face, lips, or tongue -breathing problems -chest pain -dizziness -feeling faint or lightheaded, falls -muscle aches or pains -muscle weakness -nausea, vomiting, unusual stomach upset or pain -new pain or tenderness, change in skin color, sores or ulcers, or infection in legs or feet -signs and symptoms of low blood sugar such as feeling anxious, confusion, dizziness, increased hunger, unusually weak or tired, sweating, shakiness, cold, irritable, headache, blurred vision, fast heartbeat, loss of consciousness -signs and symptoms of a urinary tract infection, such as fever, chills, a burning feeling when urinating, blood in the urine, back pain -trouble passing urine or change in the amount of urine, including an urgent need to urinate more often, in larger amounts, or at night -penile discharge, itching, or pain in men -slow, irregular heartbeat -unusually tired or weak -vaginal discharge, itching, or odor in women Side effects that usually do not require medical attention (Report these to your doctor or health care professional if they continue or are bothersome.): -constipation -diarrhea -headache -heartburn -mild increase in urination -stomach gas -thirsty This list may not describe all possible side effects. Call your doctor for medical advice about side effects. You may report side effects to FDA at 1-800-FDA-1088. Where  should I keep my medicine? Keep out of the reach of children. Store at room temperature between 15 and 30 degrees C  (59 and 86 degrees F). Throw away any unused medicine after the expiration date. NOTE: This sheet is a summary. It may not cover all possible information. If you have questions about this medicine, talk to your doctor, pharmacist, or health care provider.    2016, Elsevier/Gold Standard. (2014-12-18 14:34:04)

## 2016-05-05 ENCOUNTER — Other Ambulatory Visit: Payer: Self-pay | Admitting: Family Medicine

## 2016-05-05 MED ORDER — FLUOXETINE HCL 20 MG PO TABS: 40.0000 mg | ORAL_TABLET | Freq: Two times a day (BID) | ORAL | 3 refills | Status: DC

## 2016-05-17 ENCOUNTER — Ambulatory Visit (INDEPENDENT_AMBULATORY_CARE_PROVIDER_SITE_OTHER): Payer: BLUE CROSS/BLUE SHIELD | Admitting: Podiatry

## 2016-05-17 ENCOUNTER — Encounter: Payer: Self-pay | Admitting: Podiatry

## 2016-05-17 VITALS — Ht 68.5 in | Wt 194.0 lb

## 2016-05-17 DIAGNOSIS — M216X9 Other acquired deformities of unspecified foot: Secondary | ICD-10-CM | POA: Diagnosis not present

## 2016-05-17 DIAGNOSIS — L600 Ingrowing nail: Secondary | ICD-10-CM

## 2016-05-17 DIAGNOSIS — M79672 Pain in left foot: Secondary | ICD-10-CM

## 2016-05-17 DIAGNOSIS — M79673 Pain in unspecified foot: Secondary | ICD-10-CM

## 2016-05-17 DIAGNOSIS — M79671 Pain in right foot: Secondary | ICD-10-CM

## 2016-05-17 DIAGNOSIS — M216X2 Other acquired deformities of left foot: Secondary | ICD-10-CM

## 2016-05-17 DIAGNOSIS — M2040 Other hammer toe(s) (acquired), unspecified foot: Secondary | ICD-10-CM

## 2016-05-17 DIAGNOSIS — M216X1 Other acquired deformities of right foot: Secondary | ICD-10-CM

## 2016-05-17 NOTE — Patient Instructions (Signed)
Seen for pain in both feet. Noted of tight Achilles tendon on both feet that need be done daily frequent stretch exercise as instructed. Also need custom orthotics. Will call with insurance information.

## 2016-05-17 NOTE — Progress Notes (Signed)
SUBJECTIVE: 55 y.o. year old male presents stating toe nails and toes are growing funny. Toes and toe nails grow inward. Duration of the problem is over 2 years and getting worse with time. The big toe nails are giving a lot of problem.  He came in with tapes wrapped around 4th and 5th digits on both feet. On feet 8 hours at work. Pain on both feet are daily basis.   REVIEW OF SYSTEMS: A comprehensive review of systems was negative except for: Uncontrolled diabetic, Hypertension, Work related depression.  OBJECTIVE: DERMATOLOGIC EXAMINATION: Thick skin build up at distal lateral aspect of 4th and 5th digits bilateral.  Ingrown hallucal nail on both great toe without inflammation.  VASCULAR EXAMINATION OF LOWER LIMBS: All pedal pulses are palpable with normal pulsation.  Capillary Filling times within 3 seconds in all digits.  No edema or erythema noted. Temperature gradient from tibial crest to dorsum of foot is within normal bilateral.  NEUROLOGIC EXAMINATION OF THE LOWER LIMBS: Achilles DTR is present and within normal. Monofilament (Semmes-Weinstein 10-gm) sensory testing positive 6 out of 6, bilateral. Vibratory sensations(128Hz  turning fork) intact at medial and lateral forefoot bilateral.  Sharp and Dull discriminatory sensations at the plantar ball of hallux is intact bilateral.   MUSCULOSKELETAL EXAMINATION: Positive for Tight Achilles tendon bilateral. High arched cavus foot with rearfoot varus, uncompensated. Varus rotated lesser digits 4th and 5th bilateral.  ASSESSMENT: Ankle equinus bilateral. 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 Lateral column weight shifting bilateral. Digital contracture , varus rotation 4th and 5th bilateral. Pain in lower limbs.  PLAN: Reviewed clinical findings and available treatment options. Stretch exercise for Tight Achilles tendon reviewed and instructed to do several times daily. Reviewed need for custom  orthotics. Ingrown nails debrided. Return for orthotic prep.

## 2016-05-30 ENCOUNTER — Emergency Department (INDEPENDENT_AMBULATORY_CARE_PROVIDER_SITE_OTHER): Payer: Self-pay

## 2016-05-30 ENCOUNTER — Emergency Department (INDEPENDENT_AMBULATORY_CARE_PROVIDER_SITE_OTHER)
Admission: EM | Admit: 2016-05-30 | Discharge: 2016-05-30 | Disposition: A | Discharge status: Home / Self Care | Payer: BLUE CROSS/BLUE SHIELD | Source: Home / Self Care | Attending: Family Medicine | Admitting: Family Medicine

## 2016-05-30 ENCOUNTER — Encounter: Payer: Self-pay | Admitting: Emergency Medicine

## 2016-05-30 DIAGNOSIS — M79644 Pain in right finger(s): Secondary | ICD-10-CM | POA: Diagnosis not present

## 2016-05-30 DIAGNOSIS — M7989 Other specified soft tissue disorders: Secondary | ICD-10-CM | POA: Diagnosis not present

## 2016-05-30 DIAGNOSIS — L03011 Cellulitis of right finger: Secondary | ICD-10-CM | POA: Diagnosis not present

## 2016-05-30 MED ORDER — CLINDAMYCIN HCL 300 MG PO CAPS: 300.0000 mg | ORAL_CAPSULE | Freq: Three times a day (TID) | ORAL | 0 refills | Status: DC

## 2016-05-30 NOTE — ED Provider Notes (Signed)
[name]    CSN: [id number] Arrival date & time: 05/30/16  1612     History   Chief Complaint Chief Complaint  Patient presents with  . Hand Pain    HPI Seth Weeks is a 55 y.o. male.   Patient complains of 3 day history of pain and redness over the MCP joint of the right second finger.  He recalls no injury.   The history is provided by the patient.  Hand Pain  This is a new problem. Episode onset: 3 days ago. The problem occurs constantly. The problem has been gradually worsening. The symptoms are aggravated by bending. Nothing relieves the symptoms. He has tried nothing for the symptoms.    Past Medical History:  Diagnosis Date  . Diabetes mellitus without complication ([place])   . High triglycerides   . Hypertension     Patient Active Problem List   Diagnosis Date Noted  . Pain in joint, ankle and foot 05/03/2016  . Skin lesion 03/03/2016  . Anxiety and depression 07/17/2015  . Pes cavus 01/28/2015  . Umbilical hernia 79/89/2119  . Essential hypertension, benign 10/19/2013  . BPV (benign positional vertigo) 07/23/2013  . Paronychia 07/06/2013  . Controlled type 2 diabetes with renal manifestation ([place]) 05/25/2013  . Erectile dysfunction 05/25/2013    Past Surgical History:  Procedure Laterality Date  . TONSILLECTOMY         Home Medications    Prior to Admission medications   Medication Sig Start Date End Date Taking? Authorizing Provider  alprazolam Duanne Moron) 2 MG tablet Take 1 tablet (2 mg total) by mouth 3 (three) times daily as needed for anxiety. 05/03/16   Gregor Hams, MD  AMBULATORY NON FORMULARY MEDICATION Gym membership: use to exercise most days out of the week for treatment of hypertension and hyperglycemia. 10/27/15   Sean Hommel, DO  Canagliflozin-Metformin HCl [phone] MG TABS Take 1 tablet by mouth 2 (two) times daily. 05/03/16   Gregor Hams, MD  clindamycin (CLEOCIN) 300 MG capsule Take 1 capsule (300 mg total) by mouth 3  (three) times daily. (every 8 hours) 05/30/16   Kandra Nicolas, MD  diphenoxylate-atropine (LOMOTIL) 2.5-0.025 MG tablet Take 1 tablet by mouth 4 (four) times daily as needed for diarrhea or loose stools. 05/03/16   Gregor Hams, MD  FLUoxetine (PROZAC) 20 MG tablet Take 2 tablets (40 mg total) by mouth 2 (two) times daily. 05/05/16   Gregor Hams, MD  lactase (LACTAID) 3000 units tablet Take 1 tablet (3,000 Units total) by mouth 3 (three) times daily as needed. 05/03/16   Gregor Hams, MD  loperamide (IMODIUM) 2 MG capsule TAKE ONE CAPSULE BY MOUTH 4 TIMES DAILY AS NEEDED FOR DIARRHEA OR LOOSE STOOLS. 05/03/16   Gregor Hams, MD  tadalafil (CIALIS) 20 MG tablet Take 0.5-1 tablets (10-20 mg total) by mouth as needed for erectile dysfunction. 10/31/15   Marcial Pacas, DO    Family History Family History  Problem Relation Age of Onset  . Heart failure Mother     Social History Social History  Substance Use Topics  . Smoking status: Never Smoker  . Smokeless tobacco: Never Used  . Alcohol use Yes     Allergies   Bee venom; Penicillins; and Terramycin [oxytetracycline]   Review of Systems Review of Systems  All other systems reviewed and are negative.    Physical Exam Triage Vital Signs ED Triage Vitals  Enc Vitals Group  BP 05/30/16 1649 142/77     Pulse Rate 05/30/16 1649 93     Resp --      Temp 05/30/16 1649 98.4 F (36.9 C)     Temp Source 05/30/16 1649 Oral     SpO2 05/30/16 1649 99 %     Weight 05/30/16 1650 193 lb (87.5 kg)     Height 05/30/16 1650 5' 8.5" (1.74 m)     Head Circumference --      Peak Flow --      Pain Score 05/30/16 1651 8     Pain Loc --      Pain Edu? --      Excl. in [place]? --    No data found.   Updated Vital Signs BP 142/77 (BP Location: Left Arm)   Pulse 93   Temp 98.4 F (36.9 C) (Oral)   Ht 5' 8.5" (1.74 m)   Wt 193 lb (87.5 kg)   SpO2 99%   BMI 28.92 kg/m   Visual Acuity Right Eye Distance:   Left Eye Distance:   Bilateral  Distance:    Right Eye Near:   Left Eye Near:    Bilateral Near:     Physical Exam  Constitutional: He appears well-developed and well-nourished. No distress.  HENT:  Head: Normocephalic.  Eyes: Pupils are equal, round, and reactive to light.  Cardiovascular: Regular rhythm.   Pulmonary/Chest: Effort normal.  Musculoskeletal:       Hands: Right second finger proximal phalanx has 48mm diameter area of erythema, tenderness/swelling.  Second finger has full range of motion; distal neurovascular function is intact.   Neurological: He is alert.  Skin: Skin is warm and dry.  Nursing note and vitals reviewed.    UC Treatments / Results  Labs (all labs ordered are listed, but only abnormal results are displayed) Labs Reviewed - No data to display  EKG  EKG Interpretation None       Radiology No results found.  Procedures Procedures (including critical care time)  Medications Ordered in UC Medications - No data to display   Initial Impression / Assessment and Plan / UC Course  I have reviewed the triage vital signs and the nursing notes.  Pertinent labs & imaging results that were available during my care of the patient were reviewed by me and considered in my medical decision making (see chart for details).  Clinical Course  Begin clindamycin 300mg  TID for staph coverage. Begin warm soaks 2 or 3 times daily. Followup with Family Doctor if not improved in 5 days.    Final Clinical Impressions(s) / UC Diagnoses   Final diagnoses:  Cellulitis of right index finger    New Prescriptions New Prescriptions   CLINDAMYCIN (CLEOCIN) 300 MG CAPSULE    Take 1 capsule (300 mg total) by mouth 3 (three) times daily. (every 8 hours)     Kandra Nicolas, MD 06/06/16 2151

## 2016-05-30 NOTE — ED Triage Notes (Signed)
Pt states that he is having right hand pain since Thursday at work.  There is no specific injury but there is more pain, tenderness in the right index finger.  Some redness near the knuckle of the finger.

## 2016-05-30 NOTE — Discharge Instructions (Signed)
Begin warm soaks 2 or 3 times daily.

## 2016-06-18 ENCOUNTER — Telehealth: Payer: Self-pay | Admitting: Family Medicine

## 2016-06-18 NOTE — Telephone Encounter (Signed)
Pt called and stated that the metformin is causing him to urinate more, which is a problem for his job because he cant just up and go when he wants/needs. He wants to know if there is another type of medication he can take in place of this that wont have those same side effect. Thanks

## 2016-06-21 NOTE — Telephone Encounter (Signed)
Its probably the invokana component that is doing it.  There are other options but this will require a longer conversation than we can do over the phone.  Please schedule a follow up visit.

## 2016-06-23 ENCOUNTER — Ambulatory Visit (INDEPENDENT_AMBULATORY_CARE_PROVIDER_SITE_OTHER): Payer: BLUE CROSS/BLUE SHIELD | Admitting: Podiatry

## 2016-06-23 ENCOUNTER — Encounter: Payer: Self-pay | Admitting: Podiatry

## 2016-06-23 ENCOUNTER — Encounter: Payer: Self-pay | Admitting: *Deleted

## 2016-06-23 ENCOUNTER — Emergency Department (INDEPENDENT_AMBULATORY_CARE_PROVIDER_SITE_OTHER)
Admission: EM | Admit: 2016-06-23 | Discharge: 2016-06-23 | Disposition: A | Discharge status: Home / Self Care | Payer: BLUE CROSS/BLUE SHIELD | Source: Home / Self Care | Attending: Family Medicine | Admitting: Family Medicine

## 2016-06-23 VITALS — BP 147/93 | HR 83 | Ht 68.5 in | Wt 189.0 lb

## 2016-06-23 DIAGNOSIS — M7741 Metatarsalgia, right foot: Secondary | ICD-10-CM

## 2016-06-23 DIAGNOSIS — M79672 Pain in left foot: Secondary | ICD-10-CM | POA: Diagnosis not present

## 2016-06-23 DIAGNOSIS — M7742 Metatarsalgia, left foot: Secondary | ICD-10-CM

## 2016-06-23 DIAGNOSIS — M79671 Pain in right foot: Secondary | ICD-10-CM | POA: Diagnosis not present

## 2016-06-23 DIAGNOSIS — J069 Acute upper respiratory infection, unspecified: Secondary | ICD-10-CM | POA: Diagnosis not present

## 2016-06-23 DIAGNOSIS — B9789 Other viral agents as the cause of diseases classified elsewhere: Secondary | ICD-10-CM | POA: Diagnosis not present

## 2016-06-23 DIAGNOSIS — M216X2 Other acquired deformities of left foot: Secondary | ICD-10-CM

## 2016-06-23 DIAGNOSIS — M216X1 Other acquired deformities of right foot: Secondary | ICD-10-CM

## 2016-06-23 DIAGNOSIS — M2040 Other hammer toe(s) (acquired), unspecified foot: Secondary | ICD-10-CM | POA: Diagnosis not present

## 2016-06-23 MED ORDER — FLUTICASONE PROPIONATE 50 MCG/ACT NA SUSP: 2.0000 | Freq: Every day | NASAL | 2 refills | Status: DC

## 2016-06-23 MED ORDER — BENZONATATE 100 MG PO CAPS: 100.0000 mg | ORAL_CAPSULE | Freq: Three times a day (TID) | ORAL | 0 refills | Status: DC

## 2016-06-23 MED ORDER — GUAIFENESIN ER 600 MG PO TB12: 600.0000 mg | ORAL_TABLET | Freq: Two times a day (BID) | ORAL | 1 refills | Status: DC

## 2016-06-23 MED ORDER — IBUPROFEN 400 MG PO TABS
400.0000 mg | ORAL_TABLET | Freq: Once | ORAL | Status: AC
  Administered 2016-06-23: 400 mg via ORAL

## 2016-06-23 MED ORDER — AZITHROMYCIN 250 MG PO TABS: 250.0000 mg | ORAL_TABLET | Freq: Every day | ORAL | 0 refills | Status: DC

## 2016-06-23 NOTE — Patient Instructions (Signed)
Both feet casted for orthotics. Continue with stretch exercise for tight Achilles tendon bilateral.

## 2016-06-23 NOTE — Progress Notes (Signed)
SUBJECTIVE: 55 y.o. year old male presents requesting custom orthotics made. He just came from work. He came in with tapes wrapped around 4th and 5th digits on both feet. On feet 8 hours at work. Pain on both feet are daily basis.   OBJECTIVE: DERMATOLOGIC EXAMINATION: No abnormal findings.   VASCULAR EXAMINATION OF LOWER LIMBS: All pedal pulses are palpable with normal pulsation.  Capillary Filling times within 3 seconds in all digits.  No edema or erythema noted. Temperature gradient from tibial crest to dorsum of foot is within normal bilateral.  NEUROLOGIC EXAMINATION OF THE LOWER LIMBS: All epicritic and tactile sensations grossly intact.   MUSCULOSKELETAL EXAMINATION: Positive for Tight Achilles tendon bilateral. High arched cavus foot with rearfoot varus, uncompensated. Varus rotated lesser digits 4th and 5th bilateral.  Radiographic examination reveal: AP View; Short first metatarsal (- 4), Fibular sesamoid position at 4, minimum change in lateral deviation angle of the CCJ varus rotated 4th and 5th digits bilateral. Lateral view: High arched foot with elevated first metatarsal bone. Small posterior calcaneal spur bilateral.   ASSESSMENT: Ankle equinus bilateral. 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 Lesser metatarsalgia from lateral column weight shifting bilateral. Digital contracture , varus rotation 4th and 5th bilateral. Pain in lower limbs.  PLAN: Reviewed clinical findings and available treatment options. Continue with stretch exercise for Tight Achilles tendon. Instructed to do several times daily. Both feet casted for custom orthotics. Coban bandage dispensed to use digital wrap. Will call when orthotics are ready.

## 2016-06-23 NOTE — ED Provider Notes (Signed)
CSN: [id number]     Arrival date & time 06/23/16  1048 History   First MD Initiated Contact with Patient 06/23/16 1107     Chief Complaint  Patient presents with  . Sinus Problem  . Cough   (Consider location/radiation/quality/duration/timing/severity/associated sxs/prior Treatment) HPI Seth Weeks is a 55 y.o. male presenting to UC with c/o 2-3 days of body aches, mildly productive cough, sore throat, sinus pain and pressure. He has not tried any OTC medications yet as they were out of plain Robitussin at the store and Robitussin-DM makes him drowsy.  He notes he got a Z-pak a few months ago and felt better quickly after taking the antibiotic. Denies fever, chills, n/v/d. He has been around others who have been sick but no known contacts with people with the flu.  He did not get the flu vaccine this season.     Past Medical History:  Diagnosis Date  . Diabetes mellitus without complication ([place])   . High triglycerides   . Hypertension    Past Surgical History:  Procedure Laterality Date  . TONSILLECTOMY     Family History  Problem Relation Age of Onset  . Heart failure Mother    Social History  Substance Use Topics  . Smoking status: Never Smoker  . Smokeless tobacco: Never Used  . Alcohol use Yes    Review of Systems  Constitutional: Positive for fatigue. Negative for chills and fever.  HENT: Positive for congestion, rhinorrhea, sinus pressure and sore throat. Negative for ear pain, trouble swallowing and voice change.   Respiratory: Positive for cough. Negative for shortness of breath.   Cardiovascular: Negative for chest pain and palpitations.  Gastrointestinal: Negative for abdominal pain, diarrhea, nausea and vomiting.  Musculoskeletal: Positive for arthralgias and myalgias. Negative for back pain.       Body aches  Skin: Negative for rash.  Neurological: Positive for headaches. Negative for dizziness and light-headedness.    Allergies  Bee venom; Penicillins;  and Terramycin [oxytetracycline]  Home Medications   Prior to Admission medications   Medication Sig Start Date End Date Taking? Authorizing Provider  alprazolam Duanne Moron) 2 MG tablet Take 1 tablet (2 mg total) by mouth 3 (three) times daily as needed for anxiety. 05/03/16   Gregor Hams, MD  AMBULATORY NON FORMULARY MEDICATION Gym membership: use to exercise most days out of the week for treatment of hypertension and hyperglycemia. 10/27/15   Sean Hommel, DO  azithromycin (ZITHROMAX) 250 MG tablet Take 1 tablet (250 mg total) by mouth daily. Take first 2 tablets together, then 1 every day until finished. 06/23/16   Noland Fordyce, PA-C  benzonatate (TESSALON) 100 MG capsule Take 1-2 capsules (100-200 mg total) by mouth every 8 (eight) hours. 06/23/16   Noland Fordyce, PA-C  Canagliflozin-Metformin HCl [phone] MG TABS Take 1 tablet by mouth 2 (two) times daily. 05/03/16   Gregor Hams, MD  diphenoxylate-atropine (LOMOTIL) 2.5-0.025 MG tablet Take 1 tablet by mouth 4 (four) times daily as needed for diarrhea or loose stools. 05/03/16   Gregor Hams, MD  FLUoxetine (PROZAC) 20 MG tablet Take 2 tablets (40 mg total) by mouth 2 (two) times daily. 05/05/16   Gregor Hams, MD  fluticasone (FLONASE) 50 MCG/ACT nasal spray Place 2 sprays into both nostrils daily. 06/23/16   Noland Fordyce, PA-C  guaiFENesin (MUCINEX) 600 MG 12 hr tablet Take 1-2 tablets (600-1,200 mg total) by mouth 2 (two) times daily. 06/23/16   Noland Fordyce, PA-C  lactase (LACTAID)  3000 units tablet Take 1 tablet (3,000 Units total) by mouth 3 (three) times daily as needed. 05/03/16   Gregor Hams, MD  loperamide (IMODIUM) 2 MG capsule TAKE ONE CAPSULE BY MOUTH 4 TIMES DAILY AS NEEDED FOR DIARRHEA OR LOOSE STOOLS. 05/03/16   Gregor Hams, MD  tadalafil (CIALIS) 20 MG tablet Take 0.5-1 tablets (10-20 mg total) by mouth as needed for erectile dysfunction. 10/31/15   Marcial Pacas, DO   Meds Ordered and Administered this Visit   Medications   ibuprofen (ADVIL,MOTRIN) tablet 400 mg (400 mg Oral Given 06/23/16 1111)    BP 164/94 (BP Location: Left Arm)   Pulse 84   Temp 97.8 F (36.6 C) (Oral)   Resp 16   Wt 188 lb (85.3 kg)   SpO2 97%   BMI 28.17 kg/m  No data found.   Physical Exam  Constitutional: He appears well-developed and well-nourished. No distress.  HENT:  Head: Normocephalic and atraumatic.  Right Ear: Tympanic membrane normal.  Left Ear: Tympanic membrane normal.  Nose: Mucosal edema present. Right sinus exhibits no maxillary sinus tenderness and no frontal sinus tenderness. Left sinus exhibits no maxillary sinus tenderness and no frontal sinus tenderness.  Mouth/Throat: Uvula is midline, oropharynx is clear and moist and mucous membranes are normal.  Eyes: Conjunctivae are normal. No scleral icterus.  Neck: Normal range of motion. Neck supple.  Cardiovascular: Normal rate, regular rhythm and normal heart sounds.   Pulmonary/Chest: Effort normal and breath sounds normal. No stridor. No respiratory distress. He has no wheezes. He has no rales.  Musculoskeletal: Normal range of motion.  Lymphadenopathy:    He has no cervical adenopathy.  Neurological: He is alert.  Skin: Skin is warm and dry. He is not diaphoretic.  Nursing note and vitals reviewed.   Urgent Care Course   Clinical Course     Procedures (including critical care time)  Labs Review Labs Reviewed - No data to display  Imaging Review No results found.   MDM   1. Viral URI with cough    Pt c/o 2-3 days of URI symptoms that are gradually worsening.   No evidence of bacterial infection at this time. Symptoms likely viral. Pt declined flu test. Encouraged symptomatic treatment.  Rx: Mucinex, Flonase, and Tessalon. Prescription to hold with expiration date for azithromycin. Home care instructions provided.  BP is elevated- encouraged to monitor. Per medical records pt has hx of HTN but is not on medication.    Noland Fordyce,  PA-C 06/23/16 [place], PA-C 06/23/16 1134

## 2016-06-23 NOTE — ED Triage Notes (Signed)
Patient c/o 203 days of cough and sinus pain and congestion. Afebrile.

## 2016-06-23 NOTE — Discharge Instructions (Signed)
°  Your symptoms are likely due to a virus such as the common cold, however, if you developing worsening chest congestion with shortness of breath, persistent fever for 3 days, or symptoms not improving in 4-5 days, you may fill the antibiotic (azithromycin).  If you do fill the antibiotic,  please take antibiotics as prescribed and be sure to complete entire course even if you start to feel better to ensure infection does not come back.

## 2016-06-28 ENCOUNTER — Encounter: Payer: Self-pay | Admitting: Family Medicine

## 2016-06-28 ENCOUNTER — Ambulatory Visit (INDEPENDENT_AMBULATORY_CARE_PROVIDER_SITE_OTHER): Payer: BLUE CROSS/BLUE SHIELD | Admitting: Family Medicine

## 2016-06-28 VITALS — BP 110/69 | HR 69 | Wt 189.0 lb

## 2016-06-28 DIAGNOSIS — F419 Anxiety disorder, unspecified: Secondary | ICD-10-CM

## 2016-06-28 DIAGNOSIS — M7542 Impingement syndrome of left shoulder: Secondary | ICD-10-CM | POA: Diagnosis not present

## 2016-06-28 DIAGNOSIS — R809 Proteinuria, unspecified: Secondary | ICD-10-CM | POA: Diagnosis not present

## 2016-06-28 DIAGNOSIS — F418 Other specified anxiety disorders: Secondary | ICD-10-CM

## 2016-06-28 DIAGNOSIS — M7541 Impingement syndrome of right shoulder: Secondary | ICD-10-CM | POA: Insufficient documentation

## 2016-06-28 DIAGNOSIS — E1129 Type 2 diabetes mellitus with other diabetic kidney complication: Secondary | ICD-10-CM

## 2016-06-28 DIAGNOSIS — F329 Major depressive disorder, single episode, unspecified: Secondary | ICD-10-CM

## 2016-06-28 DIAGNOSIS — F32A Depression, unspecified: Secondary | ICD-10-CM

## 2016-06-28 MED ORDER — SITAGLIPTIN PHOS-METFORMIN HCL 50-1000 MG PO TABS: 1.0000 | ORAL_TABLET | Freq: Two times a day (BID) | ORAL | 1 refills | Status: DC

## 2016-06-28 NOTE — Progress Notes (Signed)
Seth Weeks is a 55 y.o. male who presents to [facility] today for follow-up diabetes anxiety depression and left shoulder pain.  Diabetes: Patient takes Advil, daily. He notes this has controlled his diabetes reasonably well however causes significant urination. He finds this to be very problematic at work. 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 He finds this to be very distressing.    Anxiety and depression: Patient notes continued bothersome anxiety and depression symptoms. He finds his diabetes very frustrating. He has not yet called for a psychiatrist to make an appointment. He notes he has to take Xanax multiple times a day to cope.  Left shoulder pain: Patient notes months long history of ongoing left shoulder pain. Pain is moderate and worse with overhead motion reaching back. Pain does occasionally interfere with his ability to work and especially interferes with his ability to exercise. He denies significant pain radiating p.m. the level of the shoulder.     Past Medical History:  Diagnosis Date  . Diabetes mellitus without complication ([place])   . High triglycerides   . Hypertension    Past Surgical History:  Procedure Laterality Date  . TONSILLECTOMY     Social History  Substance Use Topics  . Smoking status: Never Smoker  . Smokeless tobacco: Never Used  . Alcohol use Yes   family history includes Heart failure in his mother.  ROS as above:  Medications: Current Outpatient Prescriptions  Medication Sig Dispense Refill  . alprazolam (XANAX) 2 MG tablet Take 1 tablet (2 mg total) by mouth 3 (three) times daily as needed for anxiety. 90 tablet 1  . AMBULATORY NON FORMULARY MEDICATION Gym membership: use to exercise most days out of the week for treatment of hypertension and hyperglycemia. 1  Units 0  . azithromycin (ZITHROMAX) 250 MG tablet Take 1 tablet (250 mg total) by mouth daily. Take first 2 tablets together, then 1 every day until finished. 6 tablet 0  . benzonatate (TESSALON) 100 MG capsule Take 1-2 capsules (100-200 mg total) by mouth every 8 (eight) hours. 21 capsule 0  . diphenoxylate-atropine (LOMOTIL) 2.5-0.025 MG tablet Take 1 tablet by mouth 4 (four) times daily as needed for diarrhea or loose stools. 120 tablet 3  . FLUoxetine (PROZAC) 20 MG tablet Take 2 tablets (40 mg total) by mouth 2 (two) times daily. 120 tablet 3  . fluticasone (FLONASE) 50 MCG/ACT nasal spray Place 2 sprays into both nostrils daily. 15.8 g 2  . guaiFENesin (MUCINEX) 600 MG 12 hr tablet Take 1-2 tablets (600-1,200 mg total) by mouth 2 (two) times daily. 30 tablet 1  . lactase (LACTAID) 3000 units tablet Take 1 tablet (3,000 Units total) by mouth 3 (three) times daily as needed. 120 tablet 3  . loperamide (IMODIUM) 2 MG capsule TAKE ONE CAPSULE BY MOUTH 4 TIMES DAILY AS NEEDED FOR DIARRHEA OR LOOSE STOOLS. 90 capsule 3  . tadalafil (CIALIS) 20 MG tablet Take 0.5-1 tablets (10-20 mg total) by mouth as needed for erectile dysfunction. 5 tablet 11  . sitaGLIPtin-metformin (JANUMET) 50-1000 MG tablet Take 1 tablet by mouth 2 (two) times daily with a meal. 60 tablet 1   No current facility-administered medications for this visit.    Allergies  Allergen Reactions  . Bee Venom Anaphylaxis  . Penicillins   . Terramycin [Oxytetracycline]     Health Maintenance Health  Maintenance  Topic Date Due  . Hepatitis C Screening  06[birth year]04/1961  . OPHTHALMOLOGY EXAM  11/03/1970  . HIV Screening  11/03/1975  . COLONOSCOPY  11/03/2010  . HEMOGLOBIN A1C  11/06/2015  . INFLUENZA VACCINE  05/03/2017 (Originally 03/02/2016)  . PNEUMOCOCCAL POLYSACCHARIDE VACCINE (1) 05/03/2018 (Originally 11/03/1962)  . FOOT EXAM  05/03/2017  . URINE MICROALBUMIN  05/03/2017  . TETANUS/TDAP  08/02/2021     Exam:  BP 110/69    Pulse 69   Wt 189 lb (85.7 kg)   BMI 28.32 kg/m  Gen: Well NAD HEENT: EOMI,  MMM Lungs: Normal work of breathing. CTABL Heart: RRR no MRG Abd: NABS, Soft. Nondistended, Nontender Exts: Brisk capillary refill, warm and well perfused.  Psych: Alert and oriented normal speech thought process and affect. No SI or HI expressed. Left shoulder: Normal-appearing nontender. Pain with abduction.  Range of motion normal abduction and external rotation.  Internal rotation limited to the lumbar spine. Positive Hawkins and Neer's test. Positive empty can test.  Normal strength.  Negative Yergason's and speeds test.   Depression screen [facility] 2/9 06/28/2016 05/03/2016  Decreased Interest 1 2  Down, Depressed, Hopeless 1 3  PHQ - 2 Score 2 5  Altered sleeping 3 3  Tired, decreased energy 3 3  Change in appetite 2 2  Feeling bad or failure about yourself  3 3  Trouble concentrating 1 2  Moving slowly or fidgety/restless 1 2  Suicidal thoughts 2 2  PHQ-9 Score 17 22  Difficult doing work/chores Very difficult Very difficult   GAD 7 : Generalized Anxiety Score 06/28/2016 05/03/2016  Nervous, Anxious, on Edge 3 3  Control/stop worrying 3 3  Worry too much - different things 3 3  Trouble relaxing 3 3  Restless 3 2  Easily annoyed or irritable 3 3  Afraid - awful might happen 3 3  Total GAD 7 Score 21 20  Anxiety Difficulty Somewhat difficult Very difficult       No results found for this or any previous visit (from the past 55 hour(s)). No results found.    Assessment and Plan: 55 y.o. male with  Diabetes: Not very well controlled. Discontinue invoke a met and start Janumet. Recheck in about a month.    Anxiety and depression: Not well controlled. Patient uses excessive amount of benzodiazepines and is not very well controlled despite this. Refer to psychiatry. We'll stop prescribing Xanax in the near future.    Left shoulder pain: Likely rotator cuff tendinitis of shoulder  impingement. Would like to avoid steroid injections due to diabetes. Plan at home exercise program. Recheck in a month or so.   Orders Placed This Encounter  Procedures  . Ambulatory referral to Psychiatry    Referral Priority:   Routine    Referral Type:   Psychiatric    Referral Reason:   Specialty Services Required    Requested Specialty:   Psychiatry    Number of Visits Requested:   1    Discussed warning signs or symptoms. Please see discharge instructions. Patient expresses understanding.

## 2016-06-28 NOTE — Patient Instructions (Signed)
Thank you for coming in today. You should hear from psychiatry soon.  Do the home exercises.  Return in 1 month or sooner if needed.  STOP invokana.  Start Januvia   Shoulder Impingement Syndrome Rehab Ask your health care provider which exercises are safe for you. Do exercises exactly as told by your health care provider and adjust them as directed. It is normal to feel mild stretching, pulling, tightness, or discomfort as you do these exercises, but you should stop right away if you feel sudden pain or your pain gets worse.Do not begin these exercises until told by your health care provider. Stretching and range of motion exercise This exercise warms up your muscles and joints and improves the movement and flexibility of your shoulder. This exercise also helps to relieve pain and stiffness. Exercise A: Passive horizontal adduction 1. Sit or stand and pull your left / right elbow across your chest, toward your other shoulder. Stop when you feel a gentle stretch in the back of your shoulder and upper arm.  Keep your arm at shoulder height.  Keep your arm as close to your body as you comfortably can. 2. Hold for __________ seconds. 3. Slowly return to the starting position. Repeat __________ times. Complete this exercise __________ times a day. Strengthening exercises These exercises build strength and endurance in your shoulder. Endurance is the ability to use your muscles for a long time, even after they get tired. Exercise B: External rotation, isometric 1. Stand or sit in a doorway, facing the door frame. 2. Bend your left / right elbow and place the back of your wrist against the door frame. Only your wrist should be touching the frame. Keep your upper arm at your side. 3. Gently press your wrist against the door frame, as if you are trying to push your arm away from your abdomen.  Avoid shrugging your shoulder while you press your hand against the door frame. Keep your shoulder  blade tucked down toward the middle of your back. 4. Hold for __________ seconds. 5. Slowly release the tension, and relax your muscles completely before you do the exercise again. Repeat __________ times. Complete this exercise __________ times a day. Exercise C: Internal rotation, isometric 1. Stand or sit in a doorway, facing the door frame. 2. Bend your left / right elbow and place the inside of your wrist against the door frame. Only your wrist should be touching the frame. Keep your upper arm at your side. 3. Gently press your wrist against the door frame, as if you are trying to push your arm toward your abdomen.  Avoid shrugging your shoulder while you press your hand against the door frame. Keep your shoulder blade tucked down toward the middle of your back. 4. Hold for __________ seconds. 5. Slowly release the tension, and relax your muscles completely before you do the exercise again. Repeat __________ times. Complete this exercise __________ times a day. Exercise D: Scapular protraction, supine 1. Lie on your back on a firm surface. Hold a __________ weight in your left / right hand. 2. Raise your left / right arm straight into the air so your hand is directly above your shoulder joint. 3. Push the weight into the air so your shoulder lifts off of the surface that you are lying on. Do not move your head, neck, or back. 4. Hold for __________ seconds. 5. Slowly return to the starting position. Let your muscles relax completely before you repeat this exercise. Repeat __________  times. Complete this exercise __________ times a day. Exercise E: Scapular retraction 1. Sit in a stable chair without armrests, or stand. 2. Secure an exercise band to a stable object in front of you so the band is at shoulder height. 3. Hold one end of the exercise band in each hand. Your palms should face down. 4. Squeeze your shoulder blades together and move your elbows slightly behind you. Do not shrug  your shoulders while you do this. 5. Hold for __________ seconds. 6. Slowly return to the starting position. Repeat __________ times. Complete this exercise __________ times a day. Exercise F: Shoulder extension 1. Sit in a stable chair without armrests, or stand. 2. Secure an exercise band to a stable object in front of you where the band is above shoulder height. 3. Hold one end of the exercise band in each hand. 4. Straighten your elbows and lift your hands up to shoulder height. 5. Squeeze your shoulder blades together and pull your hands down to the sides of your thighs. Stop when your hands are straight down by your sides. Do not let your hands go behind your body. 6. Hold for __________ seconds. 7. Slowly return to the starting position. Repeat __________ times. Complete this exercise __________ times a day. This information is not intended to replace advice given to you by your health care provider. Make sure you discuss any questions you have with your health care provider. Document Released: 07/19/2005 Document Revised: 03/25/2016 Document Reviewed: 06/21/2015 Elsevier Interactive Patient Education  2017 [employer].

## 2016-06-30 ENCOUNTER — Ambulatory Visit (INDEPENDENT_AMBULATORY_CARE_PROVIDER_SITE_OTHER): Payer: BLUE CROSS/BLUE SHIELD | Admitting: Family Medicine

## 2016-06-30 ENCOUNTER — Encounter: Payer: Self-pay | Admitting: Family Medicine

## 2016-06-30 VITALS — BP 123/67 | HR 91 | Temp 98.1°F | Wt 189.0 lb

## 2016-06-30 DIAGNOSIS — R05 Cough: Secondary | ICD-10-CM

## 2016-06-30 DIAGNOSIS — R059 Cough, unspecified: Secondary | ICD-10-CM

## 2016-06-30 DIAGNOSIS — R809 Proteinuria, unspecified: Secondary | ICD-10-CM | POA: Diagnosis not present

## 2016-06-30 DIAGNOSIS — E1129 Type 2 diabetes mellitus with other diabetic kidney complication: Secondary | ICD-10-CM | POA: Diagnosis not present

## 2016-06-30 DIAGNOSIS — F419 Anxiety disorder, unspecified: Secondary | ICD-10-CM

## 2016-06-30 DIAGNOSIS — F329 Major depressive disorder, single episode, unspecified: Secondary | ICD-10-CM

## 2016-06-30 DIAGNOSIS — F418 Other specified anxiety disorders: Secondary | ICD-10-CM

## 2016-06-30 DIAGNOSIS — F32A Depression, unspecified: Secondary | ICD-10-CM

## 2016-06-30 MED ORDER — AZITHROMYCIN 250 MG PO TABS: 250.0000 mg | ORAL_TABLET | Freq: Every day | ORAL | 0 refills | Status: DC

## 2016-06-30 MED ORDER — OMEPRAZOLE 40 MG PO CPDR: 40.0000 mg | DELAYED_RELEASE_CAPSULE | Freq: Every day | ORAL | 3 refills | Status: DC

## 2016-06-30 MED ORDER — ALPRAZOLAM 2 MG PO TABS: 2.0000 mg | ORAL_TABLET | Freq: Three times a day (TID) | ORAL | 1 refills | Status: DC | PRN

## 2016-06-30 MED ORDER — GUAIFENESIN-CODEINE 100-10 MG/5ML PO SOLN: 5.0000 mL | Freq: Every evening | ORAL | 1 refills | Status: DC | PRN

## 2016-06-30 MED ORDER — BENZONATATE 200 MG PO CAPS: 200.0000 mg | ORAL_CAPSULE | Freq: Three times a day (TID) | ORAL | 3 refills | Status: DC | PRN

## 2016-06-30 NOTE — Progress Notes (Signed)
Seth Weeks is a 55 y.o. male who presents to [facility] today for cough. Patient notes continued nonproductive bothersome cough. He denies any fevers or chills or significant shortness of breath or wheezing. He has tried using some over-the-counter medications which have helped. He was seen in urgent care about a week ago where he was given is an excellent days and Tessalon Perles. These helped a bit. He notes his symptoms do persist and are very bothersome.  Additionally he notes continued diabetes symptoms. He has FMLA paperwork to be filled out today for work.   Past Medical History:  Diagnosis Date  . Diabetes mellitus without complication ([place])   . High triglycerides   . Hypertension    Past Surgical History:  Procedure Laterality Date  . TONSILLECTOMY     Social History  Substance Use Topics  . Smoking status: Never Smoker  . Smokeless tobacco: Never Used  . Alcohol use Yes   family history includes Heart failure in his mother.  ROS as above:  Medications: Current Outpatient Prescriptions  Medication Sig Dispense Refill  . alprazolam (XANAX) 2 MG tablet Take 1 tablet (2 mg total) by mouth 3 (three) times daily as needed for anxiety. 90 tablet 1  . AMBULATORY NON FORMULARY MEDICATION Gym membership: use to exercise most days out of the week for treatment of hypertension and hyperglycemia. 1 Units 0  . diphenoxylate-atropine (LOMOTIL) 2.5-0.025 MG tablet Take 1 tablet by mouth 4 (four) times daily as needed for diarrhea or loose stools. 120 tablet 3  . FLUoxetine (PROZAC) 20 MG tablet Take 2 tablets (40 mg total) by mouth 2 (two) times daily. 120 tablet 3  . lactase (LACTAID) 3000 units tablet Take 1 tablet (3,000 Units total) by mouth 3 (three) times daily as needed. 120 tablet 3  . loperamide (IMODIUM) 2 MG capsule TAKE ONE CAPSULE BY MOUTH 4 TIMES  DAILY AS NEEDED FOR DIARRHEA OR LOOSE STOOLS. 90 capsule 3  . sitaGLIPtin-metformin (JANUMET) 50-1000 MG tablet Take 1 tablet by mouth 2 (two) times daily with a meal. 60 tablet 1  . tadalafil (CIALIS) 20 MG tablet Take 0.5-1 tablets (10-20 mg total) by mouth as needed for erectile dysfunction. 5 tablet 11  . azithromycin (ZITHROMAX) 250 MG tablet Take 1 tablet (250 mg total) by mouth daily. Take first 2 tablets together, then 1 every day until finished. 6 tablet 0  . benzonatate (TESSALON) 200 MG capsule Take 1 capsule (200 mg total) by mouth 3 (three) times daily as needed for cough. 45 capsule 3  . guaiFENesin-codeine 100-10 MG/5ML syrup Take 5 mLs by mouth at bedtime as needed for cough. 120 mL 1  . omeprazole (PRILOSEC) 40 MG capsule Take 1 capsule (40 mg total) by mouth daily. 30 capsule 3   No current facility-administered medications for this visit.    Allergies  Allergen Reactions  . Bee Venom Anaphylaxis  . Penicillins   . Terramycin [Oxytetracycline]     Health Maintenance Health Maintenance  Topic Date Due  . Hepatitis C Screening  06-06-1961  . OPHTHALMOLOGY EXAM  11/03/1970  . HIV Screening  11/03/1975  . COLONOSCOPY  11/03/2010  . HEMOGLOBIN A1C  11/06/2015  . INFLUENZA VACCINE  05/03/2017 (Originally 03/02/2016)  . PNEUMOCOCCAL POLYSACCHARIDE VACCINE (1) 05/03/2018 (Originally 11/03/1962)  . FOOT EXAM  05/03/2017  . URINE MICROALBUMIN  05/03/2017  . TETANUS/TDAP  08/02/2021     Exam:  BP 123/67  Pulse 91   Temp 98.1 F (36.7 C) (Oral)   Wt 189 lb (85.7 kg)   BMI 28.32 kg/m  Gen: Well NAD HEENT: EOMI,  MMM Clear nasal discharge. Posterior pharynx with cobblestoning. Normal tympanic membranes bilaterally. Minimal cervical lymphadenopathy present bilaterally. Lungs: Normal work of breathing. CTABL Heart: RRR no MRG Abd: NABS, Soft. Nondistended, Nontender Exts: Brisk capillary refill, warm and well perfused.    No results found for this or any previous visit  (from the past 72 hour(s)). No results found.    Assessment and Plan: 55 y.o. male with viral bronchitis. Treat with Tessalon Perles. Use codeine cough syrup as needed at night. Uses azithromycin and Atrovent nasal spray. Additionally we'll treat with omeprazole for possible silent reflux.  FMLA paperwork filled out for diabetes.  Additionally Xanax refilled as part of continued anxiety and depression care.   No orders of the defined types were placed in this encounter.   Discussed warning signs or symptoms. Please see discharge instructions. Patient expresses understanding.

## 2016-06-30 NOTE — Patient Instructions (Signed)
Thank you for coming in today. Take tessalon during the working day.  Use codeine at bed time.  Take azithromycin if worse.  Use the nasal spray.  Also take omeprazole.   We will keep an eye on the finger bump.

## 2016-07-02 IMAGING — CT CT ABD-PELV W/ CM
3 of 5 series · 14 of 32 positions shown, 19 images · IV contrast (omnipaque)
Comparison: None.

CLINICAL DATA: Umbilical hernia repair. Intermittent pain times 2-3
weeks.

EXAM:
CT ABDOMEN AND PELVIS WITH CONTRAST
TECHNIQUE: Multidetector CT imaging of the abdomen and pelvis was performed
using the standard protocol following bolus administration of
intravenous contrast.
CONTRAST:  100mL OMNIPAQUE IOHEXOL 300 MG/ML  SOLN

[Series 3: abd/pelvis with · axial · 0.78mm/px · z∈[-395,-100]mm · 4 of 99 slices shown, 9 images]
[im 20/99  soft-tissue]
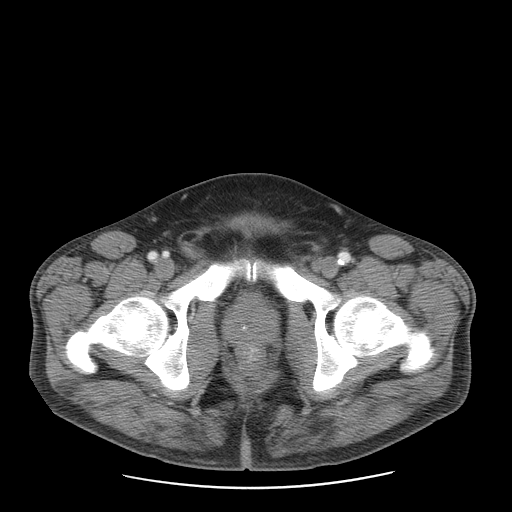
[im 20/99  lung]
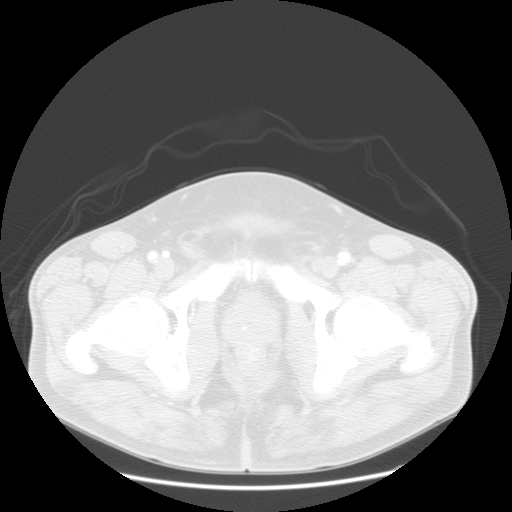
[im 20/99  bone]
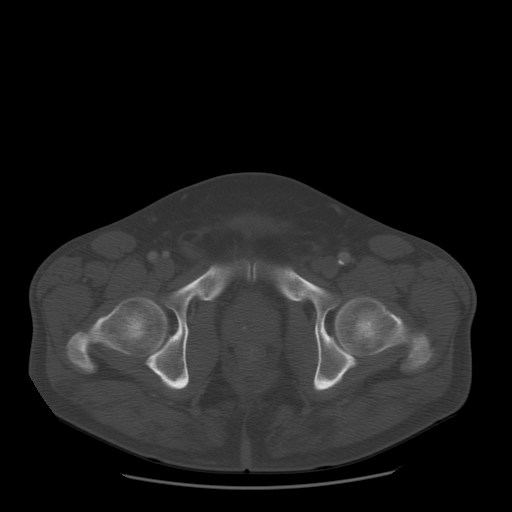
[im 40/99  soft-tissue]
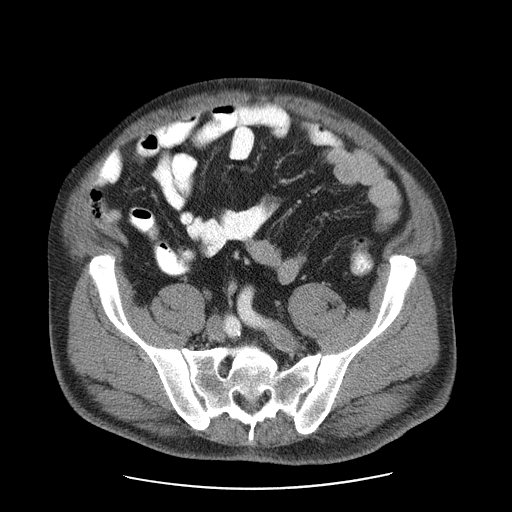
[im 40/99  lung]
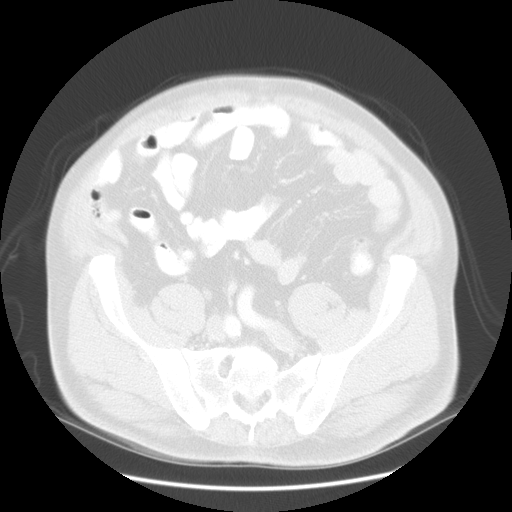
[im 59/99  soft-tissue]
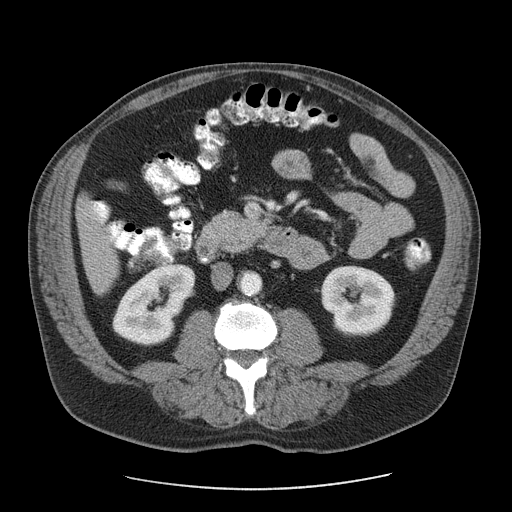
[im 59/99  lung]
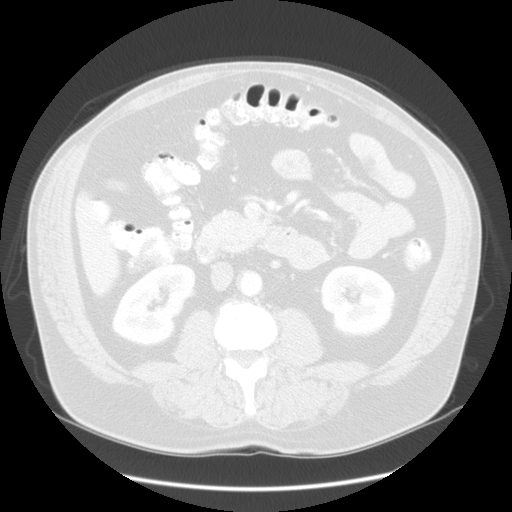
[im 79/99  soft-tissue]
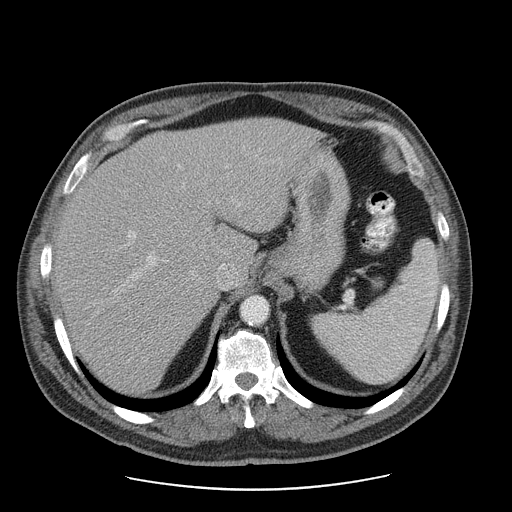
[im 79/99  lung]
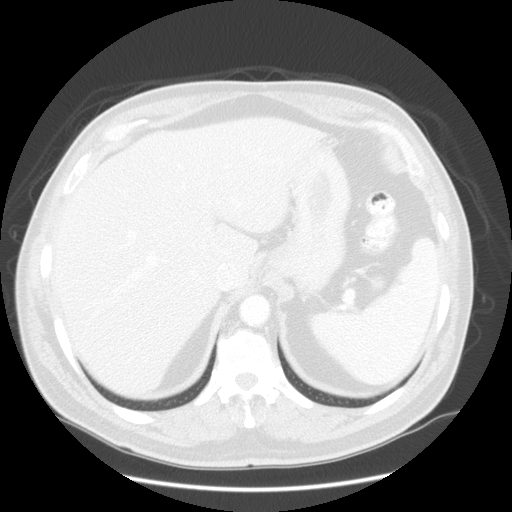

[Series 500: sag · sagittal · 0.98mm/px · 8 of 194 slices shown]
[im 20/194  soft-tissue]
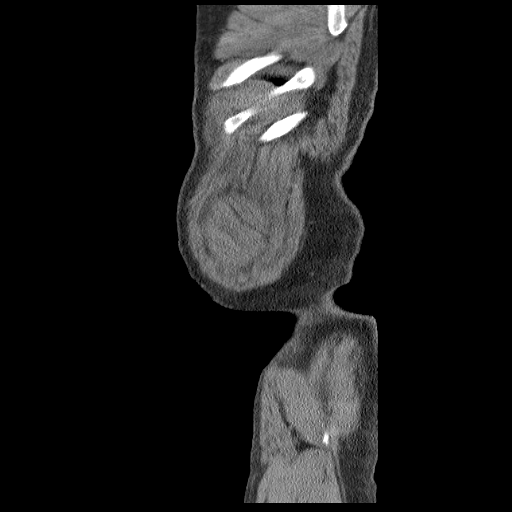
[im 39/194  soft-tissue]
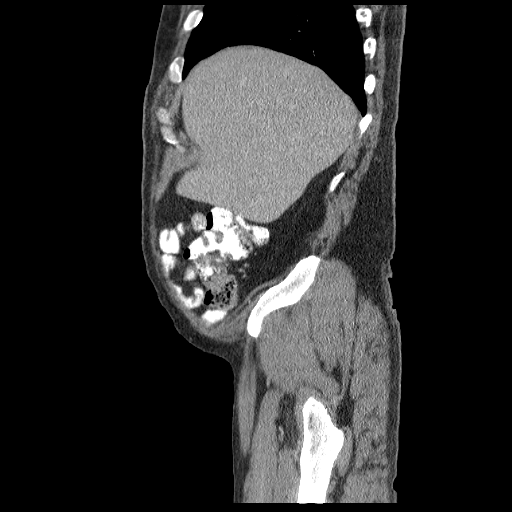
[im 58/194  soft-tissue]
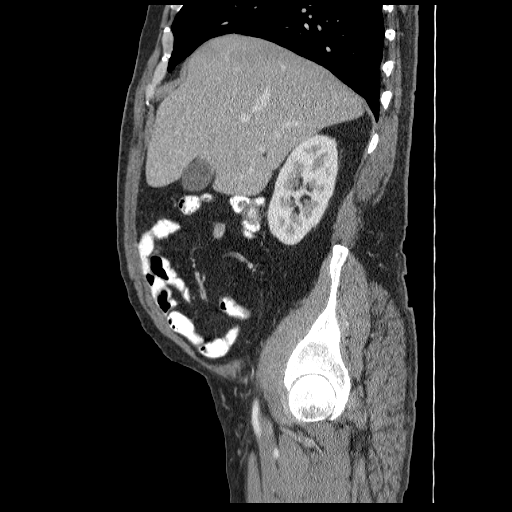
[im 78/194  soft-tissue]
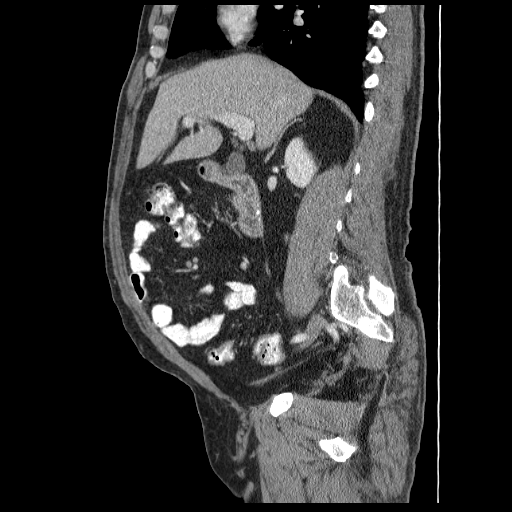
[im 116/194  soft-tissue]
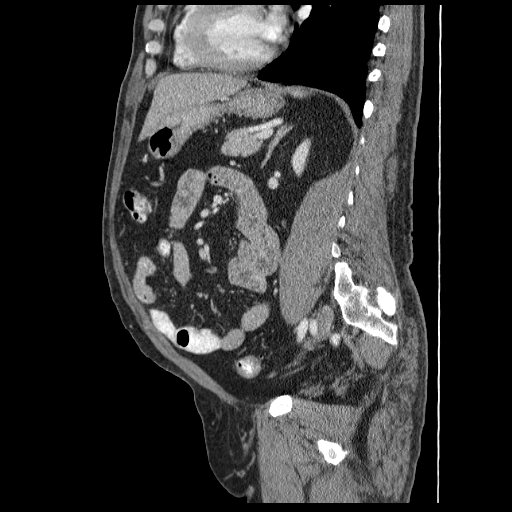
[im 136/194  soft-tissue]
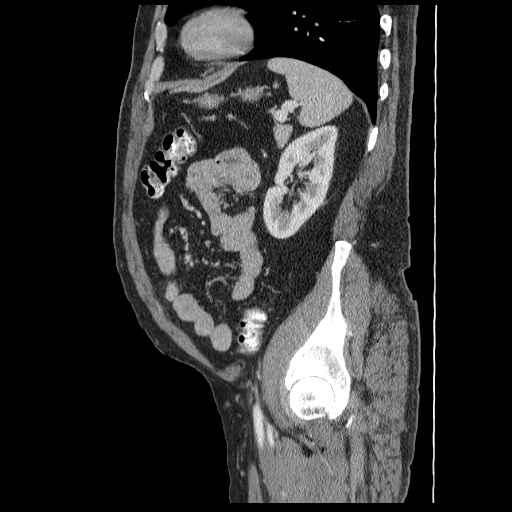
[im 155/194  soft-tissue]
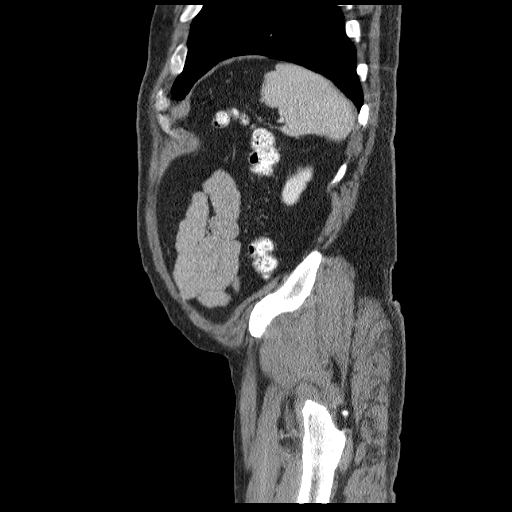
[im 174/194  soft-tissue]
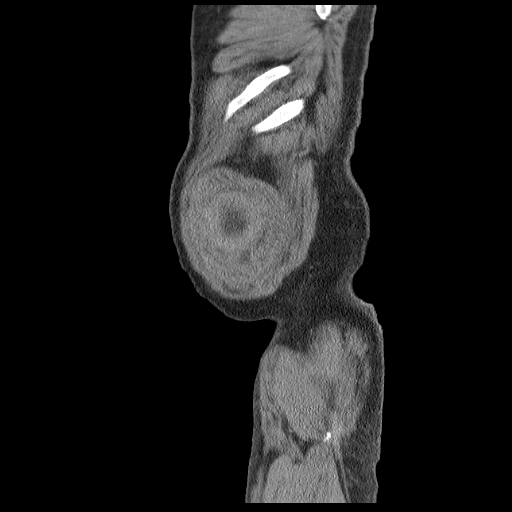

[Series 501: cor · coronal · 0.98mm/px · 2 of 185 slices shown]
[im 19/185  soft-tissue]
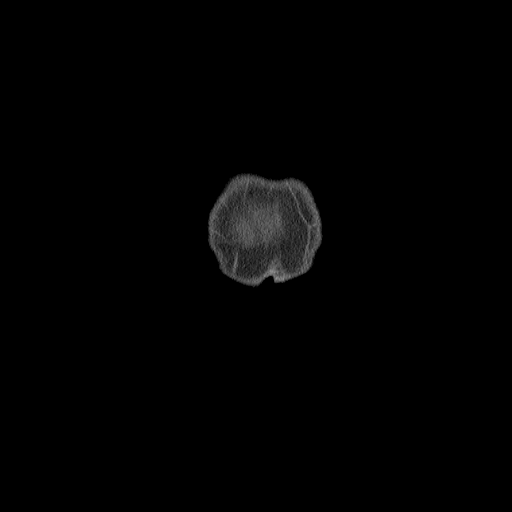
[im 37/185  soft-tissue]
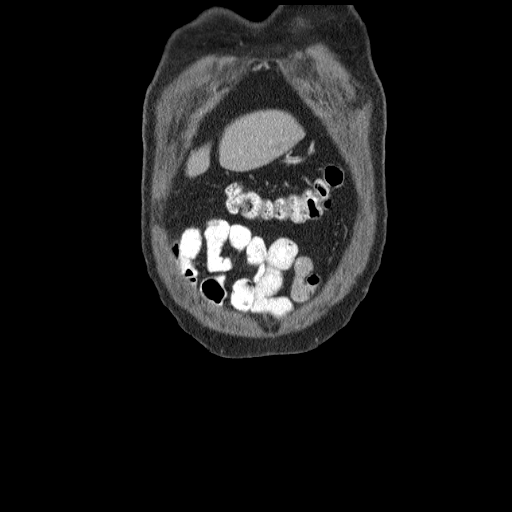

[14 of 32 positions shown; findings below may reference images not displayed]

FINDINGS: Diffuse fatty infiltration of the liver. Spleen normal. Pancreas
normal. No biliary distention. The gallbladder is nondistended.

Adrenals are normal. No focal renal abnormality. No hydronephrosis
or obstructing ureteral stone. The bladder is nondistended.

No significant adenopathy. Aortoiliac atherosclerotic vascular
disease. Visceral vessels are patent. The portal vein is patent.

Appendiceal region is normal. Severe sigmoid colonic diverticulosis.
No evidence of diverticulitis. Stool noted throughout the colon.
There is no evidence of bowel obstruction. The stomach is
nondistended. No free air. No mesenteric mass. Minimal soft tissue
thickening in the region of the umbilicus consistent with prior
surgery. No abscess. No herniation. Tiny inguinal hernias with
herniation of fat only. No bowel herniation .

Lung bases are clear. Heart size normal. Degenerative changes lumbar
spine
IMPRESSION: 1. Diffuse fatty infiltration the liver.
2. Minimal soft tissue thickening the region the. mellitus
consistent with recent surgery. No evidence of abscess. No evidence
of recurrent herniation .

## 2016-07-31 ENCOUNTER — Emergency Department (INDEPENDENT_AMBULATORY_CARE_PROVIDER_SITE_OTHER)
Admission: EM | Admit: 2016-07-31 | Discharge: 2016-07-31 | Disposition: A | Discharge status: Home / Self Care | Payer: BLUE CROSS/BLUE SHIELD | Source: Home / Self Care | Attending: Family Medicine | Admitting: Family Medicine

## 2016-07-31 ENCOUNTER — Encounter: Payer: Self-pay | Admitting: Emergency Medicine

## 2016-07-31 DIAGNOSIS — B9789 Other viral agents as the cause of diseases classified elsewhere: Secondary | ICD-10-CM | POA: Diagnosis not present

## 2016-07-31 DIAGNOSIS — R0981 Nasal congestion: Secondary | ICD-10-CM

## 2016-07-31 DIAGNOSIS — J069 Acute upper respiratory infection, unspecified: Secondary | ICD-10-CM | POA: Diagnosis not present

## 2016-07-31 MED ORDER — ALBUTEROL SULFATE HFA 108 (90 BASE) MCG/ACT IN AERS: 1.0000 | INHALATION_SPRAY | Freq: Four times a day (QID) | RESPIRATORY_TRACT | 0 refills | Status: DC | PRN

## 2016-07-31 MED ORDER — GUAIFENESIN-CODEINE 100-10 MG/5ML PO SYRP: 5.0000 mL | ORAL_SOLUTION | Freq: Three times a day (TID) | ORAL | 0 refills | Status: DC | PRN

## 2016-07-31 MED ORDER — OSELTAMIVIR PHOSPHATE 75 MG PO CAPS: 75.0000 mg | ORAL_CAPSULE | Freq: Two times a day (BID) | ORAL | 0 refills | Status: DC

## 2016-07-31 MED ORDER — IPRATROPIUM BROMIDE 0.06 % NA SOLN: 2.0000 | Freq: Four times a day (QID) | NASAL | 0 refills | Status: DC

## 2016-07-31 MED ORDER — PREDNISONE 20 MG PO TABS: ORAL_TABLET | ORAL | 0 refills | Status: DC

## 2016-07-31 NOTE — ED Triage Notes (Signed)
Patient reports congestion and cough for past 2 days.

## 2016-08-01 NOTE — ED Provider Notes (Signed)
CSN: [id number]     Arrival date & time 07/31/16  1647 History   First MD Initiated Contact with Patient 07/31/16 1712     Chief Complaint  Patient presents with  . Cough  . Nasal Congestion   (Consider location/radiation/quality/duration/timing/severity/associated sxs/prior Treatment) HPI Seth Weeks is a 55 y.o. male presenting to UC with c/o 2 days nasal congestion with moderate to severe dry hacking cough that is occasionally productive.  Cough keeps him up at night.  Associated chest soreness from cough.  He has tried Mucinex with minimal relief. Denies n/v/d. Denies known fever.  No known sick contacts or recent travel.  He did not get the flu vaccine this season.   Past Medical History:  Diagnosis Date  . Diabetes mellitus without complication ([place])   . High triglycerides   . Hypertension    Past Surgical History:  Procedure Laterality Date  . TONSILLECTOMY     Family History  Problem Relation Age of Onset  . Heart failure Mother    Social History  Substance Use Topics  . Smoking status: Never Smoker  . Smokeless tobacco: Never Used  . Alcohol use Yes    Review of Systems  Constitutional: Positive for fatigue. Negative for chills and fever.  HENT: Positive for congestion, postnasal drip and rhinorrhea. Negative for ear pain, sore throat, trouble swallowing and voice change.   Respiratory: Positive for cough. Negative for shortness of breath.   Cardiovascular: Negative for chest pain and palpitations.  Gastrointestinal: Negative for abdominal pain, diarrhea, nausea and vomiting.  Musculoskeletal: Positive for arthralgias and myalgias. Negative for back pain.  Skin: Negative for rash.  Neurological: Negative for dizziness, light-headedness and headaches.    Allergies  Bee venom; Penicillins; and Terramycin [oxytetracycline]  Home Medications   Prior to Admission medications   Medication Sig Start Date End Date Taking? Authorizing Provider  albuterol  (PROVENTIL HFA;VENTOLIN HFA) 108 (90 Base) MCG/ACT inhaler Inhale 1-2 puffs into the lungs every 6 (six) hours as needed for wheezing or shortness of breath. 07/31/16   Noland Fordyce, PA-C  alprazolam Duanne Moron) 2 MG tablet Take 1 tablet (2 mg total) by mouth 3 (three) times daily as needed for anxiety. 06/30/16   Gregor Hams, MD  AMBULATORY NON FORMULARY MEDICATION Gym membership: use to exercise most days out of the week for treatment of hypertension and hyperglycemia. 10/27/15   Sean Hommel, DO  azithromycin (ZITHROMAX) 250 MG tablet Take 1 tablet (250 mg total) by mouth daily. Take first 2 tablets together, then 1 every day until finished. 06/30/16   Gregor Hams, MD  benzonatate (TESSALON) 200 MG capsule Take 1 capsule (200 mg total) by mouth 3 (three) times daily as needed for cough. 06/30/16   Gregor Hams, MD  diphenoxylate-atropine (LOMOTIL) 2.5-0.025 MG tablet Take 1 tablet by mouth 4 (four) times daily as needed for diarrhea or loose stools. 05/03/16   Gregor Hams, MD  FLUoxetine (PROZAC) 20 MG tablet Take 2 tablets (40 mg total) by mouth 2 (two) times daily. 05/05/16   Gregor Hams, MD  guaiFENesin-codeine (ROBITUSSIN AC) 100-10 MG/5ML syrup Take 5 mLs by mouth 3 (three) times daily as needed for cough. 07/31/16   Noland Fordyce, PA-C  ipratropium (ATROVENT) 0.06 % nasal spray Place 2 sprays into both nostrils 4 (four) times daily. 07/31/16   Noland Fordyce, PA-C  lactase (LACTAID) 3000 units tablet Take 1 tablet (3,000 Units total) by mouth 3 (three) times daily as needed. 05/03/16  Gregor Hams, MD  loperamide (IMODIUM) 2 MG capsule TAKE ONE CAPSULE BY MOUTH 4 TIMES DAILY AS NEEDED FOR DIARRHEA OR LOOSE STOOLS. 05/03/16   Gregor Hams, MD  omeprazole (PRILOSEC) 40 MG capsule Take 1 capsule (40 mg total) by mouth daily. 06/30/16   Gregor Hams, MD  oseltamivir (TAMIFLU) 75 MG capsule Take 1 capsule (75 mg total) by mouth every 12 (twelve) hours. 07/31/16   Noland Fordyce, PA-C  predniSONE  (DELTASONE) 20 MG tablet 3 tabs po day one, then 2 po daily x 4 days 07/31/16   Noland Fordyce, PA-C  sitaGLIPtin-metformin (JANUMET) 50-1000 MG tablet Take 1 tablet by mouth 2 (two) times daily with a meal. 06/28/16   Gregor Hams, MD  tadalafil (CIALIS) 20 MG tablet Take 0.5-1 tablets (10-20 mg total) by mouth as needed for erectile dysfunction. 10/31/15   Marcial Pacas, DO   Meds Ordered and Administered this Visit  Medications - No data to display  BP 131/81 (BP Location: Left Arm)   Pulse 101   Temp 98.3 F (36.8 C) (Oral)   Resp 18   Ht 5' 8.5" (1.74 m)   Wt 197 lb (89.4 kg)   SpO2 97%   BMI 29.52 kg/m  No data found.   Physical Exam  Constitutional: He appears well-developed and well-nourished. No distress.  HENT:  Head: Normocephalic and atraumatic.  Right Ear: Tympanic membrane normal.  Left Ear: Tympanic membrane normal.  Nose: Mucosal edema present. Right sinus exhibits no maxillary sinus tenderness and no frontal sinus tenderness. Left sinus exhibits no maxillary sinus tenderness and no frontal sinus tenderness.  Mouth/Throat: Uvula is midline, oropharynx is clear and moist and mucous membranes are normal.  Eyes: Conjunctivae are normal. No scleral icterus.  Neck: Normal range of motion. Neck supple.  Cardiovascular: Normal rate, regular rhythm and normal heart sounds.   Pulmonary/Chest: Effort normal. No respiratory distress. He has wheezes (expiratory wheeze throughout ). He has no rales.  Dry hacking cough during exam. Able to speak in full sentences w/o difficulty between his coughing.  Musculoskeletal: Normal range of motion.  Neurological: He is alert.  Skin: Skin is warm and dry. He is not diaphoretic.  Nursing note and vitals reviewed.   Urgent Care Course   Clinical Course     Procedures (including critical care time)  Labs Review Labs Reviewed - No data to display  Imaging Review No results found.    MDM   1. Viral URI with cough   2. Nasal  congestion    Pt c/o sudden onset moderate to severe cough with congestion.   Wheeze noted on exam. O2 Sat 97% on RA.  Due to sudden onset of symptoms and active flu season, will start pt on Tamiflu and encouraged symptomatic treatment.  Rx: albuterol inhaler, prednisone, ipratropium nasal spray, Tamiflu and guaifenesin-codeine per pt request as he has had before to help with severe cough.  Encouraged f/u with PCP next week for recheck of symptoms if not improving.    Noland Fordyce, PA-C 08/01/16 1104

## 2016-08-03 ENCOUNTER — Encounter: Payer: Self-pay | Admitting: Family Medicine

## 2016-08-03 ENCOUNTER — Ambulatory Visit (INDEPENDENT_AMBULATORY_CARE_PROVIDER_SITE_OTHER): Payer: BLUE CROSS/BLUE SHIELD | Admitting: Family Medicine

## 2016-08-03 VITALS — BP 146/90 | HR 100 | Temp 98.2°F | Ht 69.0 in | Wt 195.0 lb

## 2016-08-03 DIAGNOSIS — J209 Acute bronchitis, unspecified: Secondary | ICD-10-CM | POA: Diagnosis not present

## 2016-08-03 MED ORDER — PROMETHAZINE-DM 6.25-15 MG/5ML PO SYRP: 5.0000 mL | ORAL_SOLUTION | Freq: Every evening | ORAL | 0 refills | Status: DC | PRN

## 2016-08-03 MED ORDER — BENZONATATE 200 MG PO CAPS: 200.0000 mg | ORAL_CAPSULE | Freq: Three times a day (TID) | ORAL | 0 refills | Status: DC | PRN

## 2016-08-03 NOTE — Progress Notes (Signed)
   Subjective:    Patient ID: Seth Weeks, male    DOB: [birth year], 56 y.o.   MRN: [id number]  HPI 58 you male comes in c/o cough x 5 weeks.  He was seen on 11/29 in our office and dx with viral bronchitis.  He was given a perception for azithromycin and Atrovent nasal spray. He continued to not feel well and then went to [facility] on Sat, December 30 and given inhaler, Tamiflu and Robitussin. Says inhaler no longer working. He is wheezing. Patient says that when he went to the pharmacy to pick up his medications the Robitussin cough medication was not in the back. He says he went back to the pharmacy. He thinks he lost it. Says he never took it. We did call the pharmacy to confirm he filled it on 07/31/16.   Review of Systems     Objective:   Physical Exam  Constitutional: He is oriented to person, place, and time. He appears well-developed and well-nourished.  HENT:  Head: Normocephalic and atraumatic.  Right Ear: External ear normal.  Left Ear: External ear normal.  Nose: Nose normal.  Mouth/Throat: Oropharynx is clear and moist.  TMs and canals are clear.   Eyes: Conjunctivae and EOM are normal. Pupils are equal, round, and reactive to light.  Neck: Neck supple. No thyromegaly present.  Cardiovascular: Normal rate and normal heart sounds.   Pulmonary/Chest: Effort normal and breath sounds normal.  Lymphadenopathy:    He has no cervical adenopathy.  Neurological: He is alert and oriented to person, place, and time.  Skin: Skin is warm and dry.  Psychiatric: He has a normal mood and affect.          Assessment & Plan:  Acute bronchitis - Complete the Tamiflu as well as the prednisone. Encouraged hydration and rest. He would like a refill on Tessalon Perles. He is also requesting a refill on the Robitussin before meals but since the pharmacy says that they did fill it even though apparently he has lost and I am not going to give him another narcotic while here today so I'll give him  promethazine DM to use at bedtime. As needed or if not better in one week.

## 2016-08-03 NOTE — Addendum Note (Signed)
Addended by: Teddy Spike on: 08/03/2016 10:39 AM   Modules accepted: Orders

## 2016-08-03 NOTE — Patient Instructions (Addendum)
Make sure to complete the prednisone and the Tamiflu. Can use the Tessalon Perles during the daytime Can use the cough syrup at bedtime If not better in one week or suddenly worse than please call her office for an appointment.

## 2016-08-23 ENCOUNTER — Telehealth: Payer: Self-pay

## 2016-08-23 DIAGNOSIS — F329 Major depressive disorder, single episode, unspecified: Secondary | ICD-10-CM

## 2016-08-23 DIAGNOSIS — F419 Anxiety disorder, unspecified: Principal | ICD-10-CM

## 2016-08-23 DIAGNOSIS — F32A Depression, unspecified: Secondary | ICD-10-CM

## 2016-08-23 MED ORDER — ALPRAZOLAM 2 MG PO TABS: 2.0000 mg | ORAL_TABLET | Freq: Three times a day (TID) | ORAL | 1 refills | Status: DC | PRN

## 2016-08-23 NOTE — Telephone Encounter (Signed)
Seth Weeks has his first appointment to see Seth Handler, MD, PA Psychiatrist in [place], [place] Address: [address] [place], [place] [address] Phone: [phone] on February 5 th. He will need another refill of xanax. Is it ok to refill xanax for 1 month?

## 2016-08-23 NOTE — Telephone Encounter (Signed)
OK to refill

## 2016-08-25 NOTE — Telephone Encounter (Signed)
Left message advising patient the refill of xanax has been sent to pharmacy.

## 2016-08-26 ENCOUNTER — Ambulatory Visit: Payer: BLUE CROSS/BLUE SHIELD | Admitting: Podiatry

## 2016-09-16 ENCOUNTER — Ambulatory Visit: Payer: BLUE CROSS/BLUE SHIELD | Admitting: Podiatry

## 2016-10-04 ENCOUNTER — Encounter: Payer: Self-pay | Admitting: Emergency Medicine

## 2016-10-04 ENCOUNTER — Emergency Department (INDEPENDENT_AMBULATORY_CARE_PROVIDER_SITE_OTHER)
Admission: EM | Admit: 2016-10-04 | Discharge: 2016-10-04 | Disposition: A | Discharge status: Home / Self Care | Payer: BLUE CROSS/BLUE SHIELD | Source: Home / Self Care | Attending: Family Medicine | Admitting: Family Medicine

## 2016-10-04 DIAGNOSIS — J069 Acute upper respiratory infection, unspecified: Secondary | ICD-10-CM | POA: Diagnosis not present

## 2016-10-04 DIAGNOSIS — B9789 Other viral agents as the cause of diseases classified elsewhere: Secondary | ICD-10-CM

## 2016-10-04 DIAGNOSIS — J040 Acute laryngitis: Secondary | ICD-10-CM

## 2016-10-04 LAB — POCT RAPID STREP A (OFFICE): Rapid Strep A Screen: NEGATIVE

## 2016-10-04 MED ORDER — BENZONATATE 100 MG PO CAPS: 100.0000 mg | ORAL_CAPSULE | Freq: Three times a day (TID) | ORAL | 0 refills | Status: DC

## 2016-10-04 MED ORDER — PREDNISONE 20 MG PO TABS: ORAL_TABLET | ORAL | 0 refills | Status: DC

## 2016-10-04 NOTE — ED Provider Notes (Signed)
CSN: [id number]     Arrival date & time 10/04/16  1813 History   First MD Initiated Contact with Patient 10/04/16 1837     Chief Complaint  Patient presents with  . URI   (Consider location/radiation/quality/duration/timing/severity/associated sxs/prior Treatment) HPI  Seth Weeks is a 56 y.o. male presenting to UC with c/o sudden onset chills, sore throat, dry hacking cough and hoarse voice that started last night.  Denies known fever. Denies n/v/d. Pt notes others at work have been sick. Denies hx of asthma. No chest pain or SOB.   Past Medical History:  Diagnosis Date  . Diabetes mellitus without complication ([place])   . High triglycerides   . Hypertension    Past Surgical History:  Procedure Laterality Date  . TONSILLECTOMY     Family History  Problem Relation Age of Onset  . Heart failure Mother    Social History  Substance Use Topics  . Smoking status: Never Smoker  . Smokeless tobacco: Never Used  . Alcohol use Yes    Review of Systems  Constitutional: Positive for chills. Negative for fever.  HENT: Positive for congestion, sore throat and voice change. Negative for ear pain and trouble swallowing.   Respiratory: Positive for cough. Negative for shortness of breath.   Cardiovascular: Negative for chest pain and palpitations.  Gastrointestinal: Negative for abdominal pain, diarrhea, nausea and vomiting.  Musculoskeletal: Negative for arthralgias, back pain and myalgias.  Skin: Negative for rash.    Allergies  Bee venom; Penicillins; and Terramycin [oxytetracycline]  Home Medications   Prior to Admission medications   Medication Sig Start Date End Date Taking? Authorizing Provider  alprazolam Duanne Moron) 2 MG tablet Take 1 tablet (2 mg total) by mouth 3 (three) times daily as needed for anxiety. 08/23/16   Gregor Hams, MD  AMBULATORY NON FORMULARY MEDICATION Gym membership: use to exercise most days out of the week for treatment of hypertension and hyperglycemia.  10/27/15   Sean Hommel, DO  benzonatate (TESSALON) 100 MG capsule Take 1-2 capsules (100-200 mg total) by mouth every 8 (eight) hours. 10/04/16   Noland Fordyce, PA-C  diphenoxylate-atropine (LOMOTIL) 2.5-0.025 MG tablet Take 1 tablet by mouth 4 (four) times daily as needed for diarrhea or loose stools. 05/03/16   Gregor Hams, MD  FLUoxetine (PROZAC) 20 MG tablet Take 2 tablets (40 mg total) by mouth 2 (two) times daily. 05/05/16   Gregor Hams, MD  lactase (LACTAID) 3000 units tablet Take 1 tablet (3,000 Units total) by mouth 3 (three) times daily as needed. 05/03/16   Gregor Hams, MD  omeprazole (PRILOSEC) 40 MG capsule Take 1 capsule (40 mg total) by mouth daily. 06/30/16   Gregor Hams, MD  predniSONE (DELTASONE) 20 MG tablet 3 tabs po day one, then 2 po daily x 4 days 10/04/16   Noland Fordyce, PA-C  sitaGLIPtin-metformin (JANUMET) 50-1000 MG tablet Take 1 tablet by mouth 2 (two) times daily with a meal. 06/28/16   Gregor Hams, MD  tadalafil (CIALIS) 20 MG tablet Take 0.5-1 tablets (10-20 mg total) by mouth as needed for erectile dysfunction. 10/31/15   Marcial Pacas, DO   Meds Ordered and Administered this Visit  Medications - No data to display  BP 132/86 (BP Location: Left Arm)   Pulse 106   Temp 98.1 F (36.7 C) (Oral)   Ht 5\' 8"  (1.727 m)   Wt 189 lb (85.7 kg)   SpO2 94%   BMI 28.74 kg/m  No data  found.   Physical Exam  Constitutional: He is oriented to person, place, and time. He appears well-developed and well-nourished. No distress.  HENT:  Head: Normocephalic and atraumatic.  Right Ear: Tympanic membrane normal.  Left Ear: Tympanic membrane normal.  Nose: Nose normal.  Mouth/Throat: Uvula is midline and mucous membranes are normal. Posterior oropharyngeal erythema present. No oropharyngeal exudate, posterior oropharyngeal edema or tonsillar abscesses.  Eyes: EOM are normal.  Neck: Normal range of motion. Neck supple.  Hoarse voice, no stridor  Cardiovascular: Normal rate and  regular rhythm.   Pulmonary/Chest: Effort normal and breath sounds normal. No stridor. No respiratory distress. He has no wheezes. He has no rales.  Musculoskeletal: Normal range of motion.  Lymphadenopathy:    He has no cervical adenopathy.  Neurological: He is alert and oriented to person, place, and time.  Skin: Skin is warm and dry. He is not diaphoretic.  Psychiatric: He has a normal mood and affect. His behavior is normal.  Nursing note and vitals reviewed.   Urgent Care Course     Procedures (including critical care time)  Labs Review Labs Reviewed  STREP A DNA PROBE  POCT RAPID STREP A (OFFICE)    Imaging Review No results found.    MDM   1. Viral URI with cough   2. Laryngitis    Hx and exam c/w viral illness. Encouraged to treat symptomatically at this time.  F/u with PCP in 1-2 weeks if not improving. Rx: Tessalon and prednisone  Acetaminophen and ibuprofen for pain and/or fever.     Noland Fordyce, PA-C 10/04/16 1952

## 2016-10-04 NOTE — ED Triage Notes (Signed)
Sore throat, cough, body aches, chills, hoarseness, started last night

## 2016-10-05 ENCOUNTER — Telehealth: Payer: Self-pay | Admitting: *Deleted

## 2016-10-05 LAB — STREP A DNA PROBE: GASP: NOT DETECTED

## 2016-10-05 NOTE — Telephone Encounter (Signed)
Call back: LM with Tcx results and to call back if he has any questions or concerns.

## 2016-10-27 ENCOUNTER — Other Ambulatory Visit: Payer: Self-pay | Admitting: Family Medicine

## 2016-11-21 ENCOUNTER — Other Ambulatory Visit: Payer: Self-pay | Admitting: Family Medicine

## 2016-11-21 DIAGNOSIS — F419 Anxiety disorder, unspecified: Principal | ICD-10-CM

## 2016-11-21 DIAGNOSIS — F32A Depression, unspecified: Secondary | ICD-10-CM

## 2016-11-21 DIAGNOSIS — F329 Major depressive disorder, single episode, unspecified: Secondary | ICD-10-CM

## 2016-12-03 ENCOUNTER — Other Ambulatory Visit: Payer: Self-pay | Admitting: Family Medicine

## 2016-12-08 MED ORDER — FLUOXETINE HCL 20 MG PO TABS: 40.0000 mg | ORAL_TABLET | Freq: Two times a day (BID) | ORAL | 3 refills | Status: DC

## 2016-12-13 ENCOUNTER — Encounter: Payer: Self-pay | Admitting: Family Medicine

## 2016-12-21 ENCOUNTER — Telehealth: Payer: Self-pay | Admitting: Family Medicine

## 2016-12-21 MED ORDER — FLUOXETINE HCL 40 MG PO CAPS: 40.0000 mg | ORAL_CAPSULE | Freq: Two times a day (BID) | ORAL | 3 refills | Status: DC

## 2016-12-21 NOTE — Telephone Encounter (Signed)
Prozac tablets are too expensive. Will switch to capsules.   Seth Weeks

## 2016-12-22 ENCOUNTER — Ambulatory Visit (INDEPENDENT_AMBULATORY_CARE_PROVIDER_SITE_OTHER): Payer: BLUE CROSS/BLUE SHIELD | Admitting: Podiatry

## 2016-12-22 DIAGNOSIS — M2040 Other hammer toe(s) (acquired), unspecified foot: Secondary | ICD-10-CM | POA: Diagnosis not present

## 2016-12-22 DIAGNOSIS — M79671 Pain in right foot: Secondary | ICD-10-CM

## 2016-12-22 DIAGNOSIS — M79672 Pain in left foot: Secondary | ICD-10-CM

## 2016-12-22 DIAGNOSIS — L600 Ingrowing nail: Secondary | ICD-10-CM | POA: Diagnosis not present

## 2016-12-22 NOTE — Patient Instructions (Signed)
Seen for painful toes. Ingrown nails debrided on 5th toe bilateral. Padding placed under 4th toe to keep them straight. May benefit from Hammer toe surgery 4 and 5 bilateral. Return as needed.

## 2016-12-22 NOTE — Progress Notes (Signed)
SUBJECTIVE: 57 y.o.year old malepresents complaining of painful toes 4th and 5th bilateral. He has toes wrapped in tape. Wearing custom orthotics. On feet 8 hours at work. Pain on both feet are daily basis.   OBJECTIVE: DERMATOLOGIC EXAMINATION: No abnormal findings.   VASCULAR EXAMINATION OF LOWER LIMBS: All pedal pulses are palpable with normal pulsation.  Capillary Filling times within 3 seconds in all digits.  No edema or erythema noted. Temperature gradient from tibial crest to dorsum of foot is within normal bilateral.  NEUROLOGIC EXAMINATION OF THE LOWER LIMBS: All epicritic and tactile sensations grossly intact.   MUSCULOSKELETAL EXAMINATION: Positive for Tight Achilles tendon bilateral. High arched cavus foot with rearfoot varus, uncompensated. Varus rotated lesser digits 4th and 5th bilateral.  ASSESSMENT: Ankle equinus bilateral. 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 Lesser metatarsalgia from lateral column weight shifting bilateral. Digital contracture , varus rotation 4th and 5th bilateral. Pain in lower limbs.  PLAN: Reviewed clinical findings and available treatment options. All painful nails debrided. Return as needed.

## 2016-12-23 ENCOUNTER — Encounter: Payer: Self-pay | Admitting: Podiatry

## 2017-01-02 ENCOUNTER — Other Ambulatory Visit: Payer: Self-pay | Admitting: Family Medicine

## 2017-01-02 DIAGNOSIS — F419 Anxiety disorder, unspecified: Principal | ICD-10-CM

## 2017-01-02 DIAGNOSIS — F329 Major depressive disorder, single episode, unspecified: Secondary | ICD-10-CM

## 2017-01-02 DIAGNOSIS — F32A Depression, unspecified: Secondary | ICD-10-CM

## 2017-01-03 NOTE — Telephone Encounter (Signed)
This patient had an appointment scheduled with psychiatry feb of this year. Patient is requesting xanax. Do you want to refill this or let psych refill?

## 2017-01-04 NOTE — Telephone Encounter (Signed)
I have provided a 2 week supply.  Pt needs a follow up appointment with me.  Please contact the psych office and see if he ever went. I do not see any office notes scanned into our chart.

## 2017-01-10 ENCOUNTER — Encounter: Payer: Self-pay | Admitting: Family Medicine

## 2017-01-10 ENCOUNTER — Ambulatory Visit (INDEPENDENT_AMBULATORY_CARE_PROVIDER_SITE_OTHER): Payer: BLUE CROSS/BLUE SHIELD | Admitting: Family Medicine

## 2017-01-10 VITALS — BP 132/84 | HR 99 | Wt 195.0 lb

## 2017-01-10 DIAGNOSIS — F329 Major depressive disorder, single episode, unspecified: Secondary | ICD-10-CM | POA: Diagnosis not present

## 2017-01-10 DIAGNOSIS — F419 Anxiety disorder, unspecified: Secondary | ICD-10-CM

## 2017-01-10 DIAGNOSIS — F32A Depression, unspecified: Secondary | ICD-10-CM

## 2017-01-10 DIAGNOSIS — E119 Type 2 diabetes mellitus without complications: Secondary | ICD-10-CM

## 2017-01-10 LAB — POCT GLYCOSYLATED HEMOGLOBIN (HGB A1C): HEMOGLOBIN A1C: 9.7

## 2017-01-10 MED ORDER — LACTASE 3000 UNITS PO TABS: 1.0000 | ORAL_TABLET | Freq: Three times a day (TID) | ORAL | 3 refills | Status: DC | PRN

## 2017-01-10 MED ORDER — DIPHENOXYLATE-ATROPINE 2.5-0.025 MG PO TABS: 1.0000 | ORAL_TABLET | Freq: Four times a day (QID) | ORAL | 3 refills | Status: DC | PRN

## 2017-01-10 MED ORDER — FLUOXETINE HCL 40 MG PO CAPS: 40.0000 mg | ORAL_CAPSULE | Freq: Two times a day (BID) | ORAL | 3 refills | Status: DC

## 2017-01-10 MED ORDER — DAPAGLIFLOZIN PROPANEDIOL 10 MG PO TABS: 10.0000 mg | ORAL_TABLET | Freq: Every day | ORAL | 0 refills | Status: DC

## 2017-01-10 MED ORDER — ALPRAZOLAM 2 MG PO TABS: 2.0000 mg | ORAL_TABLET | Freq: Three times a day (TID) | ORAL | 5 refills | Status: DC | PRN

## 2017-01-10 NOTE — Progress Notes (Signed)
Seth Weeks is a 56 y.o. male who presents to [facility]: [facility] today for follow-up diabetes and irritability.  Diabetes: Patient takes Januvia and metformin for diabetes. He does not check his blood sugars denies any polyuria or polydipsia. His last A1c was October 2016. He was seen last about 6 months ago where labs were ordered. He never got the labs..   Irritability: Patient is a ongoing issue of anxiety and depression presenting as irritability especially at work. He notes this has worsened recently. He continues to take Prozac and Xanax daily. These medications have been ongoing for years and seems to work well. He has never followed up with counseling as directed. He says that he is working towards this. He does think that it would be a good idea. He denies any SI or HI.   Past Medical History:  Diagnosis Date  . Diabetes mellitus without complication ([place])   . High triglycerides   . Hypertension    Past Surgical History:  Procedure Laterality Date  . TONSILLECTOMY     Social History  Substance Use Topics  . Smoking status: Never Smoker  . Smokeless tobacco: Never Used  . Alcohol use Yes   family history includes Heart failure in his mother.  ROS as above:  Medications: Current Outpatient Prescriptions  Medication Sig Dispense Refill  . alprazolam (XANAX) 2 MG tablet Take 1 tablet (2 mg total) by mouth 3 (three) times daily as needed. for anxiety 90 tablet 5  . AMBULATORY NON FORMULARY MEDICATION Gym membership: use to exercise most days out of the week for treatment of hypertension and hyperglycemia. 1 Units 0  . benzonatate (TESSALON) 100 MG capsule Take 1-2 capsules (100-200 mg total) by mouth every 8 (eight) hours. 21 capsule 0  . diphenoxylate-atropine (LOMOTIL) 2.5-0.025 MG tablet Take 1 tablet by mouth 4 (four) times daily as needed for diarrhea or  loose stools. 120 tablet 3  . FLUoxetine (PROZAC) 40 MG capsule Take 1 capsule (40 mg total) by mouth 2 (two) times daily. 180 capsule 3  . lactase (LACTAID) 3000 units tablet Take 1 tablet (3,000 Units total) by mouth 3 (three) times daily as needed. 120 tablet 3  . sitaGLIPtin-metformin (JANUMET) 50-1000 MG tablet Take 1 tablet by mouth 2 (two) times daily with a meal. FINAL REFILL WITHOUT APPOINTMENT. 60 tablet 0  . dapagliflozin propanediol (FARXIGA) 10 MG TABS tablet Take 10 mg by mouth daily. 90 tablet 0   No current facility-administered medications for this visit.    Allergies  Allergen Reactions  . Bee Venom Anaphylaxis  . Penicillins   . Terramycin [Oxytetracycline]     Health Maintenance Health Maintenance  Topic Date Due  . Hepatitis C Screening  [birth year]  . OPHTHALMOLOGY EXAM  11/03/1970  . HIV Screening  11/03/1975  . COLONOSCOPY  11/03/2010  . HEMOGLOBIN A1C  11/06/2015  . INFLUENZA VACCINE  05/03/2017 (Originally 03/02/2017)  . PNEUMOCOCCAL POLYSACCHARIDE VACCINE (1) 05/03/2018 (Originally 11/03/1962)  . FOOT EXAM  05/03/2017  . URINE MICROALBUMIN  05/03/2017  . TETANUS/TDAP  08/02/2021     Exam:  BP 132/84   Pulse 99   Wt 195 lb (88.5 kg)   BMI 29.65 kg/m  Gen: Well NAD HEENT: EOMI,  MMM Lungs: Normal work of breathing. CTABL Heart: RRR no MRG Abd: NABS, Soft. Nondistended, Nontender Exts: Brisk capillary refill, warm and well perfused.  Psych: Alert and oriented normal speech and thought breast. Affect  is irritable. No SI or HI expressed.  Depression screen [facility] 2/9 01/10/2017 06/28/2016 05/03/2016  Decreased Interest 3 1 2   Down, Depressed, Hopeless 3 1 3   PHQ - 2 Score 6 2 5   Altered sleeping 3 3 3   Tired, decreased energy 3 3 3   Change in appetite 3 2 2   Feeling bad or failure about yourself  3 3 3   Trouble concentrating 2 1 2   Moving slowly or fidgety/restless 0 1 2  Suicidal thoughts 3 2 2   PHQ-9 Score 23 17 22   Difficult doing work/chores -  Very difficult Very difficult    GAD 7 : Generalized Anxiety Score 01/10/2017 06/28/2016 05/03/2016  Nervous, Anxious, on Edge 3 3 3   Control/stop worrying 3 3 3   Worry too much - different things 3 3 3   Trouble relaxing 3 3 3   Restless 1 3 2   Easily annoyed or irritable 3 3 3   Afraid - awful might happen 3 3 3   Total GAD 7 Score 19 21 20   Anxiety Difficulty Somewhat difficult Somewhat difficult Very difficult       Results for orders placed or performed in visit on 01/10/17 (from the past 72 hour(s))  POCT HgB A1C     Status: None   Collection Time: 01/10/17  2:25 PM  Result Value Ref Range   Hemoglobin A1C 9.7    No results found.    Assessment and Plan: 56 y.o. male with  Diabetes improved but not well-controlled. Plan to continue Januvia and metformin and add far exceed. Recheck in 3 months.  Irritability: Anxiety and depression. Plan to continue Xanax and Prozac. Strongly encouraged patient to attend counseling. Exertion or if needed.   Orders Placed This Encounter  Procedures  . POCT HgB A1C   Meds ordered this encounter  Medications  . alprazolam (XANAX) 2 MG tablet    Sig: Take 1 tablet (2 mg total) by mouth 3 (three) times daily as needed. for anxiety    Dispense:  90 tablet    Refill:  5    May fill on or after Jun 18th  . FLUoxetine (PROZAC) 40 MG capsule    Sig: Take 1 capsule (40 mg total) by mouth 2 (two) times daily.    Dispense:  180 capsule    Refill:  3  . diphenoxylate-atropine (LOMOTIL) 2.5-0.025 MG tablet    Sig: Take 1 tablet by mouth 4 (four) times daily as needed for diarrhea or loose stools.    Dispense:  120 tablet    Refill:  3  . lactase (LACTAID) 3000 units tablet    Sig: Take 1 tablet (3,000 Units total) by mouth 3 (three) times daily as needed.    Dispense:  120 tablet    Refill:  3  . dapagliflozin propanediol (FARXIGA) 10 MG TABS tablet    Sig: Take 10 mg by mouth daily.    Dispense:  90 tablet    Refill:  0      Discussed warning signs or symptoms. Please see discharge instructions. Patient expresses understanding.  I spent 40 minutes with this patient, greater than 50% was face-to-face time counseling regarding the above diagnosis.

## 2017-01-10 NOTE — Patient Instructions (Addendum)
Thank you for coming in today. I do recommend therapy.  We will recheck in 3 months.   Start farxiga today or tomorrow.  We may have to switch brands based on insurance.   You should hear about therapy soon.  Let me know if you do not hear anything.   Dapagliflozin tablets What is this medicine? DAPAGLIFLOZIN (DAP a gli FLOE zin) helps to treat type 2 diabetes. It helps to control blood sugar. Treatment is combined with diet and exercise. This medicine may be used for other purposes; ask your health care provider or pharmacist if you have questions. COMMON BRAND NAME(S): Wilder Glade What should I tell my health care provider before I take this medicine? They need to know if you have any of these conditions: -bladder cancer -dehydration -diabetic ketoacidosis -diet low in salt -eating less due to illness, surgery, dieting, or any other reason -having surgery -high cholesterol -history of pancreatitis or pancreas problems -history of yeast infection of the penis or vagina -if you often drink alcohol -infections in the bladder, kidneys, or urinary tract -kidney disease -low blood pressure -on hemodialysis -problems urinating -type 1 diabetes -uncircumcised male -an unusual or allergic reaction to dapagliflozin, other medicines, foods, dyes, or preservatives -pregnant or trying to get pregnant -breast-feeding How should I use this medicine? Take this medicine by mouth with a glass of water. Follow the directions on the prescription label. You can take it with or without food. If it upsets your stomach, take it with food. Take this medicine in the morning. Take your dose at the same time each day. Do not take more often than directed. Do not stop taking except on your doctor's advice. A special MedGuide will be given to you by the pharmacist with each prescription and refill. Be sure to read this information carefully each time. Talk to your pediatrician regarding the use of this  medicine in children. Special care may be needed. Overdosage: If you think you have taken too much of this medicine contact a poison control center or emergency room at once. NOTE: This medicine is only for you. Do not share this medicine with others. What if I miss a dose? If you miss a dose, take it as soon as you can. If it is almost time for your next dose, take only that dose. Do not take double or extra doses. What may interact with this medicine? Do not take this medicine with any of the following medications: -gatifloxacin This medicine may also interact with the following medications: -alcohol -certain medicines for blood pressure, heart disease -diuretics -insulin -nateglinide -pioglitazone -quinolone antibiotics like ciprofloxacin, levofloxacin, ofloxacin -repaglinide -some herbal dietary supplements -steroid medicines like prednisone or cortisone -sulfonylureas like glimepiride, glipizide, glyburide -thyroid medicine This list may not describe all possible interactions. Give your health care provider a list of all the medicines, herbs, non-prescription drugs, or dietary supplements you use. Also tell them if you smoke, drink alcohol, or use illegal drugs. Some items may interact with your medicine. What should I watch for while using this medicine? Visit your doctor or health care professional for regular checks on your progress. This medicine can cause a serious condition in which there is too much acid in the blood. If you develop nausea, vomiting, stomach pain, unusual tiredness, or breathing problems, stop taking this medicine and call your doctor right away. If possible, use a ketone dipstick to check for ketones in your urine. A test called the HbA1C (A1C) will be monitored. This is  a simple blood test. It measures your blood sugar control over the last 2 to 3 months. You will receive this test every 3 to 6 months. Learn how to check your blood sugar. Learn the symptoms of  low and high blood sugar and how to manage them. Always carry a quick-source of sugar with you in case you have symptoms of low blood sugar. Examples include hard sugar candy or glucose tablets. Make sure others know that you can choke if you eat or drink when you develop serious symptoms of low blood sugar, such as seizures or unconsciousness. They must get medical help at once. Tell your doctor or health care professional if you have high blood sugar. You might need to change the dose of your medicine. If you are sick or exercising more than usual, you might need to change the dose of your medicine. Do not skip meals. Ask your doctor or health care professional if you should avoid alcohol. Many nonprescription cough and cold products contain sugar or alcohol. These can affect blood sugar. Wear a medical ID bracelet or chain, and carry a card that describes your disease and details of your medicine and dosage times. What side effects may I notice from receiving this medicine? Side effects that you should report to your doctor or health care professional as soon as possible: -allergic reactions like skin rash, itching or hives, swelling of the face, lips, or tongue -breathing problems -dizziness -feeling faint or lightheaded, falls -muscle weakness -nausea, vomiting, unusual stomach upset or pain -signs and symptoms of low blood sugar such as feeling anxious, confusion, dizziness, increased hunger, unusually weak or tired, sweating, shakiness, cold, irritable, headache, blurred vision, fast heartbeat, loss of consciousness -signs and symptoms of a urinary tract infection, such as fever, chills, a burning feeling when urinating, blood in the urine, back pain -trouble passing urine or change in the amount of urine, including an urgent need to urinate more often, in larger amounts, or at night -penile discharge, itching, or pain in men -unusual tiredness -vaginal discharge, itching, or odor in  women Side effects that usually do not require medical attention (report to your doctor or health care professional if they continue or are bothersome): -constipation -mild increase in urination -stuffy or runny nose -sore throat -thirsty This list may not describe all possible side effects. Call your doctor for medical advice about side effects. You may report side effects to FDA at 1-800-FDA-1088. Where should I keep my medicine? Keep out of the reach of children. Store at room temperature between 15 and 30 degrees C (59 and 86 degrees F). Throw away any unused medicine after the expiration date. NOTE: This sheet is a summary. It may not cover all possible information. If you have questions about this medicine, talk to your doctor, pharmacist, or health care provider.  2018 Elsevier/Gold Standard (2015-08-21 08:46:40)

## 2017-01-13 ENCOUNTER — Telehealth: Payer: Self-pay | Admitting: *Deleted

## 2017-01-13 NOTE — Telephone Encounter (Signed)
Called [place] to initiate PA for farxiga since a PA was generated by the pharmacy . The rep states that patient was able to use copay card on file because there was a pain claim for $92. The $92 is the amount paid for a 90 day supply. The patient saved over $1,000

## 2017-03-09 ENCOUNTER — Telehealth: Payer: Self-pay | Admitting: *Deleted

## 2017-03-09 NOTE — Telephone Encounter (Signed)
Called [employer] copay bypass line and gave them information.

## 2017-03-21 ENCOUNTER — Encounter: Payer: Self-pay | Admitting: Family Medicine

## 2017-03-21 ENCOUNTER — Ambulatory Visit (INDEPENDENT_AMBULATORY_CARE_PROVIDER_SITE_OTHER): Payer: BLUE CROSS/BLUE SHIELD | Admitting: Family Medicine

## 2017-03-21 VITALS — BP 126/73 | HR 94 | Wt 189.0 lb

## 2017-03-21 DIAGNOSIS — F329 Major depressive disorder, single episode, unspecified: Secondary | ICD-10-CM | POA: Diagnosis not present

## 2017-03-21 DIAGNOSIS — F32A Depression, unspecified: Secondary | ICD-10-CM

## 2017-03-21 DIAGNOSIS — M7712 Lateral epicondylitis, left elbow: Secondary | ICD-10-CM

## 2017-03-21 DIAGNOSIS — F419 Anxiety disorder, unspecified: Secondary | ICD-10-CM

## 2017-03-21 DIAGNOSIS — M7711 Lateral epicondylitis, right elbow: Secondary | ICD-10-CM

## 2017-03-21 DIAGNOSIS — M7542 Impingement syndrome of left shoulder: Secondary | ICD-10-CM

## 2017-03-21 MED ORDER — OMEPRAZOLE 40 MG PO CPDR: 40.0000 mg | DELAYED_RELEASE_CAPSULE | Freq: Every day | ORAL | 3 refills | Status: DC

## 2017-03-21 MED ORDER — DICLOFENAC SODIUM 75 MG PO TBEC: 75.0000 mg | DELAYED_RELEASE_TABLET | Freq: Two times a day (BID) | ORAL | 0 refills | Status: DC

## 2017-03-21 MED ORDER — NITROGLYCERIN 0.2 MG/HR TD PT24: MEDICATED_PATCH | TRANSDERMAL | 1 refills | Status: DC

## 2017-03-21 NOTE — Patient Instructions (Signed)
Thank you for coming in today. Do the tennis elbow stretch as much as you can .  Do the light resistance with a 1-2 pound hand weight 30 reps 2-3 times daily.  Remember to go down slowly.  We will recheck pain in 1-2 months when we recheck diabetes.  DO not take ibuprofen with Diclofenac.  IT is OK to take tylenol with it.   Nitroglycerin Protocol   Apply 1/4 nitroglycerin patch to affected area daily.  Change position of patch within the affected area every 24 hours.  You may experience a headache during the first 1-2 weeks of using the patch, these should subside.  If you experience headaches after beginning nitroglycerin patch treatment, you may take your preferred over the counter pain reliever.  Another side effect of the nitroglycerin patch is skin irritation or rash related to patch adhesive.  Please notify our office if you develop more severe headaches or rash, and stop the patch.  Tendon healing with nitroglycerin patch may require 12 to 24 weeks depending on the extent of injury.  Men should not use if taking Viagra, Cialis, or Levitra.   Do not use if you have migraines or rosacea.    Tennis Elbow Rehab Ask your health care provider which exercises are safe for you. Do exercises exactly as told by your health care provider and adjust them as directed. It is normal to feel mild stretching, pulling, tightness, or discomfort as you do these exercises, but you should stop right away if you feel sudden pain or your pain gets worse. Do not begin these exercises until told by your health care provider. Stretching and range of motion exercises These exercises warm up your muscles and joints and improve the movement and flexibility of your elbow. These exercises also help to relieve pain, numbness, and tingling. Exercise A: Wrist extensor stretch 1. Extend your left / right elbow with your fingers pointing down. 2. Gently pull the palm of your left / right hand toward you until  you feel a gentle stretch on the top of your forearm. 3. To increase the stretch, push your left / right hand toward the outer edge or pinkie side of your forearm. 4. Hold this position for __________ seconds. Repeat __________ times. Complete this exercise __________ times a day. If directed by your health care provider, repeat this stretch except do it with a bent elbow this time. Exercise B: Wrist flexor stretch  1. Extend your left / right elbow and turn your palm upward. 2. Gently pull your left / right palm and fingertips back so your wrist extends and your fingers point more toward the ground. 3. You should feel a gentle stretch on the inside of your forearm. 4. Hold this position for __________ seconds. Repeat __________ times. Complete this exercise __________ times a day. If directed by your health care provider, repeat this stretch except do it with a bent elbow this time. Strengthening exercises These exercises build strength and endurance in your elbow. Endurance is the ability to use your muscles for a long time, even after they get tired. Exercise C: Wrist extensors  1. Sit with your left / right forearm palm-down and fully supported on a table or countertop. Your elbow should be resting below the height of your shoulder. 2. Let your left / right wrist extend over the edge of the surface. 3. Loosely hold a __________ weight or a piece of rubber exercise band or tubing in your left / right hand.  Slowly curl your left / right hand up toward your forearm. If you are using band or tubing, hold the band or tubing in place with your other hand to provide resistance. 4. Hold this position for __________ seconds. 5. Slowly return to the starting position. Repeat __________ times. Complete this exercise __________ times a day. Exercise D: Radial deviators  1. Stand with a __________ weight in your left / righthand. Or, sit while holding a rubber exercise band or tubing with your other  arm supported on a table or countertop. Position your hand so your thumb is on top. 2. Raise your hand upward in front of you so your thumb travels toward your forearm, or pull up on the rubber tubing. 3. Hold this position for __________ seconds. 4. Slowly return to the starting position. Repeat __________ times. Complete this exercise __________ times a day. Exercise E: Eccentric wrist extensors 1. Sit with your left / right forearm palm-down and fully supported on a table or countertop. Your elbow should be resting below the height of your shoulder. 2. If told by your health care provider, hold a __________ weight in your hand. 3. Let your left / right wrist extend over the edge of the surface. 4. Use your other hand to lift up your left / right hand toward your forearm. Keep your forearm on the table. 5. Using only the muscles in your left / right hand, slowly lower your hand back down to the starting position. Repeat __________ times. Complete this exercise __________ times a day. This information is not intended to replace advice given to you by your health care provider. Make sure you discuss any questions you have with your health care provider. Document Released: 07/19/2005 Document Revised: 03/24/2016 Document Reviewed: 04/17/2015 Elsevier Interactive Patient Education  [employer].

## 2017-03-22 DIAGNOSIS — M7711 Lateral epicondylitis, right elbow: Secondary | ICD-10-CM | POA: Insufficient documentation

## 2017-03-22 DIAGNOSIS — M7712 Lateral epicondylitis, left elbow: Principal | ICD-10-CM

## 2017-03-22 NOTE — Progress Notes (Signed)
Seth Weeks is a 56 y.o. male who presents to [facility]: [place] today for discuss multiple joint pain.  Patient has pain in the bilateral lateral elbows for several months now. 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 He has tried taking ibuprofen at very high doses over-the-counter which has been marginally effective. He denies any radiating pain weakness or numbness.  Additionally patient has pain in the left shoulder. This is worse with overhead motion and with his job as a Personal assistant. He also has tried ibuprofen which helps some.  Irritability: Patient continues to experience significant irritability with his job. He takes the medication regimen below which allows him to function. He is currently in the process of developing programming skills so that he does not have to work as a Personal assistant.   Past Medical History:  Diagnosis Date  . Diabetes mellitus without complication ([place])   . High triglycerides   . Hypertension    Past Surgical History:  Procedure Laterality Date  . TONSILLECTOMY     Social History  Substance Use Topics  . Smoking status: Never Smoker  . Smokeless tobacco: Never Used  . Alcohol use Yes   family history includes Heart failure in his mother.  ROS as above:  Medications: Current Outpatient Prescriptions  Medication Sig Dispense Refill  . alprazolam (XANAX) 2 MG tablet Take 1 tablet (2 mg total) by mouth 3 (three) times daily as needed. for anxiety 90 tablet 5  . AMBULATORY NON FORMULARY MEDICATION Gym membership: use to exercise most days out of the week for treatment of hypertension and hyperglycemia. 1 Units 0  . benzonatate (TESSALON) 100 MG capsule Take 1-2 capsules (100-200 mg total) by mouth every 8 (eight) hours. 21 capsule 0  . dapagliflozin propanediol (FARXIGA) 10 MG TABS tablet  Take 10 mg by mouth daily. 90 tablet 0  . diclofenac (VOLTAREN) 75 MG EC tablet Take 1 tablet (75 mg total) by mouth 2 (two) times daily. 180 tablet 0  . diphenoxylate-atropine (LOMOTIL) 2.5-0.025 MG tablet Take 1 tablet by mouth 4 (four) times daily as needed for diarrhea or loose stools. 120 tablet 3  . FLUoxetine (PROZAC) 40 MG capsule Take 1 capsule (40 mg total) by mouth 2 (two) times daily. 180 capsule 3  . lactase (LACTAID) 3000 units tablet Take 1 tablet (3,000 Units total) by mouth 3 (three) times daily as needed. 120 tablet 3  . nitroGLYCERIN (NITRODUR - DOSED IN MG/24 HR) 0.2 mg/hr patch 1/8 to 1/4 patch to both elbows daily for tendonitis 30 patch 1  . omeprazole (PRILOSEC) 40 MG capsule Take 1 capsule (40 mg total) by mouth daily. 90 capsule 3  . sitaGLIPtin-metformin (JANUMET) 50-1000 MG tablet Take 1 tablet by mouth 2 (two) times daily with a meal. FINAL REFILL WITHOUT APPOINTMENT. 60 tablet 0   No current facility-administered medications for this visit.    Allergies  Allergen Reactions  . Bee Venom Anaphylaxis  . Penicillins   . Terramycin [Oxytetracycline]     Health Maintenance Health Maintenance  Topic Date Due  . Hepatitis C Screening  1961/06/17  . OPHTHALMOLOGY EXAM  11/03/1970  . HIV Screening  11/03/1975  . COLONOSCOPY  11/03/2010  . INFLUENZA VACCINE  05/03/2017 (Originally 03/02/2017)  . PNEUMOCOCCAL POLYSACCHARIDE VACCINE (1) 05/03/2018 (Originally 11/03/1962)  . FOOT EXAM  05/03/2017  . URINE MICROALBUMIN  05/03/2017  . HEMOGLOBIN A1C  07/12/2017  . TETANUS/TDAP  08/02/2021     Exam:  BP 126/73   Pulse 94   Wt 189 lb (85.7 kg)   BMI 28.74 kg/m  Gen: Well NAD HEENT: EOMI,  MMM Lungs: Normal work of breathing. CTABL Heart: RRR no MRG Abd: NABS, Soft. Nondistended, Nontender Exts: Brisk capillary refill, warm and well perfused.  MSK: Elbows bilaterally are normal appearing with no erythema or swelling. Range of motion bilaterally is normal. Tender  to palpation lateral epicondyle bilaterally. Pain is present with resisted wrist extension bilaterally. Pulses capillary refill and sensation are intact throughout upper extremities.  Left shoulder normal-appearing nontender. Pain with abduction. Normal strength. Positive Hawkins and Neer's test.  Psych: Alert and oriented normal speech thought process and affect. No SI or HI expressed.  Depression screen [facility] 2/9 03/21/2017 01/10/2017 06/28/2016 05/03/2016  Decreased Interest 3 3 1 2   Down, Depressed, Hopeless 3 3 1 3   PHQ - 2 Score 6 6 2 5   Altered sleeping 3 3 3 3   Tired, decreased energy 3 3 3 3   Change in appetite 1 3 2 2   Feeling bad or failure about yourself  3 3 3 3   Trouble concentrating 2 2 1 2   Moving slowly or fidgety/restless 2 0 1 2  Suicidal thoughts 3 3 2 2   PHQ-9 Score 23 23 17 22   Difficult doing work/chores - - Very difficult Very difficult      No results found for this or any previous visit (from the past 72 hour(s)). No results found.    Assessment and Plan: 56 y.o. male with  Elbow pain bilaterally is lateral epicondylitis. Plan to treat with nitroglycerin patches and home exercise/physical therapy program.  Shoulder pain is likely rotator cuff tendinopathy. Patient will resume home physical therapy type activities with exercise band and recheck in about a month.  Pain control: Patient is taking too much ibuprofen. We'll try switching to oral diclofenac with omeprazole for GI prophylaxis. He'll recheck in a month. If this is not well controlled will consider referral to pain management because patient has diffuse pain likely due to DJD and his physically demanding job.  Irritability: Due to job demands. Continue current medical regimen and watchful waiting. Recheck in about a month.   No orders of the defined types were placed in this encounter.  Meds ordered this encounter  Medications  . diclofenac (VOLTAREN) 75 MG EC tablet    Sig: Take 1 tablet (75  mg total) by mouth 2 (two) times daily.    Dispense:  180 tablet    Refill:  0  . omeprazole (PRILOSEC) 40 MG capsule    Sig: Take 1 capsule (40 mg total) by mouth daily.    Dispense:  90 capsule    Refill:  3  . nitroGLYCERIN (NITRODUR - DOSED IN MG/24 HR) 0.2 mg/hr patch    Sig: 1/8 to 1/4 patch to both elbows daily for tendonitis    Dispense:  30 patch    Refill:  1     Discussed warning signs or symptoms. Please see discharge instructions. Patient expresses understanding.

## 2017-04-25 ENCOUNTER — Encounter: Payer: Self-pay | Admitting: Family Medicine

## 2017-04-25 ENCOUNTER — Ambulatory Visit (INDEPENDENT_AMBULATORY_CARE_PROVIDER_SITE_OTHER): Payer: BLUE CROSS/BLUE SHIELD | Admitting: Family Medicine

## 2017-04-25 VITALS — BP 159/89 | HR 98 | Wt 189.0 lb

## 2017-04-25 DIAGNOSIS — IMO0002 Reserved for concepts with insufficient information to code with codable children: Secondary | ICD-10-CM

## 2017-04-25 DIAGNOSIS — G894 Chronic pain syndrome: Secondary | ICD-10-CM | POA: Diagnosis not present

## 2017-04-25 DIAGNOSIS — F419 Anxiety disorder, unspecified: Secondary | ICD-10-CM

## 2017-04-25 DIAGNOSIS — E1129 Type 2 diabetes mellitus with other diabetic kidney complication: Secondary | ICD-10-CM | POA: Diagnosis not present

## 2017-04-25 DIAGNOSIS — E119 Type 2 diabetes mellitus without complications: Secondary | ICD-10-CM | POA: Diagnosis not present

## 2017-04-25 DIAGNOSIS — E1165 Type 2 diabetes mellitus with hyperglycemia: Secondary | ICD-10-CM | POA: Diagnosis not present

## 2017-04-25 DIAGNOSIS — F32A Depression, unspecified: Secondary | ICD-10-CM

## 2017-04-25 DIAGNOSIS — Z794 Long term (current) use of insulin: Secondary | ICD-10-CM

## 2017-04-25 DIAGNOSIS — R809 Proteinuria, unspecified: Secondary | ICD-10-CM

## 2017-04-25 DIAGNOSIS — F329 Major depressive disorder, single episode, unspecified: Secondary | ICD-10-CM | POA: Diagnosis not present

## 2017-04-25 LAB — POCT GLYCOSYLATED HEMOGLOBIN (HGB A1C): Hemoglobin A1C: 14

## 2017-04-25 MED ORDER — INSULIN GLARGINE 300 UNIT/ML ~~LOC~~ SOPN: 10.0000 [IU] | PEN_INJECTOR | Freq: Every day | SUBCUTANEOUS | 2 refills | Status: DC

## 2017-04-25 MED ORDER — INSULIN PEN NEEDLE 31G X 5 MM MISC: 12 refills | Status: DC

## 2017-04-25 NOTE — Progress Notes (Signed)
Seth Weeks is a 56 y.o. male who presents to [facility]: [place] today for follow-up of diabetes management, chronic pain, and mood.   Diabetes management: Patient does not regularly check blood sugar at home. Patient reports having difficulty getting enough blood for monitor with finger sticks. He reports adhering to his current diabetes regimen and not having any difficulty taking medications. Patient reports increased urination lately, often 3-9 times per night.  Chronic pain: Patient reports chronic pain in hands, ankles, arms, legs, and shoulders. He states that pain is worse recently as he has begun to perform more physical labor at work. Patient continues to take diclofenac, but states this is not helpful in relieving his pain. He also reports increased fatigue during this time. Patient has not been able to decrease Xanax use.   Mood: Patient has gradually weaned himself and stopped fluoxetine over the past week. Patient reports that he thought he had to be off of this medication before starting opiates. He reports increased irritability during this time, especially when at work.     Patient denies any chest pain, shortness of breath, vision changes, or recent changes in bowel movements.   Past Medical History:  Diagnosis Date  . Diabetes mellitus without complication ([place])   . High triglycerides   . Hypertension    Past Surgical History:  Procedure Laterality Date  . TONSILLECTOMY     Social History  Substance Use Topics  . Smoking status: Never Smoker  . Smokeless tobacco: Never Used  . Alcohol use Yes   family history includes Heart failure in his mother.  ROS as above:  Medications: Current Outpatient Prescriptions  Medication Sig Dispense Refill  . alprazolam (XANAX) 2 MG tablet Take 1 tablet (2 mg total) by mouth 3 (three) times daily as needed. for  anxiety 90 tablet 5  . AMBULATORY NON FORMULARY MEDICATION Gym membership: use to exercise most days out of the week for treatment of hypertension and hyperglycemia. 1 Units 0  . benzonatate (TESSALON) 100 MG capsule Take 1-2 capsules (100-200 mg total) by mouth every 8 (eight) hours. 21 capsule 0  . dapagliflozin propanediol (FARXIGA) 10 MG TABS tablet Take 10 mg by mouth daily. 90 tablet 0  . diclofenac (VOLTAREN) 75 MG EC tablet Take 1 tablet (75 mg total) by mouth 2 (two) times daily. 180 tablet 0  . diphenoxylate-atropine (LOMOTIL) 2.5-0.025 MG tablet Take 1 tablet by mouth 4 (four) times daily as needed for diarrhea or loose stools. 120 tablet 3  . FLUoxetine (PROZAC) 40 MG capsule Take 1 capsule (40 mg total) by mouth 2 (two) times daily. 180 capsule 3  . lactase (LACTAID) 3000 units tablet Take 1 tablet (3,000 Units total) by mouth 3 (three) times daily as needed. 120 tablet 3  . nitroGLYCERIN (NITRODUR - DOSED IN MG/24 HR) 0.2 mg/hr patch 1/8 to 1/4 patch to both elbows daily for tendonitis 30 patch 1  . omeprazole (PRILOSEC) 40 MG capsule Take 1 capsule (40 mg total) by mouth daily. 90 capsule 3  . sitaGLIPtin-metformin (JANUMET) 50-1000 MG tablet Take 1 tablet by mouth 2 (two) times daily with a meal. FINAL REFILL WITHOUT APPOINTMENT. 60 tablet 0  . Insulin Glargine (TOUJEO MAX SOLOSTAR) 300 UNIT/ML SOPN Inject 10 Units into the skin daily. 5 pen 2  . Insulin Pen Needle (ADVOCATE INSULIN PEN NEEDLES) 31G X 5 MM MISC Use with insulin pen daily 100 each 12   No  current facility-administered medications for this visit.    Allergies  Allergen Reactions  . Bee Venom Anaphylaxis  . Penicillins   . Terramycin [Oxytetracycline]     Health Maintenance Health Maintenance  Topic Date Due  . Hepatitis C Screening  11/18/1960  . OPHTHALMOLOGY EXAM  11/03/1970  . HIV Screening  11/03/1975  . COLONOSCOPY  11/03/2010  . INFLUENZA VACCINE  04/25/2018 (Originally 03/02/2017)  . PNEUMOCOCCAL  POLYSACCHARIDE VACCINE (1) 05/03/2018 (Originally 11/03/1962)  . FOOT EXAM  05/03/2017  . URINE MICROALBUMIN  05/03/2017  . HEMOGLOBIN A1C  07/12/2017  . TETANUS/TDAP  08/02/2021     Exam:  BP (!) 159/89   Pulse 98   Wt 189 lb (85.7 kg)   BMI 28.74 kg/m  Gen: Well, sitting in chair, does not appear to be in any acute distress HEENT: EOMI,  MMM Lungs: Normal work of breathing. CTABL Heart: RRR no MRG Abd: NABS, Soft. Nondistended, Nontender Exts: Brisk capillary refill, warm and well perfused.  Psych: Normal tone and speech, appears anxious and agitated, increased irritability particularly when discussing current employment and chronic medications   Results for orders placed or performed in visit on 04/25/17 (from the past 72 hour(s))  POCT HgB A1C     Status: Abnormal   Collection Time: 04/25/17  1:19 PM  Result Value Ref Range   Hemoglobin A1C 14    No results found.    Assessment and Plan: 56 y.o. male presenting for follow-up of diabetes management, chronic pain, and mood. Patient's HbA1c today was 14. He was started on 10 units of insulin glargine daily. Patient was advised to check blood sugar daily and he was given instructions on how to titrate dosage. Patient was advised to return to clinic in the next week or so with his monitor to ensure it is working properly.The process of how to properly use monitor will also be reviewed at this time.   Chronic pain: At this time, conservative therapies have not been helpful and patient requires further interventions and management for his chronic pain. Patient was given a referral to pain management.   Mood: Patient's PHQ-9 today was 19, which is down for 23 in August. He appears to be agitated and anxious which can partially be attributed to his recent discontinuation of fluoxetine. Patient was advised to restart fluoxetine.  Patient will plan to follow-up in clinic in about 1 month or sooner if needed.    Orders Placed This  Encounter  Procedures  . POCT HgB A1C   Meds ordered this encounter  Medications  . Insulin Glargine (TOUJEO MAX SOLOSTAR) 300 UNIT/ML SOPN    Sig: Inject 10 Units into the skin daily.    Dispense:  5 pen    Refill:  2  . Insulin Pen Needle (ADVOCATE INSULIN PEN NEEDLES) 31G X 5 MM MISC    Sig: Use with insulin pen daily    Dispense:  100 each    Refill:  12     Discussed warning signs or symptoms. Please see discharge instructions. Patient expresses understanding.  I spent 40 minutes with this patient, greater than 50% was face-to-face time counseling regarding ddx and treatment plan and how to use insulin pen.

## 2017-04-25 NOTE — Patient Instructions (Addendum)
Thank you for coming in today. Bring the device back for a nurse visit in the next week or so.  Restart Prozac daily.  Give 10 units of insulin daily.  We will adjust the dose based on your sugar.  If you blood sugar is more than 150 in morning before you eat for 3 days in a row increase the dose of insulin by 2   You should hear form pain management soon.  Let me know if you do not hear anything.   Recheck with me in 1 month.   Return sooner if needed.   Insulin Glargine injection What is this medicine? INSULIN GLARGINE (IN su lin GLAR geen) is a human-made form of insulin. This drug lowers the amount of sugar in your blood. It is a long-acting insulin that is usually given once a day. This medicine may be used for other purposes; ask your health care provider or pharmacist if you have questions. COMMON BRAND NAME(S): BASAGLAR, Lantus, Lantus SoloStar, Toujeo SoloStar What should I tell my health care provider before I take this medicine? They need to know if you have any of these conditions: -episodes of low blood sugar -kidney disease -liver disease -an unusual or allergic reaction to insulin, metacresol, other medicines, foods, dyes, or preservatives -pregnant or trying to get pregnant -breast-feeding How should I use this medicine? This medicine is for injection under the skin. Use this medicine at the same time each day. Use exactly as directed. This insulin should never be mixed in the same syringe with other insulins before injection. Do not vigorously shake before use. You will be taught how to use this medicine and how to adjust doses for activities and illness. Do not use more insulin than prescribed. Always check the appearance of your insulin before using it. This medicine should be clear and colorless like water. Do not use it if it is cloudy, thickened, colored, or has solid particles in it. It is important that you put your used needles and syringes in a special sharps  container. Do not put them in a trash can. If you do not have a sharps container, call your pharmacist or healthcare provider to get one. Talk to your pediatrician regarding the use of this medicine in children. Special care may be needed. Overdosage: If you think you have taken too much of this medicine contact a poison control center or emergency room at once. NOTE: This medicine is only for you. Do not share this medicine with others. What if I miss a dose? It is important not to miss a dose. Your health care professional or doctor should discuss a plan for missed doses with you. If you do miss a dose, follow their plan. Do not take double doses. What may interact with this medicine? -other medicines for diabetes Many medications may cause changes in blood sugar, these include: -alcohol containing beverages -antiviral medicines for HIV or AIDS -aspirin and aspirin-like drugs -certain medicines for blood pressure, heart disease, irregular heart beat -chromium -diuretics -male hormones, such as estrogens or progestins, birth control pills -fenofibrate -gemfibrozil -isoniazid -lanreotide -male hormones or anabolic steroids -MAOIs like Carbex, Eldepryl, Marplan, Nardil, and Parnate -medicines for weight loss -medicines for allergies, asthma, cold, or cough -medicines for depression, anxiety, or psychotic disturbances -niacin -nicotine -NSAIDs, medicines for pain and inflammation, like ibuprofen or naproxen -octreotide -pasireotide -pentamidine -phenytoin -probenecid -quinolone antibiotics such as ciprofloxacin, levofloxacin, ofloxacin -some herbal dietary supplements -steroid medicines such as prednisone or cortisone -sulfamethoxazole; trimethoprim -  thyroid hormones Some medications can hide the warning symptoms of low blood sugar (hypoglycemia). You may need to monitor your blood sugar more closely if you are taking one of these medications. These include: -beta-blockers,  often used for high blood pressure or heart problems (examples include atenolol, metoprolol, propranolol) -clonidine -guanethidine -reserpine This list may not describe all possible interactions. Give your health care provider a list of all the medicines, herbs, non-prescription drugs, or dietary supplements you use. Also tell them if you smoke, drink alcohol, or use illegal drugs. Some items may interact with your medicine. What should I watch for while using this medicine? Visit your health care professional or doctor for regular checks on your progress. Do not drive, use machinery, or do anything that needs mental alertness until you know how this medicine affects you. Alcohol may interfere with the effect of this medicine. Avoid alcoholic drinks. A test called the HbA1C (A1C) will be monitored. This is a simple blood test. It measures your blood sugar control over the last 2 to 3 months. You will receive this test every 3 to 6 months. Learn how to check your blood sugar. Learn the symptoms of low and high blood sugar and how to manage them. Always carry a quick-source of sugar with you in case you have symptoms of low blood sugar. Examples include hard sugar candy or glucose tablets. Make sure others know that you can choke if you eat or drink when you develop serious symptoms of low blood sugar, such as seizures or unconsciousness. They must get medical help at once. Tell your doctor or health care professional if you have high blood sugar. You might need to change the dose of your medicine. If you are sick or exercising more than usual, you might need to change the dose of your medicine. Do not skip meals. Ask your doctor or health care professional if you should avoid alcohol. Many nonprescription cough and cold products contain sugar or alcohol. These can affect blood sugar. Make sure that you have the right kind of syringe for the type of insulin you use. Try not to change the brand and type of  insulin or syringe unless your health care professional or doctor tells you to. Switching insulin brand or type can cause dangerously high or low blood sugar. Always keep an extra supply of insulin, syringes, and needles on hand. Use a syringe one time only. Throw away syringe and needle in a closed container to prevent accidental needle sticks. Insulin pens and cartridges should never be shared. Even if the needle is changed, sharing may result in passing of viruses like hepatitis or HIV. Wear a medical ID bracelet or chain, and carry a card that describes your disease and details of your medicine and dosage times. What side effects may I notice from receiving this medicine? Side effects that you should report to your doctor or health care professional as soon as possible: -allergic reactions like skin rash, itching or hives, swelling of the face, lips, or tongue -breathing problems -signs and symptoms of high blood sugar such as dizziness, dry mouth, dry skin, fruity breath, nausea, stomach pain, increased hunger or thirst, increased urination -signs and symptoms of low blood sugar such as feeling anxious, confusion, dizziness, increased hunger, unusually weak or tired, sweating, shakiness, cold, irritable, headache, blurred vision, fast heartbeat, loss of consciousness Side effects that usually do not require medical attention (report to your doctor or health care professional if they continue or are bothersome): -  increase or decrease in fatty tissue under the skin due to overuse of a particular injection site -itching, burning, swelling, or rash at site where injected This list may not describe all possible side effects. Call your doctor for medical advice about side effects. You may report side effects to FDA at 1-800-FDA-1088. Where should I keep my medicine? Keep out of the reach of children. Store unopened vials in a refrigerator between 2 and 8 degrees C (36 and 46 degrees F). Do not freeze  or use if the insulin has been frozen. Opened vials (vials currently in use) may be stored in the refrigerator or at room temperature, at approximately 25 degrees C (77 degrees F) or cooler. Keeping your insulin at room temperature decreases the amount of pain during injection. Once opened, your insulin can be used for 28 days. After 28 days, the vial should be thrown away. Store Lantus [employer], mining in a refrigerator between 2 and 8 degrees C (36 and 46 degrees F) or at room temperature below 30 degrees C (86 degrees F). Do not freeze or use if the insulin has been frozen. Once opened, the pens should be kept at room temperature. Do not store in the refrigerator once opened. Once opened, the insulin can be used for 28 days. After 28 days, the Lantus Solostar Pen or Basaglar KwikPen should be thrown away. Store [employer] in a refrigerator between 2 and 8 degrees C (36 and 46 degrees F). Do not freeze or use if the insulin has been frozen. Once opened, the pens should be kept at room temperature below 30 degrees C (86 degrees F). Do not store in the refrigerator once opened. Once opened, the insulin can be used for 42 days. After 42 days, the [employer] Pen should be thrown away. Protect from light and excessive heat. Throw away any unused medicine after the expiration date or after the specified time for room temperature storage has passed. NOTE: This sheet is a summary. It may not cover all possible information. If you have questions about this medicine, talk to your doctor, pharmacist, or health care provider.  2018 [employer] Standard (2016-08-04 10:26:25)

## 2017-04-26 DIAGNOSIS — G8929 Other chronic pain: Secondary | ICD-10-CM | POA: Insufficient documentation

## 2017-05-16 ENCOUNTER — Ambulatory Visit: Payer: BLUE CROSS/BLUE SHIELD | Admitting: Podiatry

## 2017-05-19 ENCOUNTER — Telehealth: Payer: Self-pay | Admitting: Family Medicine

## 2017-05-19 NOTE — Telephone Encounter (Signed)
Pt called to request Rx for a glucometer. Pt states he wants one where he can prick his arm and get the blood sample from there vs sticking his finger. Routing.

## 2017-05-20 MED ORDER — AMBULATORY NON FORMULARY MEDICATION: 0 refills | Status: AC

## 2017-05-20 NOTE — Telephone Encounter (Signed)
Left VM advising that Rx has been faxed.

## 2017-05-20 NOTE — Telephone Encounter (Signed)
I have written a prescription for which ever glucometer you want to get. You can take it to your pharmacy and ask for which ever device you want.  You should follow up with me soon to keep a close eye on your sugars.  Prescription ready for pick up.

## 2017-06-02 DIAGNOSIS — M79603 Pain in arm, unspecified: Secondary | ICD-10-CM | POA: Diagnosis not present

## 2017-06-02 DIAGNOSIS — Z79899 Other long term (current) drug therapy: Secondary | ICD-10-CM | POA: Diagnosis not present

## 2017-06-02 DIAGNOSIS — G894 Chronic pain syndrome: Secondary | ICD-10-CM | POA: Diagnosis not present

## 2017-06-02 DIAGNOSIS — Z79891 Long term (current) use of opiate analgesic: Secondary | ICD-10-CM | POA: Diagnosis not present

## 2017-06-02 DIAGNOSIS — M542 Cervicalgia: Secondary | ICD-10-CM | POA: Diagnosis not present

## 2017-06-02 DIAGNOSIS — M545 Low back pain: Secondary | ICD-10-CM | POA: Diagnosis not present

## 2017-06-16 ENCOUNTER — Ambulatory Visit: Payer: BLUE CROSS/BLUE SHIELD | Admitting: Family Medicine

## 2017-06-16 ENCOUNTER — Encounter: Payer: Self-pay | Admitting: Family Medicine

## 2017-06-16 VITALS — BP 164/93 | Temp 98.0°F | Ht 68.5 in | Wt 195.0 lb

## 2017-06-16 DIAGNOSIS — G894 Chronic pain syndrome: Secondary | ICD-10-CM

## 2017-06-16 DIAGNOSIS — E1140 Type 2 diabetes mellitus with diabetic neuropathy, unspecified: Secondary | ICD-10-CM

## 2017-06-16 DIAGNOSIS — F419 Anxiety disorder, unspecified: Secondary | ICD-10-CM

## 2017-06-16 DIAGNOSIS — Z794 Long term (current) use of insulin: Secondary | ICD-10-CM

## 2017-06-16 DIAGNOSIS — G629 Polyneuropathy, unspecified: Secondary | ICD-10-CM | POA: Insufficient documentation

## 2017-06-16 DIAGNOSIS — G6289 Other specified polyneuropathies: Secondary | ICD-10-CM

## 2017-06-16 DIAGNOSIS — F329 Major depressive disorder, single episode, unspecified: Secondary | ICD-10-CM

## 2017-06-16 DIAGNOSIS — F32A Depression, unspecified: Secondary | ICD-10-CM

## 2017-06-16 LAB — POCT GLYCOSYLATED HEMOGLOBIN (HGB A1C): Hemoglobin A1C: 12.8

## 2017-06-16 LAB — POCT UA - MICROALBUMIN: Microalbumin Ur, POC: NEGATIVE mg/L

## 2017-06-16 MED ORDER — DAPAGLIFLOZIN PROPANEDIOL 10 MG PO TABS: 10.0000 mg | ORAL_TABLET | Freq: Every day | ORAL | 3 refills | Status: DC

## 2017-06-16 MED ORDER — INSULIN PEN NEEDLE 33G X 4 MM MISC: 1.0000 | Freq: Every day | 12 refills | Status: DC

## 2017-06-16 MED ORDER — GABAPENTIN 300 MG PO CAPS: ORAL_CAPSULE | ORAL | 3 refills | Status: DC

## 2017-06-16 MED ORDER — FLUOXETINE HCL 40 MG PO CAPS: 40.0000 mg | ORAL_CAPSULE | Freq: Two times a day (BID) | ORAL | 3 refills | Status: DC

## 2017-06-16 MED ORDER — SITAGLIPTIN PHOS-METFORMIN HCL 50-1000 MG PO TABS: 1.0000 | ORAL_TABLET | Freq: Two times a day (BID) | ORAL | 3 refills | Status: DC

## 2017-06-16 NOTE — Patient Instructions (Addendum)
Thank you for coming in today. Continue farxiga and insulin.  Restart Janumet Recheck in 3 months.  You should hear about pain management soon.  Let me know if you dont hear anything.  Start gabapentin  Gabapentin capsules or tablets What is this medicine? GABAPENTIN (GA ba pen tin) is used to control partial seizures in adults with epilepsy. It is also used to treat certain types of nerve pain. This medicine may be used for other purposes; ask your health care provider or pharmacist if you have questions. COMMON BRAND NAME(S): Active-PAC with Gabapentin, Gabarone, Neurontin What should I tell my health care provider before I take this medicine? They need to know if you have any of these conditions: -kidney disease -suicidal thoughts, plans, or attempt; a previous suicide attempt by you or a family member -an unusual or allergic reaction to gabapentin, other medicines, foods, dyes, or preservatives -pregnant or trying to get pregnant -breast-feeding How should I use this medicine? Take this medicine by mouth with a glass of water. Follow the directions on the prescription label. You can take it with or without food. If it upsets your stomach, take it with food.Take your medicine at regular intervals. Do not take it more often than directed. Do not stop taking except on your doctor's advice. If you are directed to break the 600 or 800 mg tablets in half as part of your dose, the extra half tablet should be used for the next dose. If you have not used the extra half tablet within 28 days, it should be thrown away. A special MedGuide will be given to you by the pharmacist with each prescription and refill. Be sure to read this information carefully each time. Talk to your pediatrician regarding the use of this medicine in children. Special care may be needed. Overdosage: If you think you have taken too much of this medicine contact a poison control center or emergency room at once. NOTE: This  medicine is only for you. Do not share this medicine with others. What if I miss a dose? If you miss a dose, take it as soon as you can. If it is almost time for your next dose, take only that dose. Do not take double or extra doses. What may interact with this medicine? Do not take this medicine with any of the following medications: -other gabapentin products This medicine may also interact with the following medications: -alcohol -antacids -antihistamines for allergy, cough and cold -certain medicines for anxiety or sleep -certain medicines for depression or psychotic disturbances -homatropine; hydrocodone -naproxen -narcotic medicines (opiates) for pain -phenothiazines like chlorpromazine, mesoridazine, prochlorperazine, thioridazine This list may not describe all possible interactions. Give your health care provider a list of all the medicines, herbs, non-prescription drugs, or dietary supplements you use. Also tell them if you smoke, drink alcohol, or use illegal drugs. Some items may interact with your medicine. What should I watch for while using this medicine? Visit your doctor or health care professional for regular checks on your progress. You may want to keep a record at home of how you feel your condition is responding to treatment. You may want to share this information with your doctor or health care professional at each visit. You should contact your doctor or health care professional if your seizures get worse or if you have any new types of seizures. Do not stop taking this medicine or any of your seizure medicines unless instructed by your doctor or health care professional. Stopping your  medicine suddenly can increase your seizures or their severity. Wear a medical identification bracelet or chain if you are taking this medicine for seizures, and carry a card that lists all your medications. You may get drowsy, dizzy, or have blurred vision. Do not drive, use machinery, or do  anything that needs mental alertness until you know how this medicine affects you. To reduce dizzy or fainting spells, do not sit or stand up quickly, especially if you are an older patient. Alcohol can increase drowsiness and dizziness. Avoid alcoholic drinks. Your mouth may get dry. Chewing sugarless gum or sucking hard candy, and drinking plenty of water will help. The use of this medicine may increase the chance of suicidal thoughts or actions. Pay special attention to how you are responding while on this medicine. Any worsening of mood, or thoughts of suicide or dying should be reported to your health care professional right away. Women who become pregnant while using this medicine may enroll in the [place] Pregnancy Registry by calling [phone]. This registry collects information about the safety of antiepileptic drug use during pregnancy. What side effects may I notice from receiving this medicine? Side effects that you should report to your doctor or health care professional as soon as possible: -allergic reactions like skin rash, itching or hives, swelling of the face, lips, or tongue -worsening of mood, thoughts or actions of suicide or dying Side effects that usually do not require medical attention (report to your doctor or health care professional if they continue or are bothersome): -constipation -difficulty walking or controlling muscle movements -dizziness -nausea -slurred speech -tiredness -tremors -weight gain This list may not describe all possible side effects. Call your doctor for medical advice about side effects. You may report side effects to FDA at 1-800-FDA-1088. Where should I keep my medicine? Keep out of reach of children. This medicine may cause accidental overdose and death if it taken by other adults, children, or pets. Mix any unused medicine with a substance like cat litter or coffee grounds. Then throw the medicine away in a  sealed container like a sealed bag or a coffee can with a lid. Do not use the medicine after the expiration date. Store at room temperature between 15 and 30 degrees C (59 and 86 degrees F). NOTE: This sheet is a summary. It may not cover all possible information. If you have questions about this medicine, talk to your doctor, pharmacist, or health care provider.  2018 Elsevier/Gold Standard (2013-09-14 15:26:50)

## 2017-06-16 NOTE — Progress Notes (Signed)
Seth Weeks is a 56 y.o. male who presents to [facility] [place]: [place] today for follow up chronic pain, diabetes and mood.   Chronic pain.  Seth Weeks has a history of chronic pain. This partially managed by NSAIDS. He notes that the pain is diffuse in neck, elbow and knees BL. He was referred to Dr Vira Blanco for chronic pain management. He had his first visit earlier this month. However Seth Weeks notes that the visit did not go well. He did not feel listened to and would like a referral to a different doctor.   Diabetes:Seth Weeks is taking 25 units of insulin and farxiga for diabetes. He notes that he continues to have some polyuria but denies any hypoglycemic episodes. He feels well otherwise. No fevers or chills. He does not check his blood sugar regularly.  Mood: Seth Weeks notes continued irritability. He continues to take Prozac listed below. He notes this helps his symptoms some.   Past Medical History:  Diagnosis Date  . Diabetes mellitus without complication ([place])   . High triglycerides   . Hypertension    Past Surgical History:  Procedure Laterality Date  . TONSILLECTOMY     Social History   Tobacco Use  . Smoking status: Never Smoker  . Smokeless tobacco: Never Used  Substance Use Topics  . Alcohol use: Yes   family history includes Heart failure in his mother.  ROS as above:  Medications: Current Outpatient Medications  Medication Sig Dispense Refill  . alprazolam (XANAX) 2 MG tablet Take 1 tablet (2 mg total) by mouth 3 (three) times daily as needed. for anxiety 90 tablet 5  . AMBULATORY NON FORMULARY MEDICATION Single glucometer of choice with lancets, test strips. Test 3x daily.  E11.65 Disp QS 1 months 1 each 0  . benzonatate (TESSALON) 100 MG capsule Take 1-2 capsules (100-200 mg total) by mouth every 8 (eight) hours. 21 capsule 0  . dapagliflozin propanediol (FARXIGA) 10 MG TABS  tablet Take 10 mg daily by mouth. 90 tablet 3  . diclofenac (VOLTAREN) 75 MG EC tablet Take 1 tablet (75 mg total) by mouth 2 (two) times daily. 180 tablet 0  . diphenoxylate-atropine (LOMOTIL) 2.5-0.025 MG tablet Take 1 tablet by mouth 4 (four) times daily as needed for diarrhea or loose stools. 120 tablet 3  . FLUoxetine (PROZAC) 40 MG capsule Take 1 capsule (40 mg total) 2 (two) times daily by mouth. 180 capsule 3  . gabapentin (NEURONTIN) 300 MG capsule One tab PO qHS for a week, then BID for a week, then TID. May double weekly to a max of 3,600mg /day 180 capsule 3  . Insulin Glargine (TOUJEO MAX SOLOSTAR) 300 UNIT/ML SOPN Inject 10 Units into the skin daily. 5 pen 2  . Insulin Pen Needle (ADVOCATE INSULIN PEN NEEDLES) 33G X 4 MM MISC 1 each daily by Does not apply route. 100 each 12  . lactase (LACTAID) 3000 units tablet Take 1 tablet (3,000 Units total) by mouth 3 (three) times daily as needed. 120 tablet 3  . nitroGLYCERIN (NITRODUR - DOSED IN MG/24 HR) 0.2 mg/hr patch 1/8 to 1/4 patch to both elbows daily for tendonitis 30 patch 1  . omeprazole (PRILOSEC) 40 MG capsule Take 1 capsule (40 mg total) by mouth daily. 90 capsule 3  . sitaGLIPtin-metformin (JANUMET) 50-1000 MG tablet Take 1 tablet 2 (two) times daily with a meal by mouth. 180 tablet 3   No current facility-administered medications for this visit.  Allergies  Allergen Reactions  . Bee Venom Anaphylaxis  . Penicillins   . Terramycin [Oxytetracycline]     Health Maintenance Health Maintenance  Topic Date Due  . Hepatitis C Screening  1961/05/29  . OPHTHALMOLOGY EXAM  11/03/1970  . HIV Screening  11/03/1975  . COLONOSCOPY  11/03/2010  . FOOT EXAM  05/03/2017  . URINE MICROALBUMIN  05/03/2017  . INFLUENZA VACCINE  04/25/2018 (Originally 03/02/2017)  . PNEUMOCOCCAL POLYSACCHARIDE VACCINE (1) 05/03/2018 (Originally 11/03/1962)  . HEMOGLOBIN A1C  10/23/2017  . TETANUS/TDAP  08/02/2021     Exam:  BP (!) 164/93   Temp 98  F (36.7 C) (Oral)   Ht 5' 8.5" (1.74 m)   Wt 195 lb (88.5 kg)   BMI 29.22 kg/m  Gen: Well NAD HEENT: EOMI,  MMM Lungs: Normal work of breathing. CTABL Heart: RRR no MRG Abd: NABS, Soft. Nondistended, Nontender Exts: Brisk capillary refill, warm and well perfused.  Psych alert and oriented normal speech. Slightly tangential no SI or HI expressed. Foot exam reveals hyperesthesia feet bilaterally.    Results for orders placed or performed in visit on 06/16/17 (from the past 72 hour(s))  POCT UA - Microalbumin     Status: None   Collection Time: 06/16/17  2:30 PM  Result Value Ref Range   Microalbumin Ur, POC NEG mg/L   Creatinine, POC  mg/dL   Albumin/Creatinine Ratio, Urine, POC    POCT HgB A1C     Status: None   Collection Time: 06/16/17  2:31 PM  Result Value Ref Range   Hemoglobin A1C 12.8    No results found.    Assessment and Plan: 56 y.o. male with chronic pain: Difficult to control. I think a repeat referral to a different pain management doctor is reasonable however we spent a lot of time talking about the limited availability of pain management physicians in that he is unlikely to find someone who he is going to get along with perfectly. Patient expresses understanding and agreement and would like a repeat referral placed. He understands that he is unlikely to get a prescription for chronic opiates at the first visit.  Diabetes: A1c is improved. Not well-controlled. Plan to continue insulin and take [place] more frequently. Will restart Janumet. Recheck in 3 months.  Mood: Seth Weeks continues to experience irritability. He is somewhat better controlled with Prozac and Xanax however his symptoms are definitely interfering with his quality of life. Fortunately he is able to keep his irritability in check well enough that he can continue to work and get along with others. However I'm worried that if his symptoms worsen he may lose his job etc. we will continue to  follow.  Peripheral neuropathy: New diagnosis today based on diabetic foot exam. Start treatment with gabapentin as this may be helpful. Recheck in 3 months.   Orders Placed This Encounter  Procedures  . Ambulatory referral to Pain Clinic    Referral Priority:   Routine    Referral Type:   Consultation    Referral Reason:   Specialty Services Required    Requested Specialty:   Pain Medicine    Number of Visits Requested:   1  . POCT UA - Microalbumin  . POCT HgB A1C   Meds ordered this encounter  Medications  . gabapentin (NEURONTIN) 300 MG capsule    Sig: One tab PO qHS for a week, then BID for a week, then TID. May double weekly to a max of 3,600mg /day  Dispense:  180 capsule    Refill:  3  . FLUoxetine (PROZAC) 40 MG capsule    Sig: Take 1 capsule (40 mg total) 2 (two) times daily by mouth.    Dispense:  180 capsule    Refill:  3  . sitaGLIPtin-metformin (JANUMET) 50-1000 MG tablet    Sig: Take 1 tablet 2 (two) times daily with a meal by mouth.    Dispense:  180 tablet    Refill:  3    Please consider 90 day supplies to promote better adherence  . Insulin Pen Needle (ADVOCATE INSULIN PEN NEEDLES) 33G X 4 MM MISC    Sig: 1 each daily by Does not apply route.    Dispense:  100 each    Refill:  12  . DISCONTD: dapagliflozin propanediol (FARXIGA) 10 MG TABS tablet    Sig: Take 10 mg daily by mouth.    Dispense:  90 tablet    Refill:  3  . dapagliflozin propanediol (FARXIGA) 10 MG TABS tablet    Sig: Take 10 mg daily by mouth.    Dispense:  90 tablet    Refill:  3     Discussed warning signs or symptoms. Please see discharge instructions. Patient expresses understanding.

## 2017-06-20 ENCOUNTER — Telehealth: Payer: Self-pay

## 2017-06-20 NOTE — Telephone Encounter (Signed)
Pt called stating that it has become difficult to trim the fourth and fifth toenails on both feet. He would like to know if you would remove the nails on these toes? If so how long would the procedure take and how long recovery would be. Advised pt that info would be routed to provider for review. Please advise.

## 2017-06-21 ENCOUNTER — Telehealth: Payer: Self-pay | Admitting: *Deleted

## 2017-06-21 NOTE — Telephone Encounter (Signed)
Called [employer] to initiate copay bypass for [place]. -[phone].

## 2017-06-21 NOTE — Telephone Encounter (Signed)
Called pt. Per provider procedure can be performed in office. Estimated healing time 2 weeks. Appt scheduled.

## 2017-06-22 ENCOUNTER — Telehealth: Payer: Self-pay

## 2017-06-22 MED ORDER — INSULIN PEN NEEDLE 33G X 4 MM MISC: 1.0000 | Freq: Two times a day (BID) | 12 refills | Status: DC

## 2017-06-22 NOTE — Telephone Encounter (Signed)
Pharmacy requesting for Rx Relion Pen needle 32G X 4MM MIS 100 U to  changed to 2 daily per patient

## 2017-06-22 NOTE — Telephone Encounter (Signed)
Changed.

## 2017-06-25 ENCOUNTER — Other Ambulatory Visit: Payer: Self-pay | Admitting: Family Medicine

## 2017-06-29 ENCOUNTER — Telehealth: Payer: Self-pay | Admitting: *Deleted

## 2017-06-29 NOTE — Telephone Encounter (Signed)
06/29/17 Patient is having surgery next week to remove toenails, he wants to know will the toenails come back and do you use a chemical to help them not come back

## 2017-07-07 ENCOUNTER — Encounter: Payer: Self-pay | Admitting: Podiatry

## 2017-07-07 ENCOUNTER — Ambulatory Visit (INDEPENDENT_AMBULATORY_CARE_PROVIDER_SITE_OTHER): Payer: BLUE CROSS/BLUE SHIELD | Admitting: Podiatry

## 2017-07-07 DIAGNOSIS — L600 Ingrowing nail: Secondary | ICD-10-CM | POA: Diagnosis not present

## 2017-07-07 DIAGNOSIS — M79672 Pain in left foot: Secondary | ICD-10-CM | POA: Diagnosis not present

## 2017-07-07 DIAGNOSIS — M2040 Other hammer toe(s) (acquired), unspecified foot: Secondary | ICD-10-CM | POA: Diagnosis not present

## 2017-07-07 MED ORDER — OXYCODONE-ACETAMINOPHEN 7.5-325 MG PO TABS: 1.0000 | ORAL_TABLET | Freq: Four times a day (QID) | ORAL | 0 refills | Status: DC | PRN

## 2017-07-07 NOTE — Progress Notes (Signed)
Subjective: 56 y.o. year old male patient presents requesting toe nail surgery on left foot 3rd, 4th, and 5th. Toes are turning in while on feet and hurts if nails are getting long. Patient rather to have nails removed.  Objective: Dermatologic: ingrown nails 4th and 5th left. Vascular: Pedal pulses are all palpable. Orthopedic: Contracted lesser digits with varus rotated 4th and 5th digit left. Neurologic: Subjective numbness and throbbing pain both feet.  Assessment: Contracted lesser digits 4th and 5th left. Ingrown nails 4th and 5th left.  Treatment: As per request permanent nail removal done on 4th and 5th left foot. Affected toes were anesthetized with total 31ml mixture of 50/50 0.5% Marcaine plain and 1% Xylocaine plain. Affected nails freed and reflected with a nail elevator and removed with hemostat. Proximal nail matrix tissues were cauterized with Phenol soaked cotton applicator x 4 and neutralized with Alcohol soaked cotton applicator. The 4th and 5th toe nail wound was dressed with Amerigel ointment dressing. Home care instructions and supply dispensed.  Return in 1 week for follow up.  He will take 2 weeks off from work.

## 2017-07-07 NOTE — Patient Instructions (Signed)
Seen for toe nail surgery left foot, 4th and 5th toe permanent nail removal. Follow soaking instruction.  Return in one week or come in sooner if any excess pain or redness is encountered.

## 2017-07-13 ENCOUNTER — Ambulatory Visit (INDEPENDENT_AMBULATORY_CARE_PROVIDER_SITE_OTHER): Payer: BLUE CROSS/BLUE SHIELD | Admitting: Podiatry

## 2017-07-13 ENCOUNTER — Encounter: Payer: Self-pay | Admitting: Podiatry

## 2017-07-13 VITALS — BP 138/87 | HR 86

## 2017-07-13 DIAGNOSIS — L600 Ingrowing nail: Secondary | ICD-10-CM

## 2017-07-13 MED ORDER — OXYCODONE-ACETAMINOPHEN 7.5-325 MG PO TABS: 1.0000 | ORAL_TABLET | Freq: Four times a day (QID) | ORAL | 0 refills | Status: DC | PRN

## 2017-07-13 NOTE — Progress Notes (Signed)
One week post op wound following matrixectomy 4th and 5th toes left foot. Wound is clean and moist at nail bed. Continue with daily soak. Do daily dressing change. May reduce activity for another week. Return as needed.

## 2017-07-16 ENCOUNTER — Other Ambulatory Visit: Payer: Self-pay | Admitting: Family Medicine

## 2017-07-17 DIAGNOSIS — Z880 Allergy status to penicillin: Secondary | ICD-10-CM | POA: Diagnosis not present

## 2017-07-17 DIAGNOSIS — Z79899 Other long term (current) drug therapy: Secondary | ICD-10-CM | POA: Diagnosis not present

## 2017-07-17 DIAGNOSIS — M7989 Other specified soft tissue disorders: Secondary | ICD-10-CM | POA: Diagnosis not present

## 2017-07-17 DIAGNOSIS — Z883 Allergy status to other anti-infective agents status: Secondary | ICD-10-CM | POA: Diagnosis not present

## 2017-07-17 DIAGNOSIS — L03116 Cellulitis of left lower limb: Secondary | ICD-10-CM | POA: Diagnosis not present

## 2017-07-17 DIAGNOSIS — E1140 Type 2 diabetes mellitus with diabetic neuropathy, unspecified: Secondary | ICD-10-CM | POA: Diagnosis not present

## 2017-07-17 DIAGNOSIS — M79672 Pain in left foot: Secondary | ICD-10-CM | POA: Diagnosis not present

## 2017-07-17 DIAGNOSIS — R2242 Localized swelling, mass and lump, left lower limb: Secondary | ICD-10-CM | POA: Diagnosis not present

## 2017-07-17 DIAGNOSIS — Z791 Long term (current) use of non-steroidal anti-inflammatories (NSAID): Secondary | ICD-10-CM | POA: Diagnosis not present

## 2017-07-17 DIAGNOSIS — Z91030 Bee allergy status: Secondary | ICD-10-CM | POA: Diagnosis not present

## 2017-07-17 DIAGNOSIS — Z794 Long term (current) use of insulin: Secondary | ICD-10-CM | POA: Diagnosis not present

## 2017-07-17 DIAGNOSIS — R600 Localized edema: Secondary | ICD-10-CM | POA: Diagnosis not present

## 2017-07-17 DIAGNOSIS — E11628 Type 2 diabetes mellitus with other skin complications: Secondary | ICD-10-CM | POA: Diagnosis not present

## 2017-07-17 DIAGNOSIS — Z87892 Personal history of anaphylaxis: Secondary | ICD-10-CM | POA: Diagnosis not present

## 2017-07-18 ENCOUNTER — Telehealth: Payer: Self-pay | Admitting: *Deleted

## 2017-07-18 NOTE — Telephone Encounter (Signed)
07/18/17 Patient called this afternoon and said her had to go to the ER over the weekend because of foot swelling and hurting. They gave him a antibiotic and a fluid pill. I told him to take the antibiotic and call us back in a day or two if it wasn't feeling better to be seen on our office.

## 2017-07-20 ENCOUNTER — Encounter: Payer: Self-pay | Admitting: Podiatry

## 2017-07-20 ENCOUNTER — Telehealth: Payer: Self-pay | Admitting: *Deleted

## 2017-07-20 NOTE — Telephone Encounter (Signed)
07/20/17 Patient called this afternoon and request a note to return to work on 07/28/17 instead of the original date.

## 2017-07-27 ENCOUNTER — Encounter: Payer: BLUE CROSS/BLUE SHIELD | Admitting: Podiatry

## 2017-07-28 ENCOUNTER — Ambulatory Visit (INDEPENDENT_AMBULATORY_CARE_PROVIDER_SITE_OTHER): Payer: BLUE CROSS/BLUE SHIELD | Admitting: Podiatry

## 2017-07-28 ENCOUNTER — Encounter: Payer: Self-pay | Admitting: Podiatry

## 2017-07-28 DIAGNOSIS — L600 Ingrowing nail: Secondary | ICD-10-CM

## 2017-07-28 MED ORDER — OXYCODONE-ACETAMINOPHEN 7.5-325 MG PO TABS: 1.0000 | ORAL_TABLET | Freq: Four times a day (QID) | ORAL | 0 refills | Status: DC | PRN

## 2017-07-28 NOTE — Patient Instructions (Signed)
3 weeks following left foot toe nail surgery 4th and 5th. Still having pain and swelling.  Wound healing in progress without any indication of infection. Wound, nail bed has no drainage and mostly dry.  Work excuse note dispensed. Resume activity as tolerated. Return as needed.

## 2017-07-28 NOTE — Progress Notes (Signed)
S/P 3 weeks following total matrixectomy 4th and 5th toes left foot, 07/07/17. Stated that he has been consistent with dressing change until last night. Still has open wound and not sure if he can return to work. Stated that he could not sleep last night due pain and did not return to work. Been out of pain medication also. Hurts to walk.  He will take off today and return to work for 3 hours tomorrow and the rest of next week.

## 2017-08-06 ENCOUNTER — Other Ambulatory Visit: Payer: Self-pay | Admitting: Family Medicine

## 2017-08-06 DIAGNOSIS — F329 Major depressive disorder, single episode, unspecified: Secondary | ICD-10-CM

## 2017-08-06 DIAGNOSIS — F32A Depression, unspecified: Secondary | ICD-10-CM

## 2017-08-06 DIAGNOSIS — F419 Anxiety disorder, unspecified: Principal | ICD-10-CM

## 2017-08-09 ENCOUNTER — Telehealth: Payer: Self-pay | Admitting: Podiatry

## 2017-08-09 ENCOUNTER — Encounter: Payer: Self-pay | Admitting: Podiatry

## 2017-08-09 MED ORDER — OXYCODONE-ACETAMINOPHEN 7.5-325 MG PO TABS: 1.0000 | ORAL_TABLET | Freq: Two times a day (BID) | ORAL | 0 refills | Status: DC | PRN

## 2017-08-09 NOTE — Telephone Encounter (Signed)
He may use band aid if helps. Pain medication re ordered. Patient may come to office and pick up the prescription. Thank you

## 2017-08-09 NOTE — Telephone Encounter (Signed)
New Message   *STAT* If patient is at the pharmacy, call can be transferred to refill team.   1. Which medications need to be refilled? (please list name of each medication and dose if known)  oxycodone (Percocet) 7.5-325 mg once every 6 hours as needed  2. Which pharmacy/location (including street and city if local pharmacy) is medication to be sent to? [facility] [place], [place], [place]  3. Do they need a 30 day or 90 day supply? 30 day supply  Pt verbalized he has not placed any bandages on it and wanting to know if there is anything else he needs to do also.

## 2017-08-12 ENCOUNTER — Telehealth: Payer: Self-pay | Admitting: Family Medicine

## 2017-08-12 DIAGNOSIS — G894 Chronic pain syndrome: Secondary | ICD-10-CM | POA: Diagnosis not present

## 2017-08-12 DIAGNOSIS — Z794 Long term (current) use of insulin: Principal | ICD-10-CM

## 2017-08-12 DIAGNOSIS — M47812 Spondylosis without myelopathy or radiculopathy, cervical region: Secondary | ICD-10-CM | POA: Diagnosis not present

## 2017-08-12 DIAGNOSIS — E1165 Type 2 diabetes mellitus with hyperglycemia: Secondary | ICD-10-CM | POA: Diagnosis not present

## 2017-08-12 DIAGNOSIS — Z79899 Other long term (current) drug therapy: Secondary | ICD-10-CM | POA: Diagnosis not present

## 2017-08-12 DIAGNOSIS — E1140 Type 2 diabetes mellitus with diabetic neuropathy, unspecified: Secondary | ICD-10-CM

## 2017-08-12 DIAGNOSIS — Z5181 Encounter for therapeutic drug level monitoring: Secondary | ICD-10-CM | POA: Diagnosis not present

## 2017-08-12 DIAGNOSIS — E1142 Type 2 diabetes mellitus with diabetic polyneuropathy: Secondary | ICD-10-CM | POA: Diagnosis not present

## 2017-08-12 MED ORDER — FREESTYLE LIBRE 14 DAY READER DEVI: 1.0000 [IU] | 0 refills | Status: DC

## 2017-08-12 MED ORDER — FREESTYLE LIBRE 14 DAY SENSOR MISC: 1.0000 [IU] | 3 refills | Status: DC

## 2017-08-12 NOTE — Telephone Encounter (Signed)
Left VM advising Pt.

## 2017-08-12 NOTE — Telephone Encounter (Signed)
Rx sent to pharmacy   

## 2017-08-12 NOTE — Telephone Encounter (Signed)
Pt called clinic stating he got an Rx to get a free Freestyle Libre 14 day reader. This machine uses a sensor that attaches to the skin for 14 days and allows the Pt to check blood sugar at anytime. Pended Rx, unsure is order is correct. Will route for review and approval.

## 2017-08-14 ENCOUNTER — Encounter: Payer: Self-pay | Admitting: Family Medicine

## 2017-08-14 DIAGNOSIS — E1140 Type 2 diabetes mellitus with diabetic neuropathy, unspecified: Secondary | ICD-10-CM

## 2017-08-14 DIAGNOSIS — Z794 Long term (current) use of insulin: Principal | ICD-10-CM

## 2017-08-15 MED ORDER — FREESTYLE LIBRE 14 DAY READER DEVI: 1.0000 [IU] | 0 refills | Status: DC

## 2017-08-23 ENCOUNTER — Encounter: Payer: Self-pay | Admitting: Podiatry

## 2017-08-24 MED ORDER — OXYCODONE-ACETAMINOPHEN 7.5-325 MG PO TABS: 1.0000 | ORAL_TABLET | Freq: Every day | ORAL | 0 refills | Status: DC | PRN

## 2017-08-24 NOTE — Telephone Encounter (Signed)
Rx. Written and reply sent.

## 2017-08-28 ENCOUNTER — Other Ambulatory Visit: Payer: Self-pay | Admitting: Family Medicine

## 2017-08-30 DIAGNOSIS — M47812 Spondylosis without myelopathy or radiculopathy, cervical region: Secondary | ICD-10-CM | POA: Diagnosis not present

## 2017-08-30 DIAGNOSIS — E1142 Type 2 diabetes mellitus with diabetic polyneuropathy: Secondary | ICD-10-CM | POA: Diagnosis not present

## 2017-08-30 DIAGNOSIS — E1165 Type 2 diabetes mellitus with hyperglycemia: Secondary | ICD-10-CM | POA: Diagnosis not present

## 2017-08-30 DIAGNOSIS — G894 Chronic pain syndrome: Secondary | ICD-10-CM | POA: Diagnosis not present

## 2017-09-21 DIAGNOSIS — E1142 Type 2 diabetes mellitus with diabetic polyneuropathy: Secondary | ICD-10-CM | POA: Diagnosis not present

## 2017-09-21 DIAGNOSIS — Z79899 Other long term (current) drug therapy: Secondary | ICD-10-CM | POA: Diagnosis not present

## 2017-09-21 DIAGNOSIS — E1165 Type 2 diabetes mellitus with hyperglycemia: Secondary | ICD-10-CM | POA: Diagnosis not present

## 2017-09-21 DIAGNOSIS — G894 Chronic pain syndrome: Secondary | ICD-10-CM | POA: Diagnosis not present

## 2017-09-21 DIAGNOSIS — Z5181 Encounter for therapeutic drug level monitoring: Secondary | ICD-10-CM | POA: Diagnosis not present

## 2017-09-21 DIAGNOSIS — M47812 Spondylosis without myelopathy or radiculopathy, cervical region: Secondary | ICD-10-CM | POA: Diagnosis not present

## 2017-10-13 ENCOUNTER — Other Ambulatory Visit: Payer: Self-pay | Admitting: Family Medicine

## 2017-10-14 DIAGNOSIS — M47812 Spondylosis without myelopathy or radiculopathy, cervical region: Secondary | ICD-10-CM | POA: Diagnosis not present

## 2017-10-14 DIAGNOSIS — I10 Essential (primary) hypertension: Secondary | ICD-10-CM | POA: Diagnosis not present

## 2017-10-14 DIAGNOSIS — F419 Anxiety disorder, unspecified: Secondary | ICD-10-CM | POA: Diagnosis not present

## 2017-10-14 DIAGNOSIS — E1140 Type 2 diabetes mellitus with diabetic neuropathy, unspecified: Secondary | ICD-10-CM | POA: Diagnosis not present

## 2017-10-14 DIAGNOSIS — Z794 Long term (current) use of insulin: Secondary | ICD-10-CM | POA: Diagnosis not present

## 2017-10-31 DIAGNOSIS — Z5181 Encounter for therapeutic drug level monitoring: Secondary | ICD-10-CM | POA: Diagnosis not present

## 2017-10-31 DIAGNOSIS — G894 Chronic pain syndrome: Secondary | ICD-10-CM | POA: Diagnosis not present

## 2017-10-31 DIAGNOSIS — Z79899 Other long term (current) drug therapy: Secondary | ICD-10-CM | POA: Diagnosis not present

## 2017-11-10 DIAGNOSIS — E1141 Type 2 diabetes mellitus with diabetic mononeuropathy: Secondary | ICD-10-CM | POA: Diagnosis not present

## 2017-11-10 DIAGNOSIS — G894 Chronic pain syndrome: Secondary | ICD-10-CM | POA: Diagnosis not present

## 2017-11-10 DIAGNOSIS — M47812 Spondylosis without myelopathy or radiculopathy, cervical region: Secondary | ICD-10-CM | POA: Diagnosis not present

## 2017-12-08 DIAGNOSIS — M79671 Pain in right foot: Secondary | ICD-10-CM | POA: Diagnosis not present

## 2017-12-08 DIAGNOSIS — E1142 Type 2 diabetes mellitus with diabetic polyneuropathy: Secondary | ICD-10-CM | POA: Diagnosis not present

## 2017-12-08 DIAGNOSIS — M47812 Spondylosis without myelopathy or radiculopathy, cervical region: Secondary | ICD-10-CM | POA: Diagnosis not present

## 2017-12-08 DIAGNOSIS — G894 Chronic pain syndrome: Secondary | ICD-10-CM | POA: Diagnosis not present

## 2018-01-15 ENCOUNTER — Other Ambulatory Visit: Payer: Self-pay | Admitting: Family Medicine

## 2018-02-03 ENCOUNTER — Ambulatory Visit: Payer: Self-pay | Admitting: Sports Medicine

## 2018-02-03 ENCOUNTER — Ambulatory Visit (INDEPENDENT_AMBULATORY_CARE_PROVIDER_SITE_OTHER): Payer: BLUE CROSS/BLUE SHIELD | Admitting: Physician Assistant

## 2018-02-03 ENCOUNTER — Encounter: Payer: Self-pay | Admitting: Physician Assistant

## 2018-02-03 VITALS — BP 135/86 | HR 102 | Ht 68.5 in | Wt 203.0 lb

## 2018-02-03 DIAGNOSIS — G5603 Carpal tunnel syndrome, bilateral upper limbs: Secondary | ICD-10-CM | POA: Diagnosis not present

## 2018-02-03 DIAGNOSIS — J301 Allergic rhinitis due to pollen: Secondary | ICD-10-CM

## 2018-02-03 DIAGNOSIS — M47812 Spondylosis without myelopathy or radiculopathy, cervical region: Secondary | ICD-10-CM | POA: Diagnosis not present

## 2018-02-03 DIAGNOSIS — G894 Chronic pain syndrome: Secondary | ICD-10-CM | POA: Diagnosis not present

## 2018-02-03 DIAGNOSIS — G6289 Other specified polyneuropathies: Secondary | ICD-10-CM | POA: Diagnosis not present

## 2018-02-03 DIAGNOSIS — E1165 Type 2 diabetes mellitus with hyperglycemia: Secondary | ICD-10-CM | POA: Diagnosis not present

## 2018-02-03 DIAGNOSIS — F329 Major depressive disorder, single episode, unspecified: Secondary | ICD-10-CM

## 2018-02-03 DIAGNOSIS — F419 Anxiety disorder, unspecified: Secondary | ICD-10-CM

## 2018-02-03 DIAGNOSIS — E1141 Type 2 diabetes mellitus with diabetic mononeuropathy: Secondary | ICD-10-CM | POA: Diagnosis not present

## 2018-02-03 DIAGNOSIS — R5383 Other fatigue: Secondary | ICD-10-CM

## 2018-02-03 DIAGNOSIS — E1142 Type 2 diabetes mellitus with diabetic polyneuropathy: Secondary | ICD-10-CM | POA: Diagnosis not present

## 2018-02-03 DIAGNOSIS — R200 Anesthesia of skin: Secondary | ICD-10-CM

## 2018-02-03 DIAGNOSIS — F32A Depression, unspecified: Secondary | ICD-10-CM

## 2018-02-03 MED ORDER — MELOXICAM 15 MG PO TABS: 15.0000 mg | ORAL_TABLET | Freq: Every day | ORAL | 2 refills | Status: DC

## 2018-02-03 MED ORDER — L-METHYLFOLATE-B6-B12 3-35-2 MG PO TABS: 1.0000 | ORAL_TABLET | Freq: Every day | ORAL | 5 refills | Status: DC

## 2018-02-03 MED ORDER — GABAPENTIN 300 MG PO CAPS: ORAL_CAPSULE | ORAL | 0 refills | Status: DC

## 2018-02-03 MED ORDER — AMBULATORY NON FORMULARY MEDICATION: 11 refills | Status: DC

## 2018-02-03 MED ORDER — LEVOCETIRIZINE DIHYDROCHLORIDE 5 MG PO TABS: 5.0000 mg | ORAL_TABLET | Freq: Every evening | ORAL | 4 refills | Status: DC

## 2018-02-03 NOTE — Patient Instructions (Signed)
Carpal Tunnel Syndrome Carpal tunnel syndrome is a condition that causes pain in your hand and arm. The carpal tunnel is a narrow area that is on the palm side of your wrist. Repeated wrist motion or certain diseases may cause swelling in the tunnel. This swelling can pinch the main nerve in the wrist (median nerve). Follow these instructions at home: If you have a splint:  Wear it as told by your doctor. Remove it only as told by your doctor.  Loosen the splint if your fingers: ? Become numb and tingle. ? Turn blue and cold.  Keep the splint clean and dry. General instructions  Take over-the-counter and prescription medicines only as told by your doctor.  Rest your wrist from any activity that may be causing your pain. If needed, talk to your employer about changes that can be made in your work, such as getting a wrist pad to use while typing.  If directed, apply ice to the painful area: ? Put ice in a plastic bag. ? Place a towel between your skin and the bag. ? Leave the ice on for 20 minutes, 2-3 times per day.  Keep all follow-up visits as told by your doctor. This is important.  Do any exercises as told by your doctor, physical therapist, or occupational therapist. Contact a doctor if:  You have new symptoms.  Medicine does not help your pain.  Your symptoms get worse. This information is not intended to replace advice given to you by your health care provider. Make sure you discuss any questions you have with your health care provider. Document Released: 07/08/2011 Document Revised: 12/25/2015 Document Reviewed: 12/04/2014 Elsevier Interactive Patient Education  2018 [employer]  

## 2018-02-03 NOTE — Progress Notes (Signed)
Subjective:    Patient ID: Seth Weeks, male    DOB: 07-04-1961, 57 y.o.   MRN: [id number]  HPI  Pt is a 57 yo male with T2DM uncontrolled, polyneuropathy, HTN who presents to the clinic with bilateral hand numbness and tingling. He first noticed symptoms at night and in the morning. Now he is noticing symptoms throughout the day. He has very physical job and having trouble using his hands at his job. He does have DM. He went to see Dr. Ileene Rubens last and A!C in the system was 7.2 10/14/17 Our last a1c is very elevated. He denies any hypoglycemia. His feet neuropathy has worsened. He stopped gabapentin because he thought was making him too drowsy but has started back on it. Overall he just wonders if there is any reason why he is so tired. He wants blood work.   Allergy symptoms. Wants another medication. claritin is not working.  .. Active Ambulatory Problems    Diagnosis Date Noted  . Uncontrolled diabetes mellitus ([place]) 05/25/2013  . Erectile dysfunction 05/25/2013  . BPV (benign positional vertigo) 07/23/2013  . Essential hypertension, benign 10/19/2013  . Umbilical hernia 58/85/0277  . Pes cavus 01/28/2015  . Anxiety and depression 07/17/2015  . Skin lesion 03/03/2016  . Pain in joint, ankle and foot 05/03/2016  . Impingement syndrome of left shoulder 06/28/2016  . Bilateral tennis elbow 03/22/2017  . Chronic pain 04/26/2017  . Peripheral neuropathy 06/16/2017  . Type 2 diabetes mellitus with diabetic neuropathy, unspecified ([place]) 06/16/2017   Resolved Ambulatory Problems    Diagnosis Date Noted  . Paronychia 07/06/2013   Past Medical History:  Diagnosis Date  . Diabetes mellitus without complication ([place])   . High triglycerides   . Hypertension      Review of Systems    see HPI Objective:   Physical Exam  Constitutional: He is oriented to person, place, and time. He appears well-developed and well-nourished.  HENT:  Head: Normocephalic and atraumatic.  Right Ear:  External ear normal.  Left Ear: External ear normal.  Eyes:  Lazy right eye.   Neck: Normal range of motion.  Cardiovascular: Normal rate and regular rhythm.  Pulmonary/Chest: Effort normal and breath sounds normal.  Musculoskeletal:  Positive phalens bilaterally.  Negative tinels.  Strength 5/5 with hand grip.   Neurological: He is alert and oriented to person, place, and time.  Psychiatric: He has a normal mood and affect. His behavior is normal.          Assessment & Plan:  .Marland KitchenOdies was seen today for hand pain.  Diagnoses and all orders for this visit:  Bilateral hand numbness -     meloxicam (MOBIC) 15 MG tablet; Take 1 tablet (15 mg total) by mouth daily. -     CBC -     Comprehensive metabolic panel -     C-reactive protein -     Ferritin -     Sedimentation rate -     TSH -     Vitamin B12 -     Vit D  25 hydroxy (rtn osteoporosis monitoring)  No energy -     CBC -     Comprehensive metabolic panel -     C-reactive protein -     Ferritin -     Sedimentation rate -     TSH -     Vitamin B12 -     Vit D  25 hydroxy (rtn osteoporosis monitoring)  Uncontrolled type 2 diabetes  mellitus with hyperglycemia (HCC)  Other polyneuropathy -     gabapentin (NEURONTIN) 300 MG capsule; One tab PO qHS for a week, then BID for a week, then TID. May double weekly to a max of 3,600mg /day -     l-methylfolate-B6-B12 (METANX) 3-35-2 MG TABS tablet; Take 1 tablet by mouth daily.  Anxiety and depression  Allergic rhinitis due to pollen, unspecified seasonality -     levocetirizine (XYZAL) 5 MG tablet; Take 1 tablet (5 mg total) by mouth every evening.  Other orders -     AMBULATORY NON FORMULARY MEDICATION; One year of exercise at a gym for his overall health and medical conditions/strength/endurance training.  ..    ;Labs ordered to evaluate low energy.   Suspect carpal tunnel. Will try wrist splints. Pt placed in donjoy splints today.  NSAIDs make sure to take with  meal/watch for GI symptoms. ICE. Consider hydrodissection procedure. Consider EMGs for confirmation. HO given.   Refilled gabapentin for polyneuropathy. Consider metanax for neuropathy.  Continue to follow up with PCP concerning DM.   [name].Spent 30 minutes with patient and greater than 50 percent of visit spent counseling patient regarding treatment plan.

## 2018-02-07 ENCOUNTER — Encounter: Payer: Self-pay | Admitting: Physician Assistant

## 2018-03-17 ENCOUNTER — Encounter: Payer: Self-pay | Admitting: Family Medicine

## 2018-03-17 ENCOUNTER — Ambulatory Visit (INDEPENDENT_AMBULATORY_CARE_PROVIDER_SITE_OTHER): Payer: BLUE CROSS/BLUE SHIELD | Admitting: Family Medicine

## 2018-03-17 VITALS — BP 138/92 | HR 102 | Wt 207.0 lb

## 2018-03-17 DIAGNOSIS — I10 Essential (primary) hypertension: Secondary | ICD-10-CM

## 2018-03-17 DIAGNOSIS — F419 Anxiety disorder, unspecified: Secondary | ICD-10-CM

## 2018-03-17 DIAGNOSIS — F332 Major depressive disorder, recurrent severe without psychotic features: Secondary | ICD-10-CM

## 2018-03-17 DIAGNOSIS — E1165 Type 2 diabetes mellitus with hyperglycemia: Secondary | ICD-10-CM | POA: Diagnosis not present

## 2018-03-17 DIAGNOSIS — Z794 Long term (current) use of insulin: Secondary | ICD-10-CM

## 2018-03-17 DIAGNOSIS — R5383 Other fatigue: Secondary | ICD-10-CM | POA: Diagnosis not present

## 2018-03-17 DIAGNOSIS — E1140 Type 2 diabetes mellitus with diabetic neuropathy, unspecified: Secondary | ICD-10-CM

## 2018-03-17 DIAGNOSIS — M7740 Metatarsalgia, unspecified foot: Secondary | ICD-10-CM | POA: Insufficient documentation

## 2018-03-17 DIAGNOSIS — G5603 Carpal tunnel syndrome, bilateral upper limbs: Secondary | ICD-10-CM

## 2018-03-17 DIAGNOSIS — F32A Depression, unspecified: Secondary | ICD-10-CM

## 2018-03-17 DIAGNOSIS — F329 Major depressive disorder, single episode, unspecified: Secondary | ICD-10-CM

## 2018-03-17 DIAGNOSIS — G6289 Other specified polyneuropathies: Secondary | ICD-10-CM

## 2018-03-17 DIAGNOSIS — M7741 Metatarsalgia, right foot: Secondary | ICD-10-CM

## 2018-03-17 DIAGNOSIS — G5600 Carpal tunnel syndrome, unspecified upper limb: Secondary | ICD-10-CM | POA: Insufficient documentation

## 2018-03-17 HISTORY — DX: Major depressive disorder, single episode, unspecified: F32.9

## 2018-03-17 LAB — POCT GLYCOSYLATED HEMOGLOBIN (HGB A1C): HEMOGLOBIN A1C: 5.9 % — AB (ref 4.0–5.6)

## 2018-03-17 MED ORDER — BUPROPION HCL ER (XL) 150 MG PO TB24: 150.0000 mg | ORAL_TABLET | ORAL | 1 refills | Status: DC

## 2018-03-17 MED ORDER — ALPRAZOLAM 2 MG PO TABS: 2.0000 mg | ORAL_TABLET | Freq: Three times a day (TID) | ORAL | 1 refills | Status: DC | PRN

## 2018-03-17 NOTE — Patient Instructions (Addendum)
Thank you for coming in today. Start wellbutrin.  Recheck in 1 month.  Return sooner if needed.  You should her about sleep study . Let me know if you do not.  Get labs today.     Bupropion extended-release tablets (Depression/Mood Disorders) What is this medicine? BUPROPION (byoo PROE pee on) is used to treat depression. This medicine may be used for other purposes; ask your health care provider or pharmacist if you have questions. COMMON BRAND NAME(S): Aplenzin, Budeprion XL, Forfivo XL, Wellbutrin XL What should I tell my health care provider before I take this medicine? They need to know if you have any of these conditions: -an eating disorder, such as anorexia or bulimia -bipolar disorder or psychosis -diabetes or high blood sugar, treated with medication -glaucoma -head injury or brain tumor -heart disease, previous heart attack, or irregular heart beat -high blood pressure -kidney or liver disease -seizures (convulsions) -suicidal thoughts or a previous suicide attempt -Tourette's syndrome -weight loss -an unusual or allergic reaction to bupropion, other medicines, foods, dyes, or preservatives -breast-feeding -pregnant or trying to become pregnant How should I use this medicine? Take this medicine by mouth with a glass of water. Follow the directions on the prescription label. You can take it with or without food. If it upsets your stomach, take it with food. Do not crush, chew, or cut these tablets. This medicine is taken once daily at the same time each day. Do not take your medicine more often than directed. Do not stop taking this medicine suddenly except upon the advice of your doctor. Stopping this medicine too quickly may cause serious side effects or your condition may worsen. A special MedGuide will be given to you by the pharmacist with each prescription and refill. Be sure to read this information carefully each time. Talk to your pediatrician regarding the use of  this medicine in children. Special care may be needed. Overdosage: If you think you have taken too much of this medicine contact a poison control center or emergency room at once. NOTE: This medicine is only for you. Do not share this medicine with others. What if I miss a dose? If you miss a dose, skip the missed dose and take your next tablet at the regular time. Do not take double or extra doses. What may interact with this medicine? Do not take this medicine with any of the following medications: -linezolid -MAOIs like Azilect, Carbex, Eldepryl, Marplan, Nardil, and Parnate -methylene blue (injected into a vein) -other medicines that contain bupropion like Zyban This medicine may also interact with the following medications: -alcohol -certain medicines for anxiety or sleep -certain medicines for blood pressure like metoprolol, propranolol -certain medicines for depression or psychotic disturbances -certain medicines for HIV or AIDS like efavirenz, lopinavir, nelfinavir, ritonavir -certain medicines for irregular heart beat like propafenone, flecainide -certain medicines for Parkinson's disease like amantadine, levodopa -certain medicines for seizures like carbamazepine, phenytoin, phenobarbital -cimetidine -clopidogrel -cyclophosphamide -digoxin -furazolidone -isoniazid -nicotine -orphenadrine -procarbazine -steroid medicines like prednisone or cortisone -stimulant medicines for attention disorders, weight loss, or to stay awake -tamoxifen -theophylline -thiotepa -ticlopidine -tramadol -warfarin This list may not describe all possible interactions. Give your health care provider a list of all the medicines, herbs, non-prescription drugs, or dietary supplements you use. Also tell them if you smoke, drink alcohol, or use illegal drugs. Some items may interact with your medicine. What should I watch for while using this medicine? Tell your doctor if your symptoms do not get  better or if they get worse. Visit your doctor or health care professional for regular checks on your progress. Because it may take several weeks to see the full effects of this medicine, it is important to continue your treatment as prescribed by your doctor. Patients and their families should watch out for new or worsening thoughts of suicide or depression. Also watch out for sudden changes in feelings such as feeling anxious, agitated, panicky, irritable, hostile, aggressive, impulsive, severely restless, overly excited and hyperactive, or not being able to sleep. If this happens, especially at the beginning of treatment or after a change in dose, call your health care professional. Avoid alcoholic drinks while taking this medicine. Drinking large amounts of alcoholic beverages, using sleeping or anxiety medicines, or quickly stopping the use of these agents while taking this medicine may increase your risk for a seizure. Do not drive or use heavy machinery until you know how this medicine affects you. This medicine can impair your ability to perform these tasks. Do not take this medicine close to bedtime. It may prevent you from sleeping. Your mouth may get dry. Chewing sugarless gum or sucking hard candy, and drinking plenty of water may help. Contact your doctor if the problem does not go away or is severe. The tablet shell for some brands of this medicine does not dissolve. This is normal. The tablet shell may appear whole in the stool. This is not a cause for concern. What side effects may I notice from receiving this medicine? Side effects that you should report to your doctor or health care professional as soon as possible: -allergic reactions like skin rash, itching or hives, swelling of the face, lips, or tongue -breathing problems -changes in vision -confusion -elevated mood, decreased need for sleep, racing thoughts, impulsive behavior -fast or irregular heartbeat -hallucinations, loss  of contact with reality -increased blood pressure -redness, blistering, peeling or loosening of the skin, including inside the mouth -seizures -suicidal thoughts or other mood changes -unusually weak or tired -vomiting Side effects that usually do not require medical attention (report to your doctor or health care professional if they continue or are bothersome): -constipation -headache -loss of appetite -nausea -tremors -weight loss This list may not describe all possible side effects. Call your doctor for medical advice about side effects. You may report side effects to FDA at 1-800-FDA-1088. Where should I keep my medicine? Keep out of the reach of children. Store at room temperature between 15 and 30 degrees C (59 and 86 degrees F). Throw away any unused medicine after the expiration date. NOTE: This sheet is a summary. It may not cover all possible information. If you have questions about this medicine, talk to your doctor, pharmacist, or health care provider.  2018 Elsevier/Gold Standard (2016-01-09 13:55:13)

## 2018-03-17 NOTE — Progress Notes (Signed)
Seth Weeks is a 57 y.o. male who presents to [facility] today for follow up depression, anxiety, diabetes and discuss fatigue and foot pain.   Seth Weeks notes significant fatigue worsening recently. He notes that he is sleeping from about 2am until noon most days. He continues to have significant day time fatigue and will sometimes fall asleep during the day. He is not sure about snoring or stopping breathing at night. He denies any recently medication or health changes.   Seth Weeks also notes that his mood symptoms have been worse recently.  He has a long history of significant difficult to control anxiety.  Previously he was taking Prozac 40 mg daily and Xanax.  He previously was taking 2 mg of Xanax 3 times daily.  His Prozac was discontinued at some point of the last few months when he felt it was not very effective.  He notes since then his depression symptoms have worsened.  He notes difficulty with energy and motivation.  He denies any SI or HI.  He notes continued profound job stress as well.  He notes in the past Paxil has not been effective for mood symptoms.  Diabetes: Seth "[name]" is doing well with his diabetes.  He takes medications listed below denies any polyuria or polydipsia.  He does not check his blood sugar regularly.  Foot pain:Seth "[name]" no notes pain in his right plantar foot.  He has pain underlying the second and third MTPs with a callus.  He is been shaved the callus down but notes the bottom of his foot is sensitive to touch.  He does that he has a little bit of diabetic peripheral neuropathy that makes his foot more sensitive but this is different than his previous nerve pain in his foot.  Pain is worse with activity better with rest.  Symptoms ongoing now for a few weeks now without injury.  Aside from shaving his calyces not tried much treatment yet.  Carpal  tunnel syndrome: Seth "[name]" was seen by my partner about 6 weeks ago and thought to have bilateral carpal tunnel syndrome.  He was given cock-up wrist splints which have been very helpful.  He notes his symptoms of numbness and tingling into his first 3 digits bilaterally have diminished but not completely.  He is a very hand heavy job at work and notes that his pain numbness and tingling into his hands have been slowly worsening over the last several months without injury.   ROS as above:  Exam:  BP (!) 138/92   Pulse (!) 102   Wt 207 lb (93.9 kg)   BMI 31.01 kg/m  Gen: Well NAD HEENT: EOMI,  MMM Lungs: Normal work of breathing. CTABL Heart: RRR no MRG Abd: NABS, Soft. Nondistended, Nontender Exts: Brisk capillary refill, warm and well perfused.  Psych alert and oriented normal speech and thought process.  Affect is somewhat hyperactive.  No SI or HI. MSK: Hands bilaterally with no thenar atrophy.  Positive Tinel's and Phalen's test.  Normal grip strength. Right foot: Callus formation underlying second and third metatarsal heads at the plantar aspect of the foot.  Tender to touch.  Normal foot motion.  Pulses and capillary refill are intact.  Hyperesthesia present plantar right foot.  Depression screen Seth Weeks 2/9 03/17/2018 04/25/2017 03/21/2017 01/10/2017 06/28/2016  Decreased Interest 3 3 3 3 1   Down, Depressed, Hopeless 3 2 3 3 1   PHQ - 2 Score 6  5 6 6 2   Altered sleeping 3 3 3 3 3   Tired, decreased energy 3 3 3 3 3   Change in appetite 3 2 1 3 2   Feeling bad or failure about yourself  3 3 3 3 3   Trouble concentrating 3 1 2 2 1   Moving slowly or fidgety/restless 3 1 2  0 1  Suicidal thoughts 3 1 3 3 2   PHQ-9 Score 27 19 23 23 17   Difficult doing work/chores Extremely dIfficult - - - Very difficult   GAD 7 : Generalized Anxiety Score 03/17/2018 01/10/2017 06/28/2016 05/03/2016  Nervous, Anxious, on Edge 3 3 3 3   Control/stop worrying 3 3 3 3   Worry too much - different things 3 3 3 3     Trouble relaxing 3 3 3 3   Restless 3 1 3 2   Easily annoyed or irritable 3 3 3 3   Afraid - awful might happen 3 3 3 3   Total GAD 7 Score 21 19 21 20   Anxiety Difficulty Extremely difficult Somewhat difficult Somewhat difficult Very difficult      Lab and Radiology Results Results for orders placed or performed in visit on 03/17/18 (from the past 72 hour(s))  POCT HgB A1C     Status: Abnormal   Collection Time: 03/17/18 11:19 AM  Result Value Ref Range   Hemoglobin A1C 5.9 (A) 4.0 - 5.6 %   HbA1c POC (<> result, manual entry)     HbA1c, POC (prediabetic range)     HbA1c, POC (controlled diabetic range)     No results found.    Assessment and Plan: 57 y.o. male with  Fatigue.  Etiology unclear however I am suspicious for sleep apnea.  Will order home sleep study as Epworth sleep scale is positive.  Additionally will obtain basic metabolic work-up listed below.  Recheck in 1 month.  Depression: Patient has severe major depressive symptoms due to major depressive disorder.  Work stress is also a cofactor here.  He previously was better controlled but not sufficiently controlled on Prozac.  As he did not experience much benefit from it we will switch to bupropion.  Will start 150 XL daily anticipate increase to 300 mg daily.  Recheck in 1 month.  Suicide precautions reviewed verbally with patient.  Anxiety:Seth "[name]" has had significant difficulty with anxiety and job stress previously.  Plan to continue Xanax and other coping mechanisms previously discussed.  Caution regarding higher doses of Xanax with opiates.  Opiates prescribed by pain management.  Diabetes: Well-controlled continue current regimen.  Foot pain: Metatarsalgia likely complicated by diabetic peripheral neuropathy.  Discussed metatarsal foot pads.  Recheck in 1 month.  Carpal tunnel syndrome: New issue for me today.  Discussed treatment options.  Patient has benefit but not resolution of symptoms with cock up wrist  splints.  Discussed injections.  Patient would like to hold off on that and recheck in 1 month.  I spent 40 minutes with this patient, greater than 50% was face-to-face time counseling regarding ddx and plan.  Orders Placed This Encounter  Procedures  . CBC  . COMPLETE METABOLIC PANEL WITH GFR  . TSH  . POCT HgB A1C  . Home sleep test    Standing Status:   Future    Standing Expiration Date:   03/18/2019    Order Specific Question:   Where should this test be performed:    Answer:   Other   Meds ordered this encounter  Medications  . buPROPion (WELLBUTRIN XL) 150  MG 24 hr tablet    Sig: Take 1 tablet (150 mg total) by mouth every morning.    Dispense:  30 tablet    Refill:  1  . ALPRAZolam (XANAX) 2 MG tablet    Sig: Take 1 tablet (2 mg total) by mouth 3 (three) times daily as needed for anxiety.    Dispense:  90 tablet    Refill:  1     Historical information moved to improve visibility of documentation.  Past Medical History:  Diagnosis Date  . Diabetes mellitus without complication ([place])   . GAD (generalized anxiety disorder) 07/17/2015  . High triglycerides   . Hypertension   . MDD (major depressive disorder) 03/17/2018   Past Surgical History:  Procedure Laterality Date  . TONSILLECTOMY     Social History   Tobacco Use  . Smoking status: Never Smoker  . Smokeless tobacco: Never Used  Substance Use Topics  . Alcohol use: Yes   family history includes Heart failure in his mother.  Medications: Current Outpatient Medications  Medication Sig Dispense Refill  . ALPRAZolam (XANAX) 2 MG tablet Take 1 tablet (2 mg total) by mouth 3 (three) times daily as needed for anxiety. 90 tablet 1  . AMBULATORY NON FORMULARY MEDICATION Single glucometer of choice with lancets, test strips. Test 3x daily.  E11.65 Disp QS 1 months 1 each 0  . AMBULATORY NON FORMULARY MEDICATION One year of exercise at a gym for his overall health and medical conditions/strength/endurance  training. 100 Device 11  . Continuous Blood Gluc Receiver (FREESTYLE LIBRE 14 DAY READER) DEVI 1 Units by Does not apply route every 14 (fourteen) days. Use to check blood sugar daily, pairs with Freestyle Libra sensors. Please include sensors with device. Change sensor every 14 days. Dx: E11.40. 1 Device 0  . Continuous Blood Gluc Sensor (FREESTYLE LIBRE 14 DAY SENSOR) MISC 1 Units by Does not apply route every 14 (fourteen) days. Use to check blood sugar daily, change sensor every 14 days. Dx: E11.40. 2 each 3  . dapagliflozin propanediol (FARXIGA) 10 MG TABS tablet Take 10 mg daily by mouth. 90 tablet 3  . diphenoxylate-atropine (LOMOTIL) 2.5-0.025 MG tablet TAKE 1 TABLET BY MOUTH 4 TIMES DAILY AS NEEDED FOR DIARRHEA OR  LOOSE  STOOLS 120 tablet 3  . gabapentin (NEURONTIN) 300 MG capsule One tab PO qHS for a week, then BID for a week, then TID. May double weekly to a max of 3,600mg /day 180 capsule 0  . Gabapentin, Once-Daily, (GRALISE) 600 MG TABS Take by mouth.    . Insulin Pen Needle (ADVOCATE INSULIN PEN NEEDLES) 33G X 4 MM MISC 1 each by Does not apply route 2 (two) times daily. 100 each 12  . l-methylfolate-B6-B12 (METANX) 3-35-2 MG TABS tablet Take 1 tablet by mouth daily. 30 tablet 5  . lactase (LACTAID) 3000 units tablet Take 1 tablet (3,000 Units total) by mouth 3 (three) times daily as needed. 120 tablet 3  . levocetirizine (XYZAL) 5 MG tablet Take 1 tablet (5 mg total) by mouth every evening. 90 tablet 4  . meloxicam (MOBIC) 15 MG tablet Take 1 tablet (15 mg total) by mouth daily. 90 tablet 2  . nitroGLYCERIN (NITRODUR - DOSED IN MG/24 HR) 0.2 mg/hr patch 1/8 to 1/4 patch to both elbows daily for tendonitis 30 patch 1  . omeprazole (PRILOSEC) 40 MG capsule Take 1 capsule (40 mg total) by mouth daily. 90 capsule 3  . oxyCODONE-acetaminophen (PERCOCET) 7.5-325 MG tablet Take 1  tablet by mouth daily as needed. 30 tablet 0  . sitaGLIPtin-metformin (JANUMET) 50-1000 MG tablet Take 1 tablet 2  (two) times daily with a meal by mouth. 180 tablet 3  . TOUJEO MAX SOLOSTAR 300 UNIT/ML SOPN INJECT 10 UNITS SUBCUTANEOUSLY ONCE DAILY 6 pen 2  . buPROPion (WELLBUTRIN XL) 150 MG 24 hr tablet Take 1 tablet (150 mg total) by mouth every morning. 30 tablet 1   No current facility-administered medications for this visit.    Allergies  Allergen Reactions  . Bee Venom Anaphylaxis  . Penicillins   . Terramycin [Oxytetracycline]      Discussed warning signs or symptoms. Please see discharge instructions. Patient expresses understanding.

## 2018-03-18 LAB — CBC
HCT: 46.1 % (ref 38.5–50.0)
HEMOGLOBIN: 14.4 g/dL (ref 13.2–17.1)
MCH: 25 pg — AB (ref 27.0–33.0)
MCHC: 31.2 g/dL — AB (ref 32.0–36.0)
MCV: 80.2 fL (ref 80.0–100.0)
MPV: 11.1 fL (ref 7.5–12.5)
Platelets: 198 10*3/uL (ref 140–400)
RBC: 5.75 10*6/uL (ref 4.20–5.80)
RDW: 14.8 % (ref 11.0–15.0)
WBC: 9.7 10*3/uL (ref 3.8–10.8)

## 2018-03-18 LAB — COMPLETE METABOLIC PANEL WITH GFR
AG Ratio: 1.5 (calc) (ref 1.0–2.5)
ALBUMIN MSPROF: 3.6 g/dL (ref 3.6–5.1)
ALKALINE PHOSPHATASE (APISO): 59 U/L (ref 40–115)
ALT: 73 U/L — ABNORMAL HIGH (ref 9–46)
AST: 45 U/L — ABNORMAL HIGH (ref 10–35)
BILIRUBIN TOTAL: 0.7 mg/dL (ref 0.2–1.2)
BUN: 20 mg/dL (ref 7–25)
CHLORIDE: 103 mmol/L (ref 98–110)
CO2: 27 mmol/L (ref 20–32)
Calcium: 8.4 mg/dL — ABNORMAL LOW (ref 8.6–10.3)
Creat: 1.14 mg/dL (ref 0.70–1.33)
GFR, Est African American: 82 mL/min/{1.73_m2} (ref >=60)
GFR, Est Non African American: 71 mL/min/{1.73_m2} (ref >=60)
Globulin: 2.4 g/dL (calc) (ref 1.9–3.7)
Glucose, Bld: 152 mg/dL — ABNORMAL HIGH (ref 65–99)
Potassium: 4.4 mmol/L (ref 3.5–5.3)
SODIUM: 137 mmol/L (ref 135–146)
Total Protein: 6 g/dL — ABNORMAL LOW (ref 6.1–8.1)

## 2018-03-18 LAB — TSH: TSH: 1.01 m[IU]/L (ref 0.40–4.50)

## 2018-03-23 ENCOUNTER — Telehealth: Payer: Self-pay

## 2018-03-23 NOTE — Telephone Encounter (Signed)
Error. Rhonda Cunningham,CMA  

## 2018-03-27 ENCOUNTER — Ambulatory Visit: Payer: Self-pay | Admitting: Family Medicine

## 2018-03-28 ENCOUNTER — Ambulatory Visit (INDEPENDENT_AMBULATORY_CARE_PROVIDER_SITE_OTHER): Payer: BLUE CROSS/BLUE SHIELD | Admitting: Family Medicine

## 2018-03-28 ENCOUNTER — Encounter: Payer: Self-pay | Admitting: Family Medicine

## 2018-03-28 VITALS — BP 146/101 | HR 105 | Ht 68.5 in | Wt 205.0 lb

## 2018-03-28 DIAGNOSIS — E1140 Type 2 diabetes mellitus with diabetic neuropathy, unspecified: Secondary | ICD-10-CM | POA: Diagnosis not present

## 2018-03-28 DIAGNOSIS — Z794 Long term (current) use of insulin: Secondary | ICD-10-CM

## 2018-03-28 DIAGNOSIS — R5383 Other fatigue: Secondary | ICD-10-CM | POA: Diagnosis not present

## 2018-03-28 DIAGNOSIS — G5603 Carpal tunnel syndrome, bilateral upper limbs: Secondary | ICD-10-CM

## 2018-03-28 DIAGNOSIS — E162 Hypoglycemia, unspecified: Secondary | ICD-10-CM | POA: Diagnosis not present

## 2018-03-28 MED ORDER — FREESTYLE LIBRE 14 DAY SENSOR MISC: 1.0000 [IU] | 3 refills | Status: DC

## 2018-03-28 MED ORDER — FREESTYLE LIBRE 14 DAY READER DEVI: 1.0000 [IU] | 0 refills | Status: DC

## 2018-03-28 MED ORDER — INSULIN GLARGINE 300 UNIT/ML ~~LOC~~ SOPN: 40.0000 [IU] | PEN_INJECTOR | Freq: Every day | SUBCUTANEOUS | 3 refills | Status: DC

## 2018-03-28 NOTE — Progress Notes (Signed)
Seth Weeks is a 57 y.o. male who presents to [facility] today for hypoglycemia.  JD episode of hypoglycemia at work.  He became confused and his blood sugar was in the 30s.  He notes that he has been taking 60 units of Toujeo insulin and not checking his blood sugars.  This is more than the 10 units I thought he was taking at the last visit.  He notes he has been taking 60 units of insulin for some time now.  In the past he had a freestyle libre meter but he lost it.  In the interim he has got a new diabetes meter and has been checking his blood sugars.  Typically they are in the normal range from 100-150.  He had one episode of blood sugar at 55 last night.  He felt jittery and recognized his low blood sugar episode.  He feels well otherwise with no fevers or chills nausea vomiting or diarrhea currently.  He notes bilateral carpal tunnel syndrome that he was seen for a few weeks ago.  He would like to proceed with injections in the near future.  He like his blood sugars to stabilize before proceeding with steroid injections.  Additionally the last visit patient had significant fatigue and was thought to have sleep apnea.  Home sleep study ordered.  Patient notes that he thinks he will have a better result at a lab sleep study.  ROS as above:  Exam:  BP (!) 146/101   Pulse (!) 105   Ht 5' 8.5" (1.74 m)   Wt 205 lb (93 kg)   BMI 30.72 kg/m  Wt Readings from Last 5 Encounters:  03/28/18 205 lb (93 kg)  03/17/18 207 lb (93.9 kg)  02/03/18 203 lb (92.1 kg)  06/16/17 195 lb (88.5 kg)  04/25/17 189 lb (85.7 kg)    Gen: Well NAD HEENT: EOMI,  MMM Lungs: Normal work of breathing. CTABL Heart: RRR no MRG Abd: NABS, Soft. Nondistended, Nontender Exts: Brisk capillary refill, warm and well perfused.   Lab and Radiology Results No results found for this or any previous  visit (from the past 72 hour(s)). No results found.    Assessment and Plan: 57 y.o. male with  Profound hypoglycemia.  This was quite dangerous.  Plan to dramatically reduce insulin down to 40 units daily from previous 60 units.  Plan to continue frequent blood sugar checking 3 times daily.  Ultimately we will try to get to freestyle libre meter which would be more helpful.  Patient will give me a blood sugar update over the next few days we will continue to titrate the insulin.  Recheck as scheduled September 20.  Carpal tunnel syndrome: Wait for blood sugar to stabilize a bit before proceeding with carpal tunnel injection.  Fatigue: Likely sleep apnea.  Change sleep study order to a split-night sleep study and a sleep lab.  Orders Placed This Encounter  Procedures  . Split night study    Scheduling Instructions:     Excessive daytime sleepiness, fatigue, snoring, evaluate for sleep apnea, and titrate CPAP if necessary.    Order Specific Question:   Where should this test be performed:    Answer:   [facility] ordered this encounter  Medications  . Continuous Blood Gluc Sensor (FREESTYLE LIBRE 14 DAY SENSOR) MISC    Sig: 1 Units by Does not apply route every 14 (fourteen)  days. Use to check blood sugar daily, change sensor every 14 days. Dx: E11.40.    Dispense:  2 each    Refill:  3  . Continuous Blood Gluc Receiver (FREESTYLE LIBRE 14 DAY READER) DEVI    Sig: 1 Units by Does not apply route every 14 (fourteen) days. Use to check blood sugar daily, pairs with Freestyle Libra sensors. Please include sensors with device. Change sensor every 14 days. Dx: E11.40.    Dispense:  1 Device    Refill:  0  . Insulin Glargine (TOUJEO MAX SOLOSTAR) 300 UNIT/ML SOPN    Sig: Inject 40 Units into the skin daily.    Dispense:  8 pen    Refill:  3    Please consider 90 day supplies to promote better adherence     Historical information moved to improve visibility of  documentation.  Past Medical History:  Diagnosis Date  . Diabetes mellitus ([place])   . GAD (generalized anxiety disorder) 07/17/2015  . High triglycerides   . Hypertension   . MDD (major depressive disorder) 03/17/2018   Past Surgical History:  Procedure Laterality Date  . TONSILLECTOMY     Social History   Tobacco Use  . Smoking status: Never Smoker  . Smokeless tobacco: Never Used  Substance Use Topics  . Alcohol use: Yes   family history includes Heart failure in his mother.  Medications: Current Outpatient Medications  Medication Sig Dispense Refill  . ALPRAZolam (XANAX) 2 MG tablet Take 1 tablet (2 mg total) by mouth 3 (three) times daily as needed for anxiety. 90 tablet 1  . AMBULATORY NON FORMULARY MEDICATION Single glucometer of choice with lancets, test strips. Test 3x daily.  E11.65 Disp QS 1 months 1 each 0  . AMBULATORY NON FORMULARY MEDICATION One year of exercise at a gym for his overall health and medical conditions/strength/endurance training. 100 Device 11  . buPROPion (WELLBUTRIN XL) 150 MG 24 hr tablet Take 1 tablet (150 mg total) by mouth every morning. 30 tablet 1  . Continuous Blood Gluc Receiver (FREESTYLE LIBRE 14 DAY READER) DEVI 1 Units by Does not apply route every 14 (fourteen) days. Use to check blood sugar daily, pairs with Freestyle Libra sensors. Please include sensors with device. Change sensor every 14 days. Dx: E11.40. 1 Device 0  . Continuous Blood Gluc Sensor (FREESTYLE LIBRE 14 DAY SENSOR) MISC 1 Units by Does not apply route every 14 (fourteen) days. Use to check blood sugar daily, change sensor every 14 days. Dx: E11.40. 2 each 3  . dapagliflozin propanediol (FARXIGA) 10 MG TABS tablet Take 10 mg daily by mouth. 90 tablet 3  . diphenoxylate-atropine (LOMOTIL) 2.5-0.025 MG tablet TAKE 1 TABLET BY MOUTH 4 TIMES DAILY AS NEEDED FOR DIARRHEA OR  LOOSE  STOOLS 120 tablet 3  . gabapentin (NEURONTIN) 300 MG capsule One tab PO qHS for a week, then BID  for a week, then TID. May double weekly to a max of 3,600mg /day 180 capsule 0  . Gabapentin, Once-Daily, (GRALISE) 600 MG TABS Take by mouth.    . Insulin Glargine (TOUJEO MAX SOLOSTAR) 300 UNIT/ML SOPN Inject 40 Units into the skin daily. 8 pen 3  . Insulin Pen Needle (ADVOCATE INSULIN PEN NEEDLES) 33G X 4 MM MISC 1 each by Does not apply route 2 (two) times daily. 100 each 12  . l-methylfolate-B6-B12 (METANX) 3-35-2 MG TABS tablet Take 1 tablet by mouth daily. 30 tablet 5  . lactase (LACTAID) 3000 units tablet Take 1  tablet (3,000 Units total) by mouth 3 (three) times daily as needed. 120 tablet 3  . levocetirizine (XYZAL) 5 MG tablet Take 1 tablet (5 mg total) by mouth every evening. 90 tablet 4  . meloxicam (MOBIC) 15 MG tablet Take 1 tablet (15 mg total) by mouth daily. 90 tablet 2  . nitroGLYCERIN (NITRODUR - DOSED IN MG/24 HR) 0.2 mg/hr patch 1/8 to 1/4 patch to both elbows daily for tendonitis 30 patch 1  . omeprazole (PRILOSEC) 40 MG capsule Take 1 capsule (40 mg total) by mouth daily. 90 capsule 3  . oxyCODONE-acetaminophen (PERCOCET) 7.5-325 MG tablet Take 1 tablet by mouth daily as needed. 30 tablet 0  . sitaGLIPtin-metformin (JANUMET) 50-1000 MG tablet Take 1 tablet 2 (two) times daily with a meal by mouth. 180 tablet 3   No current facility-administered medications for this visit.    Allergies  Allergen Reactions  . Bee Venom Anaphylaxis  . Penicillins   . Terramycin [Oxytetracycline]      Discussed warning signs or symptoms. Please see discharge instructions. Patient expresses understanding.

## 2018-03-28 NOTE — Patient Instructions (Addendum)
Thank you for coming in today. Continue to check sugars 3x daily.  I am working to get the [employer] until then.  Reduce insulin to 40 unts from 60.  Send me updates on the blood sugar in a few days.  We will continue to adjust the insulin.  Goal is for fasting sugars to be in the 80-120 range most of time.  If we are going err let do that higher side.

## 2018-03-31 DIAGNOSIS — E1141 Type 2 diabetes mellitus with diabetic mononeuropathy: Secondary | ICD-10-CM | POA: Diagnosis not present

## 2018-03-31 DIAGNOSIS — E1142 Type 2 diabetes mellitus with diabetic polyneuropathy: Secondary | ICD-10-CM | POA: Diagnosis not present

## 2018-03-31 DIAGNOSIS — G894 Chronic pain syndrome: Secondary | ICD-10-CM | POA: Diagnosis not present

## 2018-03-31 DIAGNOSIS — E1165 Type 2 diabetes mellitus with hyperglycemia: Secondary | ICD-10-CM | POA: Diagnosis not present

## 2018-03-31 DIAGNOSIS — M47812 Spondylosis without myelopathy or radiculopathy, cervical region: Secondary | ICD-10-CM | POA: Diagnosis not present

## 2018-04-02 ENCOUNTER — Encounter: Payer: Self-pay | Admitting: Family Medicine

## 2018-04-04 ENCOUNTER — Encounter: Payer: Self-pay | Admitting: Family Medicine

## 2018-04-05 ENCOUNTER — Encounter: Payer: Self-pay | Admitting: Family Medicine

## 2018-04-06 ENCOUNTER — Encounter: Payer: Self-pay | Admitting: Family Medicine

## 2018-04-10 ENCOUNTER — Encounter: Payer: Self-pay | Admitting: Family Medicine

## 2018-04-10 ENCOUNTER — Ambulatory Visit (INDEPENDENT_AMBULATORY_CARE_PROVIDER_SITE_OTHER): Payer: BLUE CROSS/BLUE SHIELD | Admitting: Family Medicine

## 2018-04-10 ENCOUNTER — Telehealth: Payer: Self-pay | Admitting: Family Medicine

## 2018-04-10 VITALS — BP 162/112 | HR 116 | Wt 208.0 lb

## 2018-04-10 DIAGNOSIS — Z794 Long term (current) use of insulin: Secondary | ICD-10-CM | POA: Diagnosis not present

## 2018-04-10 DIAGNOSIS — I10 Essential (primary) hypertension: Secondary | ICD-10-CM | POA: Diagnosis not present

## 2018-04-10 DIAGNOSIS — J90 Pleural effusion, not elsewhere classified: Secondary | ICD-10-CM | POA: Diagnosis not present

## 2018-04-10 DIAGNOSIS — R42 Dizziness and giddiness: Secondary | ICD-10-CM

## 2018-04-10 DIAGNOSIS — R079 Chest pain, unspecified: Secondary | ICD-10-CM | POA: Diagnosis not present

## 2018-04-10 DIAGNOSIS — I2510 Atherosclerotic heart disease of native coronary artery without angina pectoris: Secondary | ICD-10-CM | POA: Diagnosis not present

## 2018-04-10 DIAGNOSIS — I517 Cardiomegaly: Secondary | ICD-10-CM | POA: Diagnosis not present

## 2018-04-10 DIAGNOSIS — R0601 Orthopnea: Secondary | ICD-10-CM

## 2018-04-10 DIAGNOSIS — R609 Edema, unspecified: Secondary | ICD-10-CM | POA: Diagnosis not present

## 2018-04-10 DIAGNOSIS — R918 Other nonspecific abnormal finding of lung field: Secondary | ICD-10-CM | POA: Diagnosis not present

## 2018-04-10 DIAGNOSIS — R5383 Other fatigue: Secondary | ICD-10-CM | POA: Diagnosis not present

## 2018-04-10 DIAGNOSIS — I429 Cardiomyopathy, unspecified: Secondary | ICD-10-CM | POA: Diagnosis not present

## 2018-04-10 DIAGNOSIS — E1140 Type 2 diabetes mellitus with diabetic neuropathy, unspecified: Secondary | ICD-10-CM | POA: Diagnosis not present

## 2018-04-10 DIAGNOSIS — R000 Tachycardia, unspecified: Secondary | ICD-10-CM | POA: Diagnosis not present

## 2018-04-10 DIAGNOSIS — I509 Heart failure, unspecified: Secondary | ICD-10-CM | POA: Diagnosis not present

## 2018-04-10 DIAGNOSIS — Z79899 Other long term (current) drug therapy: Secondary | ICD-10-CM | POA: Diagnosis not present

## 2018-04-10 DIAGNOSIS — Z7982 Long term (current) use of aspirin: Secondary | ICD-10-CM | POA: Diagnosis not present

## 2018-04-10 DIAGNOSIS — R0789 Other chest pain: Secondary | ICD-10-CM | POA: Diagnosis not present

## 2018-04-10 DIAGNOSIS — E119 Type 2 diabetes mellitus without complications: Secondary | ICD-10-CM | POA: Diagnosis not present

## 2018-04-10 DIAGNOSIS — I110 Hypertensive heart disease with heart failure: Secondary | ICD-10-CM | POA: Diagnosis not present

## 2018-04-10 DIAGNOSIS — R0602 Shortness of breath: Secondary | ICD-10-CM | POA: Diagnosis not present

## 2018-04-10 LAB — COMPLETE METABOLIC PANEL WITH GFR
AG Ratio: 1.5 (calc) (ref 1.0–2.5)
ALBUMIN MSPROF: 3.9 g/dL (ref 3.6–5.1)
ALT: 60 U/L — ABNORMAL HIGH (ref 9–46)
AST: 38 U/L — ABNORMAL HIGH (ref 10–35)
Alkaline phosphatase (APISO): 68 U/L (ref 40–115)
BILIRUBIN TOTAL: 0.8 mg/dL (ref 0.2–1.2)
BUN: 20 mg/dL (ref 7–25)
CHLORIDE: 105 mmol/L (ref 98–110)
CO2: 26 mmol/L (ref 20–32)
Calcium: 9.1 mg/dL (ref 8.6–10.3)
Creat: 1.26 mg/dL (ref 0.70–1.33)
GFR, Est African American: 73 mL/min/{1.73_m2} (ref >=60)
GFR, Est Non African American: 63 mL/min/{1.73_m2} (ref >=60)
GLOBULIN: 2.6 g/dL (ref 1.9–3.7)
Glucose, Bld: 148 mg/dL — ABNORMAL HIGH (ref 65–99)
POTASSIUM: 4.6 mmol/L (ref 3.5–5.3)
SODIUM: 137 mmol/L (ref 135–146)
Total Protein: 6.5 g/dL (ref 6.1–8.1)

## 2018-04-10 LAB — BRAIN NATRIURETIC PEPTIDE: BRAIN NATRIURETIC PEPTIDE: 792 pg/mL — AB (ref <100)

## 2018-04-10 LAB — TROPONIN I: Troponin I: 0.09 ng/mL (ref <=0.0)

## 2018-04-10 MED ORDER — FUROSEMIDE 40 MG PO TABS: 40.0000 mg | ORAL_TABLET | Freq: Every day | ORAL | 3 refills | Status: DC

## 2018-04-10 NOTE — Patient Instructions (Addendum)
Thank you for coming in today. Get labs now.  Take lasix daily If you worsen go to the emergency room.  You should hear soon about echo cardiogram.   Recheck in 1 week.  Return sooner if needed.   Call or go to the emergency room if you get worse, have trouble breathing, have chest pains, or palpitations.

## 2018-04-10 NOTE — Telephone Encounter (Signed)
I even called pt's home phone number ( in addition to leaving message on his personal cell) and message stated that "the magic Seth Weeks customer that you aRE TRYING TO CALL IS NOT AVAILABLE" .   Because of this automated response, I did not feel comfortable leaving personal message on this seemingly 'work phone number.'

## 2018-04-10 NOTE — Progress Notes (Signed)
Seth Weeks is a 57 y.o. male who presents to [facility] today for shortness of breath.   Seth Weeks had a syncopal event at work in late August.  He was seen on August 27 for follow-up.  At the time of his syncopal event his blood sugar had bottomed out.  He was seen by EMT but declined transport to the emergency department.  I was mistaken my previous notes and he was actually seen in the ED.  In the interval his insulin has been dramatically reduced and his blood sugars have been typically pretty well controlled in the 150s on 10 units of insulin.  However he notes new onset significant lower extremity swelling and shortness of breath and lightheadedness and dizziness.  He notes fatigue which has been ongoing and worsening over the last few months.  The shortness of breath leg swelling and lightheadedness are new however.  He denies chest pain or palpitations.  He notes he had a limited heart work-up in 2014 with an echocardiogram that was largely normal.  He notes he is fatigued and having difficulty attending work because of his illness.  He notes over the last several days developing orthopnea.  He has to sleep propped up.  He has shortness of breath worse with activity.   ROS as above: No headache, visual changes, nausea, vomiting, diarrhea, constipation, dizziness, abdominal pain, skin rash, fevers, chills, night sweats, weight loss, swollen lymph nodes, new body aches, joint swelling, muscle aches, chest pain, , mood changes, visual or auditory hallucinations.    Exam:  BP (!) 162/112   Pulse (!) 116   Wt 208 lb (94.3 kg)   SpO2 98%   BMI 31.17 kg/m  Wt Readings from Last 5 Encounters:  04/10/18 208 lb (94.3 kg)  03/28/18 205 lb (93 kg)  03/17/18 207 lb (93.9 kg)  02/03/18 203 lb (92.1 kg)  06/16/17 195 lb (88.5 kg)    Gen: Well NAD HEENT: EOMI,  MMM Lungs: Normal  work of breathing. CTABL Heart: Tachycardia but regular no MRG Abd: NABS, Soft. Nondistended, Nontender Exts: Brisk capillary refill, warm and well perfused.  2+ edema bilateral lower extremities.  Twelve-lead EKG shows sinus tachycardia at 114 bpm.  No ST segment elevation or depression.  Flattened lateral precordial T waves.  Partial bundle branch block appearance EKG change compared to prior study from 2015.  Lab and Radiology Results   Chemistry      Component Value Date/Time   NA 137 03/17/2018 1205   K 4.4 03/17/2018 1205   CL 103 03/17/2018 1205   CO2 27 03/17/2018 1205   BUN 20 03/17/2018 1205   CREATININE 1.14 03/17/2018 1205      Component Value Date/Time   CALCIUM 8.4 (L) 03/17/2018 1205   ALKPHOS 82 01/04/2015 1633   AST 45 (H) 03/17/2018 1205   ALT 73 (H) 03/17/2018 1205   BILITOT 0.7 03/17/2018 1205     Lab Results  Component Value Date   TSH 1.01 03/17/2018   Lab Results  Component Value Date   WBC 9.7 03/17/2018   HGB 14.4 03/17/2018   HCT 46.1 03/17/2018   MCV 80.2 03/17/2018   PLT 198 03/17/2018    Chest x-ray pending   Assessment and Plan: 57 y.o. male with  New leg swelling shortness of breath and lightheadedness.  This is concerning for heart failure or kidney failure potentially even liver failure.  EKG today shows  evidence of delayed conduction through the septum concerning for subacute or old septal infarct.  Patient does not appear to be in acute distress currently and refuses transfer to ED after discussion.  Plan for urgent work-up in the outpatient setting with complete metabolic panel, BNP, troponin.  Additionally will arrange for fast echocardiogram.  If worsening next step would be transferred to ED.  Chest x-ray ordered but not obtained yet.  Check in 1 week or sooner if needed.  Empiric treatment now with Lasix for diuresis.  Diabetes doing reasonably well continue current regimen.  Orders Placed This Encounter  Procedures  . DG  Chest 2 View    Order Specific Question:   Reason for exam:    Answer:   Cough, assess intra-thoracic pathology    Order Specific Question:   Preferred imaging location?    Answer:   [facility]  . COMPLETE METABOLIC PANEL WITH GFR  . B Nat Peptide  . Troponin I  . EKG 12-Lead  . ECHOCARDIOGRAM COMPLETE    Standing Status:   Future    Standing Expiration Date:   07/11/2019    Order Specific Question:   Where should this test be performed    Answer:   [facility]    Order Specific Question:   Perflutren DEFINITY (image enhancing agent) should be administered unless hypersensitivity or allergy exist    Answer:   Administer Perflutren   Meds ordered this encounter  Medications  . furosemide (LASIX) 40 MG tablet    Sig: Take 1 tablet (40 mg total) by mouth daily.    Dispense:  30 tablet    Refill:  3     Historical information moved to improve visibility of documentation.  Past Medical History:  Diagnosis Date  . Diabetes mellitus ([place])   . GAD (generalized anxiety disorder) 07/17/2015  . High triglycerides   . Hypertension   . MDD (major depressive disorder) 03/17/2018   Past Surgical History:  Procedure Laterality Date  . TONSILLECTOMY     Social History   Tobacco Use  . Smoking status: Never Smoker  . Smokeless tobacco: Never Used  Substance Use Topics  . Alcohol use: Yes   family history includes Heart failure in his mother.  Medications: Current Outpatient Medications  Medication Sig Dispense Refill  . ALPRAZolam (XANAX) 2 MG tablet Take 1 tablet (2 mg total) by mouth 3 (three) times daily as needed for anxiety. 90 tablet 1  . AMBULATORY NON FORMULARY MEDICATION Single glucometer of choice with lancets, test strips. Test 3x daily.  E11.65 Disp QS 1 months 1 each 0  . AMBULATORY NON FORMULARY MEDICATION One year of exercise at a gym for his overall health and medical conditions/strength/endurance training. 100 Device 11  . buPROPion  (WELLBUTRIN XL) 150 MG 24 hr tablet Take 1 tablet (150 mg total) by mouth every morning. 30 tablet 1  . Continuous Blood Gluc Receiver (FREESTYLE LIBRE 14 DAY READER) DEVI 1 Units by Does not apply route every 14 (fourteen) days. Use to check blood sugar daily, pairs with Freestyle Libra sensors. Please include sensors with device. Change sensor every 14 days. Dx: E11.40. 1 Device 0  . Continuous Blood Gluc Sensor (FREESTYLE LIBRE 14 DAY SENSOR) MISC 1 Units by Does not apply route every 14 (fourteen) days. Use to check blood sugar daily, change sensor every 14 days. Dx: E11.40. 2 each 3  . dapagliflozin propanediol (FARXIGA) 10 MG TABS tablet Take 10 mg daily by mouth. [place]  tablet 3  . diphenoxylate-atropine (LOMOTIL) 2.5-0.025 MG tablet TAKE 1 TABLET BY MOUTH 4 TIMES DAILY AS NEEDED FOR DIARRHEA OR  LOOSE  STOOLS 120 tablet 3  . gabapentin (NEURONTIN) 300 MG capsule One tab PO qHS for a week, then BID for a week, then TID. May double weekly to a max of 3,600mg /day 180 capsule 0  . Gabapentin, Once-Daily, (GRALISE) 600 MG TABS Take by mouth.    . Insulin Glargine (TOUJEO MAX SOLOSTAR) 300 UNIT/ML SOPN Inject 40 Units into the skin daily. 8 pen 3  . Insulin Pen Needle (ADVOCATE INSULIN PEN NEEDLES) 33G X 4 MM MISC 1 each by Does not apply route 2 (two) times daily. 100 each 12  . l-methylfolate-B6-B12 (METANX) 3-35-2 MG TABS tablet Take 1 tablet by mouth daily. 30 tablet 5  . lactase (LACTAID) 3000 units tablet Take 1 tablet (3,000 Units total) by mouth 3 (three) times daily as needed. 120 tablet 3  . levocetirizine (XYZAL) 5 MG tablet Take 1 tablet (5 mg total) by mouth every evening. 90 tablet 4  . meloxicam (MOBIC) 15 MG tablet Take 1 tablet (15 mg total) by mouth daily. 90 tablet 2  . nitroGLYCERIN (NITRODUR - DOSED IN MG/24 HR) 0.2 mg/hr patch 1/8 to 1/4 patch to both elbows daily for tendonitis 30 patch 1  . omeprazole (PRILOSEC) 40 MG capsule Take 1 capsule (40 mg total) by mouth daily. 90 capsule  3  . oxyCODONE-acetaminophen (PERCOCET) 7.5-325 MG tablet Take 1 tablet by mouth daily as needed. 30 tablet 0  . sitaGLIPtin-metformin (JANUMET) 50-1000 MG tablet Take 1 tablet 2 (two) times daily with a meal by mouth. 180 tablet 3  . furosemide (LASIX) 40 MG tablet Take 1 tablet (40 mg total) by mouth daily. 30 tablet 3   No current facility-administered medications for this visit.    Allergies  Allergen Reactions  . Bee Venom Anaphylaxis  . Penicillins   . Terramycin [Oxytetracycline]      Discussed warning signs or symptoms. Please see discharge instructions. Patient expresses understanding.

## 2018-04-10 NOTE — Telephone Encounter (Signed)
I called back to the on-call nurse line and spoke with the charge nurse.  I asked that they continue to try to reach pt and tell him he NEEDS to proceed to the ER at this time due to his symptoms along with lab abnormalities.

## 2018-04-10 NOTE — Telephone Encounter (Signed)
Called pt on his phone tonight several times in attempt to get in touch with him.  Unfortunately I could not verify voicemail was of pt's as I could not make out what was said on it.    I left message telling pt I was the on-call doc and due to abn labs and his current sx he had while seeing Dr Georgina Snell in office today ( I reviewed notes) it is best he proceed to the ER at this time.  I told pt he can also call dr Georgina Snell in am for further details of his labs if he would like to discuss results specifically with his doc.

## 2018-04-11 DIAGNOSIS — R42 Dizziness and giddiness: Secondary | ICD-10-CM | POA: Diagnosis not present

## 2018-04-11 DIAGNOSIS — R0602 Shortness of breath: Secondary | ICD-10-CM | POA: Diagnosis not present

## 2018-04-11 DIAGNOSIS — I313 Pericardial effusion (noninflammatory): Secondary | ICD-10-CM | POA: Diagnosis not present

## 2018-04-11 DIAGNOSIS — R000 Tachycardia, unspecified: Secondary | ICD-10-CM | POA: Insufficient documentation

## 2018-04-11 DIAGNOSIS — I509 Heart failure, unspecified: Secondary | ICD-10-CM | POA: Diagnosis not present

## 2018-04-11 DIAGNOSIS — E118 Type 2 diabetes mellitus with unspecified complications: Secondary | ICD-10-CM | POA: Diagnosis not present

## 2018-04-11 DIAGNOSIS — I428 Other cardiomyopathies: Secondary | ICD-10-CM | POA: Insufficient documentation

## 2018-04-11 DIAGNOSIS — J90 Pleural effusion, not elsewhere classified: Secondary | ICD-10-CM | POA: Diagnosis not present

## 2018-04-11 DIAGNOSIS — I700 Atherosclerosis of aorta: Secondary | ICD-10-CM | POA: Diagnosis not present

## 2018-04-11 DIAGNOSIS — I3139 Other pericardial effusion (noninflammatory): Secondary | ICD-10-CM | POA: Insufficient documentation

## 2018-04-11 DIAGNOSIS — I071 Rheumatic tricuspid insufficiency: Secondary | ICD-10-CM | POA: Diagnosis not present

## 2018-04-11 DIAGNOSIS — I10 Essential (primary) hypertension: Secondary | ICD-10-CM | POA: Diagnosis not present

## 2018-04-11 DIAGNOSIS — I5022 Chronic systolic (congestive) heart failure: Secondary | ICD-10-CM | POA: Insufficient documentation

## 2018-04-11 DIAGNOSIS — I5189 Other ill-defined heart diseases: Secondary | ICD-10-CM | POA: Diagnosis not present

## 2018-04-11 HISTORY — DX: Heart failure, unspecified: I50.9

## 2018-04-11 MED ORDER — GENERIC EXTERNAL MEDICATION
10.00 | Status: DC
Start: ? — End: 2018-04-11

## 2018-04-11 MED ORDER — ASPIRIN EC 325 MG PO TBEC
325.00 | DELAYED_RELEASE_TABLET | ORAL | Status: DC
Start: 2018-04-14 — End: 2018-04-11

## 2018-04-11 MED ORDER — NITROGLYCERIN 0.4 MG SL SUBL
0.40 | SUBLINGUAL_TABLET | SUBLINGUAL | Status: DC
Start: ? — End: 2018-04-11

## 2018-04-11 MED ORDER — ENOXAPARIN SODIUM 40 MG/0.4ML ~~LOC~~ SOLN
40.00 | SUBCUTANEOUS | Status: DC
Start: 2018-04-12 — End: 2018-04-11

## 2018-04-11 MED ORDER — METOPROLOL TARTRATE 5 MG/5ML IV SOLN
5.00 | INTRAVENOUS | Status: DC
Start: ? — End: 2018-04-11

## 2018-04-11 MED ORDER — INSULIN GLARGINE 100 UNIT/ML ~~LOC~~ SOLN
1.00 | SUBCUTANEOUS | Status: DC
Start: 2018-04-14 — End: 2018-04-11

## 2018-04-11 MED ORDER — NITROGLYCERIN 2 % TD OINT
1.00 | TOPICAL_OINTMENT | TRANSDERMAL | Status: DC
Start: 2018-04-14 — End: 2018-04-11

## 2018-04-11 MED ORDER — LEVETIRACETAM 500 MG PO TABS
250.00 | ORAL_TABLET | ORAL | Status: DC
Start: 2018-04-14 — End: 2018-04-11

## 2018-04-11 MED ORDER — TOPIRAMATE 100 MG PO TABS
100.00 | ORAL_TABLET | ORAL | Status: DC
Start: 2018-04-14 — End: 2018-04-11

## 2018-04-11 MED ORDER — LACTASE 3000 UNITS PO TABS
3000.00 | ORAL_TABLET | ORAL | Status: DC
Start: 2018-04-14 — End: 2018-04-11

## 2018-04-11 MED ORDER — GABAPENTIN 300 MG PO CAPS
300.00 | ORAL_CAPSULE | ORAL | Status: DC
Start: 2018-04-14 — End: 2018-04-11

## 2018-04-11 MED ORDER — INSULIN LISPRO 100 UNIT/ML ~~LOC~~ SOLN
1.00 | SUBCUTANEOUS | Status: DC
Start: 2018-04-14 — End: 2018-04-11

## 2018-04-11 MED ORDER — DICLOFENAC SODIUM 75 MG PO TBEC
75.00 | DELAYED_RELEASE_TABLET | ORAL | Status: DC
Start: 2018-04-11 — End: 2018-04-11

## 2018-04-11 MED ORDER — DAPAGLIFLOZIN PROPANEDIOL 10 MG PO TABS
10.00 | ORAL_TABLET | ORAL | Status: DC
Start: 2018-04-12 — End: 2018-04-11

## 2018-04-11 MED ORDER — GENERIC EXTERNAL MEDICATION
1.00 | Status: DC
Start: ? — End: 2018-04-11

## 2018-04-11 MED ORDER — INSULIN GLARGINE 100 UNIT/ML ~~LOC~~ SOLN
1.00 | SUBCUTANEOUS | Status: DC
Start: ? — End: 2018-04-11

## 2018-04-11 MED ORDER — PANTOPRAZOLE SODIUM 40 MG PO TBEC
40.00 | DELAYED_RELEASE_TABLET | ORAL | Status: DC
Start: 2018-04-14 — End: 2018-04-11

## 2018-04-11 MED ORDER — ALUM & MAG HYDROXIDE-SIMETH 200-200-20 MG/5ML PO SUSP
30.00 | ORAL | Status: DC
Start: ? — End: 2018-04-11

## 2018-04-11 MED ORDER — OXYCODONE HCL 5 MG PO TABS
5.00 | ORAL_TABLET | ORAL | Status: DC
Start: ? — End: 2018-04-11

## 2018-04-11 MED ORDER — INSULIN LISPRO 100 UNIT/ML ~~LOC~~ SOLN
1.00 | SUBCUTANEOUS | Status: DC
Start: ? — End: 2018-04-11

## 2018-04-11 MED ORDER — SODIUM CHLORIDE 0.9 % IV SOLN
10.00 | INTRAVENOUS | Status: DC
Start: ? — End: 2018-04-11

## 2018-04-11 MED ORDER — FUROSEMIDE 10 MG/ML IJ SOLN
20.00 | INTRAMUSCULAR | Status: DC
Start: 2018-04-11 — End: 2018-04-11

## 2018-04-11 MED ORDER — ALPRAZOLAM 0.25 MG PO TABS
0.25 | ORAL_TABLET | ORAL | Status: DC
Start: ? — End: 2018-04-11

## 2018-04-11 NOTE — Telephone Encounter (Signed)
Agree. I spoke with Dr Raliegh Scarlet yesterday evening and I agree with her plan.  We will continue to attempt to contact patient today.

## 2018-04-12 DIAGNOSIS — E118 Type 2 diabetes mellitus with unspecified complications: Secondary | ICD-10-CM | POA: Diagnosis not present

## 2018-04-12 DIAGNOSIS — I509 Heart failure, unspecified: Secondary | ICD-10-CM | POA: Diagnosis not present

## 2018-04-12 DIAGNOSIS — I10 Essential (primary) hypertension: Secondary | ICD-10-CM | POA: Diagnosis not present

## 2018-04-12 DIAGNOSIS — I420 Dilated cardiomyopathy: Secondary | ICD-10-CM | POA: Diagnosis not present

## 2018-04-12 DIAGNOSIS — R000 Tachycardia, unspecified: Secondary | ICD-10-CM | POA: Diagnosis not present

## 2018-04-12 DIAGNOSIS — I5023 Acute on chronic systolic (congestive) heart failure: Secondary | ICD-10-CM | POA: Diagnosis not present

## 2018-04-13 ENCOUNTER — Telehealth: Payer: Self-pay | Admitting: Family Medicine

## 2018-04-13 ENCOUNTER — Encounter: Payer: Self-pay | Admitting: Family Medicine

## 2018-04-13 DIAGNOSIS — R000 Tachycardia, unspecified: Secondary | ICD-10-CM | POA: Diagnosis not present

## 2018-04-13 DIAGNOSIS — I509 Heart failure, unspecified: Secondary | ICD-10-CM | POA: Diagnosis not present

## 2018-04-13 DIAGNOSIS — I420 Dilated cardiomyopathy: Secondary | ICD-10-CM | POA: Diagnosis not present

## 2018-04-13 DIAGNOSIS — E118 Type 2 diabetes mellitus with unspecified complications: Secondary | ICD-10-CM | POA: Diagnosis not present

## 2018-04-13 DIAGNOSIS — I10 Essential (primary) hypertension: Secondary | ICD-10-CM | POA: Diagnosis not present

## 2018-04-13 DIAGNOSIS — I5023 Acute on chronic systolic (congestive) heart failure: Secondary | ICD-10-CM | POA: Diagnosis not present

## 2018-04-13 NOTE — Telephone Encounter (Signed)
Patient hospitalized for heart failure.  Short-term disability form received and will be sent in today. We will follow-up after hospitalization.

## 2018-04-14 ENCOUNTER — Ambulatory Visit: Payer: Self-pay | Admitting: Family Medicine

## 2018-04-14 ENCOUNTER — Ambulatory Visit (HOSPITAL_BASED_OUTPATIENT_CLINIC_OR_DEPARTMENT_OTHER): Payer: BLUE CROSS/BLUE SHIELD

## 2018-04-14 DIAGNOSIS — I2510 Atherosclerotic heart disease of native coronary artery without angina pectoris: Secondary | ICD-10-CM | POA: Insufficient documentation

## 2018-04-14 DIAGNOSIS — I509 Heart failure, unspecified: Secondary | ICD-10-CM | POA: Diagnosis not present

## 2018-04-14 DIAGNOSIS — I428 Other cardiomyopathies: Secondary | ICD-10-CM | POA: Diagnosis not present

## 2018-04-16 MED ORDER — CARVEDILOL 6.25 MG PO TABS
6.25 | ORAL_TABLET | ORAL | Status: DC
Start: 2018-04-14 — End: 2018-04-16

## 2018-04-16 MED ORDER — FUROSEMIDE 10 MG/ML IJ SOLN
40.00 | INTRAMUSCULAR | Status: DC
Start: 2018-04-14 — End: 2018-04-16

## 2018-04-16 MED ORDER — SACUBITRIL-VALSARTAN 24-26 MG PO TABS
1.00 | ORAL_TABLET | ORAL | Status: DC
Start: 2018-04-14 — End: 2018-04-16

## 2018-04-16 MED ORDER — SPIRONOLACTONE 25 MG PO TABS
25.00 | ORAL_TABLET | ORAL | Status: DC
Start: 2018-04-14 — End: 2018-04-16

## 2018-04-16 MED ORDER — SODIUM CHLORIDE 0.9 % IV SOLN
50.00 | INTRAVENOUS | Status: DC
Start: ? — End: 2018-04-16

## 2018-04-18 DIAGNOSIS — I2510 Atherosclerotic heart disease of native coronary artery without angina pectoris: Secondary | ICD-10-CM | POA: Diagnosis not present

## 2018-04-18 DIAGNOSIS — I428 Other cardiomyopathies: Secondary | ICD-10-CM | POA: Diagnosis not present

## 2018-04-18 DIAGNOSIS — E119 Type 2 diabetes mellitus without complications: Secondary | ICD-10-CM | POA: Diagnosis not present

## 2018-04-18 DIAGNOSIS — I5022 Chronic systolic (congestive) heart failure: Secondary | ICD-10-CM | POA: Diagnosis not present

## 2018-04-18 DIAGNOSIS — Z794 Long term (current) use of insulin: Secondary | ICD-10-CM | POA: Diagnosis not present

## 2018-04-19 LAB — LIPID PANEL
CHOLESTEROL: 184 (ref 0–200)
HDL: 35 (ref 35–70)
LDL Cholesterol: 113
Triglycerides: 178 — AB (ref 40–160)

## 2018-04-19 LAB — BASIC METABOLIC PANEL
BUN: 24 — AB (ref 4–21)
CREATININE: 1.3 (ref 0.6–1.3)
GLUCOSE: 196
Potassium: 4.4 (ref 3.4–5.3)
Sodium: 137 (ref 137–147)

## 2018-04-19 LAB — HEMOGLOBIN A1C: HEMOGLOBIN A1C: 6.3 % — AB (ref 4.0–5.6)

## 2018-04-20 ENCOUNTER — Encounter: Payer: Self-pay | Admitting: Family Medicine

## 2018-04-20 ENCOUNTER — Telehealth: Payer: Self-pay | Admitting: Family Medicine

## 2018-04-20 NOTE — Telephone Encounter (Signed)
Short-term disability form received.  Requesting medical records.  I printed and will mail back my last office note as well as no [facility] office documentation.  Document exceeds 50 pages.  Will mail  As too large to fax.

## 2018-04-21 ENCOUNTER — Encounter: Payer: Self-pay | Admitting: Family Medicine

## 2018-04-21 ENCOUNTER — Ambulatory Visit (INDEPENDENT_AMBULATORY_CARE_PROVIDER_SITE_OTHER): Payer: BLUE CROSS/BLUE SHIELD | Admitting: Family Medicine

## 2018-04-21 VITALS — BP 125/78 | HR 96 | Ht 68.5 in | Wt 190.0 lb

## 2018-04-21 DIAGNOSIS — E1140 Type 2 diabetes mellitus with diabetic neuropathy, unspecified: Secondary | ICD-10-CM

## 2018-04-21 DIAGNOSIS — I3139 Other pericardial effusion (noninflammatory): Secondary | ICD-10-CM

## 2018-04-21 DIAGNOSIS — I2510 Atherosclerotic heart disease of native coronary artery without angina pectoris: Secondary | ICD-10-CM | POA: Diagnosis not present

## 2018-04-21 DIAGNOSIS — I5022 Chronic systolic (congestive) heart failure: Secondary | ICD-10-CM

## 2018-04-21 DIAGNOSIS — I428 Other cardiomyopathies: Secondary | ICD-10-CM

## 2018-04-21 DIAGNOSIS — I313 Pericardial effusion (noninflammatory): Secondary | ICD-10-CM

## 2018-04-21 DIAGNOSIS — Z794 Long term (current) use of insulin: Secondary | ICD-10-CM

## 2018-04-21 DIAGNOSIS — I5021 Acute systolic (congestive) heart failure: Secondary | ICD-10-CM

## 2018-04-21 MED ORDER — INSULIN GLARGINE 300 UNIT/ML ~~LOC~~ SOPN: 12.0000 [IU] | PEN_INJECTOR | Freq: Every day | SUBCUTANEOUS | Status: DC

## 2018-04-21 NOTE — Patient Instructions (Signed)
Thank you for coming in today. Continue current medicines.  Ok to do light/medium exercise in a safe place.  Return sooner if needed.  Continue to adjust insulin.

## 2018-04-21 NOTE — Progress Notes (Signed)
Seth Weeks is a 57 y.o. male who presents to [place]: [place] today for hospital follow-up.  Seth Weeks was last seen on September 9th.  During that visit he had symptoms concerning for heart failure.  He had a work-up that showed elevated troponins and he was advised to go the emergency room.  Upon presentation in the ED he had evaluation and work-up showing systolic heart failure with reduced ejection fraction.  He was admitted to the hospital on September 9 and subsequently discharged on September 13.  He has had a follow-up with cardiology and had an outpatient cardiac catheterization on September 13 which did not show severe coronary artery disease.  He had mild coronary artery disease.  His current diagnosis is nonischemic cardiomyopathy and systolic heart failure with ejection fraction of 25 to 30%.  He has been prescribed on discharge and is advised to take 81 mg of aspirin, atorvastatin, 50 mg of metoprolol and 40 mg of Lasix.  He was in the process of being started on Entresto but on follow-up his blood pressure was a bit too low and it was delayed.  He is feeling a lot better and notes significantly reduced shortness of breath.  He denies any chest pain.  He notes continued exertional fatigue and dyspnea but that is improving significantly.  He is currently out of work and unable to work at the moment.  Additionally his diabetes has also been changing recently.  He had an episode of hypoglycemia recently.  His blood sugar regimen is currently being adjusted.  He takes medications listed below and is currently taking 12 units of Tresiba along with the medications listed below.  He notes his blood sugars are typically quite well controlled in the 100s.   ROS as above: No headache, visual changes, nausea, vomiting, diarrhea, constipation, dizziness, abdominal pain, skin rash, fevers,  chills, night sweats, weight loss, swollen lymph nodes, body aches, joint swelling, muscle aches, chest pain, shortness of breath, mood changes, visual or auditory hallucinations.    Exam:  BP 125/78   Pulse 96   Ht 5' 8.5" (1.74 m)   Wt 190 lb (86.2 kg)   BMI 28.47 kg/m  Wt Readings from Last 5 Encounters:  04/21/18 190 lb (86.2 kg)  04/10/18 208 lb (94.3 kg)  03/28/18 205 lb (93 kg)  03/17/18 207 lb (93.9 kg)  02/03/18 203 lb (92.1 kg)    Gen: Well NAD HEENT: EOMI,  MMM Lungs: Normal work of breathing. CTABL Heart: RRR no MRG Abd: NABS, Soft. Nondistended, Nontender Exts: Brisk capillary refill, warm and well perfused.   Lab and Radiology Results Reviewed and attached below.   Assessment and Plan: 57 y.o. male with  Systolic heart failure ejection fraction 25 to 30% due to nonischemic cardiomyopathy.  Unclear fundamental underlying cause.  Suspect diabetes or viral etiology.  Agree with cardiology plan.  Plan to proceed with current medications including Lasix beta-blocker.  Agree with Entresto.  Blood pressure to me looks acceptable today to start low-dose Entresto.  We will send a letter to Dr. Clent Jacks to see if he agrees.  If so I am happy to prescribe if needed.  Labs reviewed no acute issue to follow-up today with labs.  Recheck in 1 month.  Recommend mild exercise as tolerated.  Diabetes: Reasonably doing well.  Continue current regimen.  Continue to titrate insulin.  Wilder Glade should provide benefit from heart failure as well.  Consider switching  to Januvia in the future perhaps.  Work: Currently out of work which is reasonable given his recent heart failure diagnosis.  Recheck in 1 month to reassess readiness for return to work.  Return sooner if needed.   No orders of the defined types were placed in this encounter.  Meds ordered this encounter  Medications  . Insulin Glargine (TOUJEO MAX SOLOSTAR) 300 UNIT/ML SOPN    Sig: Inject 12 Units into the skin daily.     Please consider 90 day supplies to promote better adherence     Historical information moved to improve visibility of documentation.  Past Medical History:  Diagnosis Date  . CHF (congestive heart failure) ([place]) 04/11/2018  . Diabetes mellitus ([place])   . GAD (generalized anxiety disorder) 07/17/2015  . High triglycerides   . Hypertension   . MDD (major depressive disorder) 03/17/2018   Past Surgical History:  Procedure Laterality Date  . TONSILLECTOMY     Social History   Tobacco Use  . Smoking status: Never Smoker  . Smokeless tobacco: Never Used  Substance Use Topics  . Alcohol use: Yes   family history includes Heart failure in his mother.  Medications: Current Outpatient Medications  Medication Sig Dispense Refill  . ALPRAZolam (XANAX) 2 MG tablet Take 1 tablet (2 mg total) by mouth 3 (three) times daily as needed for anxiety. 90 tablet 1  . AMBULATORY NON FORMULARY MEDICATION Single glucometer of choice with lancets, test strips. Test 3x daily.  E11.65 Disp QS 1 months 1 each 0  . AMBULATORY NON FORMULARY MEDICATION One year of exercise at a gym for his overall health and medical conditions/strength/endurance training. 100 Device 11  . aspirin EC 81 MG tablet Take by mouth.    [name] atorvastatin (LIPITOR) 40 MG tablet Take by mouth.    [name] buPROPion (WELLBUTRIN XL) 150 MG 24 hr tablet Take 1 tablet (150 mg total) by mouth every morning. 30 tablet 1  . Continuous Blood Gluc Receiver (FREESTYLE LIBRE 14 DAY READER) DEVI 1 Units by Does not apply route every 14 (fourteen) days. Use to check blood sugar daily, pairs with Freestyle Libra sensors. Please include sensors with device. Change sensor every 14 days. Dx: E11.40. 1 Device 0  . Continuous Blood Gluc Sensor (FREESTYLE LIBRE 14 DAY SENSOR) MISC 1 Units by Does not apply route every 14 (fourteen) days. Use to check blood sugar daily, change sensor every 14 days. Dx: E11.40. 2 each 3  . dapagliflozin propanediol (FARXIGA) 10 MG  TABS tablet Take 10 mg daily by mouth. 90 tablet 3  . diphenoxylate-atropine (LOMOTIL) 2.5-0.025 MG tablet TAKE 1 TABLET BY MOUTH 4 TIMES DAILY AS NEEDED FOR DIARRHEA OR  LOOSE  STOOLS 120 tablet 3  . furosemide (LASIX) 40 MG tablet Take 1 tablet (40 mg total) by mouth daily. 30 tablet 3  . gabapentin (NEURONTIN) 300 MG capsule One tab PO qHS for a week, then BID for a week, then TID. May double weekly to a max of 3,600mg /day 180 capsule 0  . Insulin Glargine (TOUJEO MAX SOLOSTAR) 300 UNIT/ML SOPN Inject 12 Units into the skin daily.    . Insulin Pen Needle (ADVOCATE INSULIN PEN NEEDLES) 33G X 4 MM MISC 1 each by Does not apply route 2 (two) times daily. 100 each 12  . l-methylfolate-B6-B12 (METANX) 3-35-2 MG TABS tablet Take 1 tablet by mouth daily. 30 tablet 5  . lactase (LACTAID) 3000 units tablet Take 1 tablet (3,000 Units total) by mouth 3 (three)  times daily as needed. 120 tablet 3  . levocetirizine (XYZAL) 5 MG tablet Take 1 tablet (5 mg total) by mouth every evening. 90 tablet 4  . metoprolol succinate (TOPROL-XL) 50 MG 24 hr tablet Take 50 mg by mouth daily.    . nitroGLYCERIN (NITRODUR - DOSED IN MG/24 HR) 0.2 mg/hr patch 1/8 to 1/4 patch to both elbows daily for tendonitis 30 patch 1  . omeprazole (PRILOSEC) 40 MG capsule Take 1 capsule (40 mg total) by mouth daily. 90 capsule 3  . oxyCODONE-acetaminophen (PERCOCET) 10-325 MG tablet Take by mouth.    . sitaGLIPtin-metformin (JANUMET) 50-1000 MG tablet Take 1 tablet 2 (two) times daily with a meal by mouth. 180 tablet 3   No current facility-administered medications for this visit.    Allergies  Allergen Reactions  . Bee Venom Anaphylaxis  . Pregabalin Other (See Comments)    expensive expensive   . Penicillins   . Terramycin [Oxytetracycline]      Discussed warning signs or symptoms. Please see discharge instructions. Patient expresses understanding.   Berlie, Hatchel  1961/02/09 MRN: [id number] Procedure:Left heart  catheterization 04/14/18 Indication:Cardiomyopathy, new onset  Description of procedure: The right wrist was prepped and draped in a sterile fashion and the radial artery access site was anesthetized with lidocaine. Using Seldinger technique a glide sheath was placed. Verapamil was given through the sheath SideArm  port prevent artery spasm. Additionally, the patient was anticoagulated with unfractionated heparin during the case. At the end of the case a TR band was placed on the right wrist after removal of the sheath for hemostasis.  Findings:  Left main:Medium caliber without significant disease. CVE:LFYBOF caliber vessel. Proximally free of disease. Midportion with 40% disease. Apical LAD with 70-80% disease. High diagonal one medium-size without disease. Diagonals 2 and 3 small with diffuse 60-70% disease. Circumflex:Medium-size nondominant vessel. Proximally luminal regularities. One major obtuse marginal with long 50% disease. BPZ:WCHEN and dominant vessel. Proximally 30% disease. Midportion with 40% disease. Right PDA without significant disease. Right PLV branches medium-size with luminal regularities. Hemodynamics:Aortic pressure 86/61 with a mean of 71. Left ventricular pressure 90 with and EDP of 9  Estimated blood loss:  Approximately 10 cc's.  No tissue specimens were obtained or collected during this procedure.  Under my direct supervision 2 milligrams of versed and 100 micrograms of Fentanyl were administered intravenously for moderate sedation.  Pulse oximetry, heart rate, and blood pressure were continuously monitored by an independent trained observer who  was present throughout the case.  The procedure start time was 7:53 AM and the end time was 8:10 AM.  Conclusions: 1.Moderate/distal vessel coronary artery disease. 2. Normal intracardiac filling pressures 3. Right radial access. 4. Recommendations are made for medical management in this patient with an apparent  nonischemic cardiomyopathy.  Electronically Signed by: Theodoro Kalata  Echocardiogram 2D Complete    :Name: [name] Study Date: 04/11/2018 09:10   Interpretation Summary A complete portable two-dimensional transthoracic echocardiogram with color flow Doppler and Spectral Doppler was performed. The study was technically adequate. The left ventricle is mildly dilated. There is mild concentric left ventricular hypertrophy. There is severe diffuse hypokinesis of the left ventricle. The left ventricular ejection fraction is markedly reduced (25-30%). Unable to adequately determine diastolic dysfunction. The left atrium is mildly dilated. There is moderate (2+) tricuspid regurgitation. Right ventricular systolic pressure is normal. The aortic valve is trileaflet. Mild aortic sclerosis is present with good valvular opening. There is a small pericardial effusion without echocardiographic evidence  of tamponade.  Left Ventricle The left ventricle is mildly dilated. There is mild concentric left ventricular hypertrophy. There is severe diffuse hypokinesis of the left ventricle. The left ventricular ejection fraction is markedly reduced (25- 30%). Unable to adequately determine diastolic dysfunction.   Right Ventricle The right ventricle is normal in structure and function.  Atria The left atrium is mildly dilated. The right atrium is normal.  Mitral Valve The mitral valve is mildly thickened with mild [1+] regurgitation.   Tricuspid Valve The tricuspid valve leaflets are thin and pliable and the valve motion is normal. There is moderate (2+) tricuspid regurgitation. Right ventricular systolic pressure is normal.  Aortic Valve The aortic valve is trileaflet. Mild aortic sclerosis is present with good valvular opening.  Pulmonic Valve The pulmonic valve leaflets are thin and pliable; valve motion is normal. There is trace pulmonic regurgitation.  Vessels The aortic root  is normal in diameter.  Pericardium There is a small pericardial effusion without echocardiographic evidence of tamponade.  MMode/2D Measurements & Calculations IVSd: 1.3 cm                                LVIDd: 5.4 cm                                             LVIDs: 4.7 cm                                             LVPWd: 1.3 cm          _____________________________________________________________ LV mass(C)d: 281.4 grams                    Ao root diam: 3.3 cm  LV mass(C)dI: 135.4 grams/m2                Ao root area: 8.5 cm2                                             LA dimension: 4.6 cm         _____________________________________________________________  LAV (MOD-bp) Index: 41.4 ml/m2              LAV(MOD-bp): 86.1 ml  Doppler Measurements & Calculations TR max vel: 199.5 cm/sec TR max PG: 15.9 mmHg   ____________________________________________________________________________                           Lamar Blinks, M.D.,F.A.C.C.   on   04/11/2018 Electronically signed by:02:18 PM Ordering Physician: [phone]^[id number]^^^^^EPIC  Care Everywhere Result Report Lipid PanelResulted: 04/19/2018 7:37 AM [facility] Component Name Value Ref Range  Cholesterol, Total 184 100 - 199 mg/dL  Triglycerides 178 (H) 0 - 149 mg/dL  HDL 35 (L) >39 mg/dL  VLDL Cholesterol Cal 36 5 - 40 mg/dL  LDL 113 (H) 0 - 99 mg/dL  Specimen Collected on  Blood 04/18/2018 4:19 PM  Result Narrative  Performed at:  [facility] [address], [place], [place]  [id number] Lab Director: Rush Farmer MD, Phone:  [phone]   Care Everywhere Result Report Basic Metabolic PanelResulted: 4/96/7591 4:13 AM [facility] Component Name Value Ref Range  Na 137 136 - 146 mmol/L  Potassium 4.4 3.7 - 5.4 mmol/L  Cl 101 97 - 108 mmol/L  CO2 26 20 - 32 mmol/L  Glucose 196 (H) 65 - 99 mg/dL  BUN 24 6 - 24 mg/dL  Creatinine 1.27 0.76 - 1.27 mg/dL  Ca 8.5 (L) 8.7 - 10.2 mg/dL    BUN/CREAT RATIO 18.9 11 - 26   GFR AFRICAN AMERICAN 72  Comment: African-American:  Normal GFR (glomerular filtration rate) > 60 mL/min/1.73 meters squared. < 60 may include impaired kidney function based on creatinine, age, legal sex, and race normalized to accepted average body surface area mL/min/1.79m2  GFR Non African American 62  Comment: Non African American:  Normal GFR (glomerular filtration rate) > 60 mL/min/1.73 meters squared. < 60 may include impaired kidney function based on creatinine, age, legal sex, and race normalized to accepted average body surface area. mL/min/1.45m2  AGAP 10 7 - 16 mmol/L  Specimen Collected on  Blood 04/12/2018 3:44 AM   Care Everywhere Result Report Hemoglobin A1cResulted: 04/12/2018 4:11 AM [facility] Component Name Value Ref Range  Hemoglobin A1c 6.3 (H) 4.8 - 5.6 %  Specimen Collected on  Blood 04/12/2018 3:44 AM  Result Narrative  Reference Interval:                  4.8 - 5.6% Increased Risk for Diabetes:         5.7 - 6.4% Diabetes:                             >=6.5% Glycemic Control for Adults with Diabetes:                        <7.0%   Care Everywhere Result Report NT-proBNPResulted: 04/11/2018 3:29 AM [facility] Component Name Value Ref Range  NT-ProBNP 3,222 (H)  Comment: Among patients with dyspnea, NT-proBNPis highly sensitive for the detection of acute congestive heart failure.  In addition, a NT-proBNP <300 pg/mL effectively rules out acute congestive heart failure, with 98% negative predictive value. <=899 pg/mL  Specimen Collected on  Blood 04/11/2018 2:56 AM

## 2018-04-26 ENCOUNTER — Encounter: Payer: Self-pay | Admitting: Family Medicine

## 2018-04-27 MED ORDER — EMPAGLIFLOZIN 25 MG PO TABS: 25.0000 mg | ORAL_TABLET | Freq: Every day | ORAL | 0 refills | Status: DC

## 2018-05-05 ENCOUNTER — Telehealth: Payer: Self-pay | Admitting: Family Medicine

## 2018-05-05 NOTE — Telephone Encounter (Signed)
Completed updated Christella Scheuermann form for short-term disability.

## 2018-05-09 ENCOUNTER — Telehealth: Payer: Self-pay | Admitting: Family Medicine

## 2018-05-09 DIAGNOSIS — R5383 Other fatigue: Secondary | ICD-10-CM

## 2018-05-09 NOTE — Telephone Encounter (Signed)
Switching sleep study to home sleep study per insurance.

## 2018-05-11 DIAGNOSIS — I2510 Atherosclerotic heart disease of native coronary artery without angina pectoris: Secondary | ICD-10-CM | POA: Diagnosis not present

## 2018-05-11 DIAGNOSIS — Z794 Long term (current) use of insulin: Secondary | ICD-10-CM | POA: Diagnosis not present

## 2018-05-11 DIAGNOSIS — I5022 Chronic systolic (congestive) heart failure: Secondary | ICD-10-CM | POA: Diagnosis not present

## 2018-05-11 DIAGNOSIS — I428 Other cardiomyopathies: Secondary | ICD-10-CM | POA: Diagnosis not present

## 2018-05-11 DIAGNOSIS — E119 Type 2 diabetes mellitus without complications: Secondary | ICD-10-CM | POA: Diagnosis not present

## 2018-05-12 ENCOUNTER — Encounter: Payer: Self-pay | Admitting: Family Medicine

## 2018-05-12 LAB — NT-PROBNP
CO2: 26
Ca Carb Crys, UA: 8.5
Chloride: 101
EGFR (Non-African Amer.): 62
NT-PROBNP: 3222
VLDL Cholesterol Cal: 36

## 2018-05-19 ENCOUNTER — Encounter: Payer: Self-pay | Admitting: Family Medicine

## 2018-05-19 ENCOUNTER — Ambulatory Visit (INDEPENDENT_AMBULATORY_CARE_PROVIDER_SITE_OTHER): Payer: BLUE CROSS/BLUE SHIELD | Admitting: Family Medicine

## 2018-05-19 VITALS — BP 121/74 | HR 86 | Wt 184.0 lb

## 2018-05-19 DIAGNOSIS — I5021 Acute systolic (congestive) heart failure: Secondary | ICD-10-CM

## 2018-05-19 DIAGNOSIS — Z794 Long term (current) use of insulin: Secondary | ICD-10-CM

## 2018-05-19 DIAGNOSIS — I10 Essential (primary) hypertension: Secondary | ICD-10-CM | POA: Diagnosis not present

## 2018-05-19 DIAGNOSIS — I428 Other cardiomyopathies: Secondary | ICD-10-CM

## 2018-05-19 DIAGNOSIS — I5022 Chronic systolic (congestive) heart failure: Secondary | ICD-10-CM

## 2018-05-19 DIAGNOSIS — I2510 Atherosclerotic heart disease of native coronary artery without angina pectoris: Secondary | ICD-10-CM

## 2018-05-19 DIAGNOSIS — E1140 Type 2 diabetes mellitus with diabetic neuropathy, unspecified: Secondary | ICD-10-CM

## 2018-05-19 DIAGNOSIS — F332 Major depressive disorder, recurrent severe without psychotic features: Secondary | ICD-10-CM

## 2018-05-19 LAB — BASIC METABOLIC PANEL WITH GFR
BUN / CREAT RATIO: 33 (calc) — AB (ref 6–22)
BUN: 35 mg/dL — ABNORMAL HIGH (ref 7–25)
CHLORIDE: 99 mmol/L (ref 98–110)
CO2: 25 mmol/L (ref 20–32)
Calcium: 9.6 mg/dL (ref 8.6–10.3)
Creat: 1.07 mg/dL (ref 0.70–1.33)
GFR, EST AFRICAN AMERICAN: 89 mL/min/{1.73_m2} (ref >=60)
GFR, Est Non African American: 77 mL/min/{1.73_m2} (ref >=60)
Glucose, Bld: 296 mg/dL — ABNORMAL HIGH (ref 65–99)
Potassium: 4.3 mmol/L (ref 3.5–5.3)
SODIUM: 133 mmol/L — AB (ref 135–146)

## 2018-05-19 LAB — LIPID PANEL W/REFLEX DIRECT LDL
CHOL/HDL RATIO: 5.1 (calc) — AB (ref <5.0)
Cholesterol: 183 mg/dL (ref <200)
HDL: 36 mg/dL — ABNORMAL LOW (ref >40)
LDL Cholesterol (Calc): 103 mg/dL (calc) — ABNORMAL HIGH
Non-HDL Cholesterol (Calc): 147 mg/dL (calc) — ABNORMAL HIGH (ref <130)
Triglycerides: 333 mg/dL — ABNORMAL HIGH (ref <150)

## 2018-05-19 MED ORDER — NITROGLYCERIN 0.4 MG SL SUBL: 0.4000 mg | SUBLINGUAL_TABLET | SUBLINGUAL | 3 refills | Status: DC | PRN

## 2018-05-19 MED ORDER — INSULIN GLARGINE 300 UNIT/ML ~~LOC~~ SOPN: 40.0000 [IU] | PEN_INJECTOR | Freq: Every day | SUBCUTANEOUS | 3 refills | Status: DC

## 2018-05-19 NOTE — Patient Instructions (Addendum)
Thank you for coming in today.  Restart wellbutrin.  Get labs now.   I will send results to your cardiologist.   Recheck with me in 1 month.

## 2018-05-19 NOTE — Progress Notes (Signed)
Seth Weeks is a 57 y.o. male who presents to [place]: [place] today for follow-up heart failure and diabetes and depression.  Seth Weeks was discharged from the hospital in the middle September for new diagnosis of acute systolic heart failure.  He is at home now and recovering.  He still feels fatigued and notes exertional dyspnea.  He is been seen by his cardiologist and was recently started on Entresto and Corlanor.  He continues his other medications as well.  He denies chest pain or palpitations.  He does not have a prescription for sublingual nitroglycerin.   Additionally he has diabetes which has not been well controlled recently.  He notes his blood sugars are typically in the 240s.  He takes medications listed below.  No polyuria or polydipsia.  No hypoglycemic episodes.  Mood: Seth Weeks notes that his depression symptoms have been worsening recently.  He ran out of his Wellbutrin and just was able to get it from the pharmacy recently.  Is not yet restarted it.   ROS as above:  Exam:  BP 121/74   Pulse 86   Wt 184 lb (83.5 kg)   BMI 27.57 kg/m  Wt Readings from Last 5 Encounters:  05/19/18 184 lb (83.5 kg)  04/21/18 190 lb (86.2 kg)  04/10/18 208 lb (94.3 kg)  03/28/18 205 lb (93 kg)  03/17/18 207 lb (93.9 kg)    Gen: Well NAD HEENT: EOMI,  MMM Lungs: Normal work of breathing. CTABL Heart: RRR no MRG Abd: NABS, Soft. Nondistended, Nontender Exts: Brisk capillary refill, warm and well perfused.  No significant edema Psych alert and oriented slightly depressed affect.  No SI or HI.     Assessment and Plan: 57 y.o. male with  Heart failure: Slowly improving.  Not quite ready return to work today due to exertional demands of work.  Will reassess in 1 month.  Extend work note until after next appointment in about 1 month.  Check metabolic panel and lipid panel  again.  Diabetes: Not well controlled.  Continue to titrate Toujeo.  Recheck in 1 month.  Mood: Worsening.  Patient has run out of his Wellbutrin.  Plan to restart Wellbutrin and reassess in 1 month.  Suicidal precautions reviewed.   Orders Placed This Encounter  Procedures  . BASIC METABOLIC PANEL WITH GFR  . Lipid Panel w/reflex Direct LDL   Meds ordered this encounter  Medications  . nitroGLYCERIN (NITROSTAT) 0.4 MG SL tablet    Sig: Place 1 tablet (0.4 mg total) under the tongue every 5 (five) minutes as needed for chest pain.    Dispense:  50 tablet    Refill:  3  . Insulin Glargine (TOUJEO MAX SOLOSTAR) 300 UNIT/ML SOPN    Sig: Inject 40 Units into the skin daily.    Dispense:  9 pen    Refill:  3    Please consider 90 day supplies to promote better adherence     Historical information moved to improve visibility of documentation.  Past Medical History:  Diagnosis Date  . CHF (congestive heart failure) ([place]) 04/11/2018  . Diabetes mellitus ([place])   . GAD (generalized anxiety disorder) 07/17/2015  . High triglycerides   . Hypertension   . MDD (major depressive disorder) 03/17/2018   Past Surgical History:  Procedure Laterality Date  . TONSILLECTOMY     Social History   Tobacco Use  . Smoking status: Never Smoker  . Smokeless tobacco:  Never Used  Substance Use Topics  . Alcohol use: Yes   family history includes Heart failure in his mother.  Medications: Current Outpatient Medications  Medication Sig Dispense Refill  . ALPRAZolam (XANAX) 2 MG tablet Take 1 tablet (2 mg total) by mouth 3 (three) times daily as needed for anxiety. 90 tablet 1  . AMBULATORY NON FORMULARY MEDICATION Single glucometer of choice with lancets, test strips. Test 3x daily.  E11.65 Disp QS 1 months 1 each 0  . AMBULATORY NON FORMULARY MEDICATION One year of exercise at a gym for his overall health and medical conditions/strength/endurance training. 100 Device 11  . aspirin EC 81 MG  tablet Take by mouth.    [name] atorvastatin (LIPITOR) 40 MG tablet Take by mouth.    [name] buPROPion (WELLBUTRIN XL) 150 MG 24 hr tablet Take 1 tablet (150 mg total) by mouth every morning. 30 tablet 1  . Continuous Blood Gluc Receiver (FREESTYLE LIBRE 14 DAY READER) DEVI 1 Units by Does not apply route every 14 (fourteen) days. Use to check blood sugar daily, pairs with Freestyle Libra sensors. Please include sensors with device. Change sensor every 14 days. Dx: E11.40. 1 Device 0  . Continuous Blood Gluc Sensor (FREESTYLE LIBRE 14 DAY SENSOR) MISC 1 Units by Does not apply route every 14 (fourteen) days. Use to check blood sugar daily, change sensor every 14 days. Dx: E11.40. 2 each 3  . dapagliflozin propanediol (FARXIGA) 10 MG TABS tablet Take 10 mg daily by mouth. 90 tablet 3  . diphenoxylate-atropine (LOMOTIL) 2.5-0.025 MG tablet TAKE 1 TABLET BY MOUTH 4 TIMES DAILY AS NEEDED FOR DIARRHEA OR  LOOSE  STOOLS 120 tablet 3  . furosemide (LASIX) 40 MG tablet Take 1 tablet (40 mg total) by mouth daily. 30 tablet 3  . gabapentin (NEURONTIN) 300 MG capsule One tab PO qHS for a week, then BID for a week, then TID. May double weekly to a max of 3,600mg /day 180 capsule 0  . Insulin Glargine (TOUJEO MAX SOLOSTAR) 300 UNIT/ML SOPN Inject 40 Units into the skin daily. 9 pen 3  . Insulin Pen Needle (ADVOCATE INSULIN PEN NEEDLES) 33G X 4 MM MISC 1 each by Does not apply route 2 (two) times daily. 100 each 12  . ivabradine (CORLANOR) 5 MG TABS tablet Take 5 mg by mouth 2 (two) times daily.    [name] l-methylfolate-B6-B12 (METANX) 3-35-2 MG TABS tablet Take 1 tablet by mouth daily. 30 tablet 5  . lactase (LACTAID) 3000 units tablet Take 1 tablet (3,000 Units total) by mouth 3 (three) times daily as needed. 120 tablet 3  . metoprolol succinate (TOPROL-XL) 50 MG 24 hr tablet Take 50 mg by mouth daily.    [name] omeprazole (PRILOSEC) 40 MG capsule Take 1 capsule (40 mg total) by mouth daily. 90 capsule 3  . oxyCODONE-acetaminophen  (PERCOCET) 10-325 MG tablet Take by mouth.    . sacubitril-valsartan (ENTRESTO) 24-26 MG Take 1 tablet by mouth 2 (two) times daily.    . sitaGLIPtin-metformin (JANUMET) 50-1000 MG tablet Take 1 tablet 2 (two) times daily with a meal by mouth. 180 tablet 3  . nitroGLYCERIN (NITROSTAT) 0.4 MG SL tablet Place 1 tablet (0.4 mg total) under the tongue every 5 (five) minutes as needed for chest pain. 50 tablet 3   No current facility-administered medications for this visit.    Allergies  Allergen Reactions  . Bee Venom Anaphylaxis  . Pregabalin Other (See Comments)    expensive expensive   .  Penicillins   . Terramycin [Oxytetracycline]      Discussed warning signs or symptoms. Please see discharge instructions. Patient expresses understanding.

## 2018-05-21 ENCOUNTER — Other Ambulatory Visit: Payer: Self-pay | Admitting: Family Medicine

## 2018-05-21 DIAGNOSIS — F419 Anxiety disorder, unspecified: Principal | ICD-10-CM

## 2018-05-21 DIAGNOSIS — F32A Depression, unspecified: Secondary | ICD-10-CM

## 2018-05-21 DIAGNOSIS — F329 Major depressive disorder, single episode, unspecified: Secondary | ICD-10-CM

## 2018-05-26 DIAGNOSIS — E1142 Type 2 diabetes mellitus with diabetic polyneuropathy: Secondary | ICD-10-CM | POA: Diagnosis not present

## 2018-05-26 DIAGNOSIS — E1141 Type 2 diabetes mellitus with diabetic mononeuropathy: Secondary | ICD-10-CM | POA: Diagnosis not present

## 2018-05-26 DIAGNOSIS — M47812 Spondylosis without myelopathy or radiculopathy, cervical region: Secondary | ICD-10-CM | POA: Diagnosis not present

## 2018-05-26 DIAGNOSIS — Z79899 Other long term (current) drug therapy: Secondary | ICD-10-CM | POA: Diagnosis not present

## 2018-05-26 DIAGNOSIS — G894 Chronic pain syndrome: Secondary | ICD-10-CM | POA: Diagnosis not present

## 2018-05-26 DIAGNOSIS — E1165 Type 2 diabetes mellitus with hyperglycemia: Secondary | ICD-10-CM | POA: Diagnosis not present

## 2018-05-26 DIAGNOSIS — Z5181 Encounter for therapeutic drug level monitoring: Secondary | ICD-10-CM | POA: Diagnosis not present

## 2018-06-04 ENCOUNTER — Encounter: Payer: Self-pay | Admitting: Family Medicine

## 2018-06-04 DIAGNOSIS — B070 Plantar wart: Secondary | ICD-10-CM

## 2018-06-13 ENCOUNTER — Other Ambulatory Visit: Payer: Self-pay | Admitting: Family Medicine

## 2018-06-13 ENCOUNTER — Other Ambulatory Visit: Payer: Self-pay

## 2018-06-13 DIAGNOSIS — G6289 Other specified polyneuropathies: Secondary | ICD-10-CM

## 2018-06-13 DIAGNOSIS — Q828 Other specified congenital malformations of skin: Secondary | ICD-10-CM | POA: Diagnosis not present

## 2018-06-13 DIAGNOSIS — M79671 Pain in right foot: Secondary | ICD-10-CM | POA: Diagnosis not present

## 2018-06-13 MED ORDER — GABAPENTIN 300 MG PO CAPS: ORAL_CAPSULE | ORAL | 0 refills | Status: DC

## 2018-06-14 DIAGNOSIS — I5022 Chronic systolic (congestive) heart failure: Secondary | ICD-10-CM | POA: Diagnosis not present

## 2018-06-14 DIAGNOSIS — I428 Other cardiomyopathies: Secondary | ICD-10-CM | POA: Diagnosis not present

## 2018-06-15 DIAGNOSIS — I428 Other cardiomyopathies: Secondary | ICD-10-CM | POA: Diagnosis not present

## 2018-06-15 DIAGNOSIS — I5022 Chronic systolic (congestive) heart failure: Secondary | ICD-10-CM | POA: Diagnosis not present

## 2018-06-15 DIAGNOSIS — I2510 Atherosclerotic heart disease of native coronary artery without angina pectoris: Secondary | ICD-10-CM | POA: Diagnosis not present

## 2018-06-16 ENCOUNTER — Ambulatory Visit (INDEPENDENT_AMBULATORY_CARE_PROVIDER_SITE_OTHER): Payer: BLUE CROSS/BLUE SHIELD | Admitting: Family Medicine

## 2018-06-16 VITALS — BP 134/78 | HR 76 | Wt 203.0 lb

## 2018-06-16 DIAGNOSIS — Z794 Long term (current) use of insulin: Secondary | ICD-10-CM

## 2018-06-16 DIAGNOSIS — E1140 Type 2 diabetes mellitus with diabetic neuropathy, unspecified: Secondary | ICD-10-CM | POA: Diagnosis not present

## 2018-06-16 DIAGNOSIS — I5022 Chronic systolic (congestive) heart failure: Secondary | ICD-10-CM

## 2018-06-16 DIAGNOSIS — R5383 Other fatigue: Secondary | ICD-10-CM

## 2018-06-16 DIAGNOSIS — I10 Essential (primary) hypertension: Secondary | ICD-10-CM

## 2018-06-16 MED ORDER — LEVOCETIRIZINE DIHYDROCHLORIDE 5 MG PO TABS: 5.0000 mg | ORAL_TABLET | Freq: Every evening | ORAL | 1 refills | Status: DC

## 2018-06-16 MED ORDER — LOPERAMIDE HCL 2 MG PO TABS: 2.0000 mg | ORAL_TABLET | Freq: Four times a day (QID) | ORAL | 1 refills | Status: DC | PRN

## 2018-06-16 MED ORDER — BUPROPION HCL ER (XL) 150 MG PO TB24: 150.0000 mg | ORAL_TABLET | Freq: Every day | ORAL | 1 refills | Status: DC

## 2018-06-16 NOTE — Patient Instructions (Addendum)
Thank you for coming in today. Take metoprolol at bedtime.  Take Entresto at bedtime.  Atorvistatin at bedtime is good too.    Do not take farxiga or Furosemide at bedtime because they will make you pee.   Work on exertion ability with plan to return to work next month.   Continue wellbutrin.

## 2018-06-16 NOTE — Progress Notes (Signed)
Seth Weeks is a 57 y.o. male who presents to [facility] today for follow-up diabetes heart failure and fatigue.  Seth Weeks he notes that his diabetes is improving.  He is currently taking about 40 units to 45 units of glargine insulin daily.  He notes his blood sugars reasonably well controlled denies any hyper or hypoglycemic episodes.  Heart failure: Managed with cardiology.  Taking medications listed below.  Notes that he still has significant exertional fatigue.  He is difficult household tasks excluding heavy pushing and lifting.  He quickly tires.  He is slowly improving but notes that he continues to be fatigued even with light exercise.     ROS as above:  Exam:  BP 134/78   Pulse 76   Wt 203 lb (92.1 kg)   BMI 30.42 kg/m  Wt Readings from Last 5 Encounters:  06/16/18 203 lb (92.1 kg)  05/19/18 184 lb (83.5 kg)  04/21/18 190 lb (86.2 kg)  04/10/18 208 lb (94.3 kg)  03/28/18 205 lb (93 kg)    Gen: Well NAD HEENT: EOMI,  MMM Lungs: Normal work of breathing. CTABL Heart: RRR no MRG Abd: NABS, Soft. Nondistended, Nontender Exts: Brisk capillary refill, warm and well perfused.  No edema  Lab and Radiology Results No results found for this or any previous visit (from the past 72 hour(s)). No results found.    Assessment and Plan: 57 y.o. male with  Diabetes reasonably well-controlled continue current regimen and recheck in 1 month.  Heart failure: Improving slowly.  Continue current regimen.  Fatigue likely due to heart failure: Slowly improving.  Work note written.  Return to work delayed for 1 month.  Reassess in 1 month.   No orders of the defined types were placed in this encounter.  Meds ordered this encounter  Medications  . levocetirizine (XYZAL) 5 MG tablet    Sig: Take 1 tablet (5 mg total) by mouth every evening.    Dispense:  90  tablet    Refill:  1  . loperamide (IMODIUM A-D) 2 MG tablet    Sig: Take 1 tablet (2 mg total) by mouth 4 (four) times daily as needed for diarrhea or loose stools.    Dispense:  90 tablet    Refill:  1  . buPROPion (WELLBUTRIN XL) 150 MG 24 hr tablet    Sig: Take 1 tablet (150 mg total) by mouth daily.    Dispense:  90 tablet    Refill:  1    Please consider 90 day supplies to promote better adherence     Historical information moved to improve visibility of documentation.  Past Medical History:  Diagnosis Date  . CHF (congestive heart failure) ([place]) 04/11/2018  . Diabetes mellitus ([place])   . GAD (generalized anxiety disorder) 07/17/2015  . High triglycerides   . Hypertension   . MDD (major depressive disorder) 03/17/2018   Past Surgical History:  Procedure Laterality Date  . TONSILLECTOMY     Social History   Tobacco Use  . Smoking status: Never Smoker  . Smokeless tobacco: Never Used  Substance Use Topics  . Alcohol use: Yes   family history includes Heart failure in his mother.  Medications: Current Outpatient Medications  Medication Sig Dispense Refill  . alprazolam (XANAX) 2 MG tablet TAKE 1 TABLET BY MOUTH THREE TIMES DAILY AS NEEDED FOR ANXIETY 90 tablet 1  . AMBULATORY NON FORMULARY MEDICATION Single glucometer of choice  with lancets, test strips. Test 3x daily.  E11.65 Disp QS 1 months 1 each 0  . AMBULATORY NON FORMULARY MEDICATION One year of exercise at a gym for his overall health and medical conditions/strength/endurance training. 100 Device 11  . aspirin EC 81 MG tablet Take by mouth.    [name] atorvastatin (LIPITOR) 40 MG tablet Take by mouth.    [name] buPROPion (WELLBUTRIN XL) 150 MG 24 hr tablet Take 1 tablet (150 mg total) by mouth daily. 90 tablet 1  . Continuous Blood Gluc Receiver (FREESTYLE LIBRE 14 DAY READER) DEVI 1 Units by Does not apply route every 14 (fourteen) days. Use to check blood sugar daily, pairs with Freestyle Libra sensors. Please include  sensors with device. Change sensor every 14 days. Dx: E11.40. 1 Device 0  . Continuous Blood Gluc Sensor (FREESTYLE LIBRE 14 DAY SENSOR) MISC 1 Units by Does not apply route every 14 (fourteen) days. Use to check blood sugar daily, change sensor every 14 days. Dx: E11.40. 2 each 3  . dapagliflozin propanediol (FARXIGA) 10 MG TABS tablet Take 10 mg daily by mouth. 90 tablet 3  . diphenoxylate-atropine (LOMOTIL) 2.5-0.025 MG tablet TAKE 1 TABLET BY MOUTH 4 TIMES DAILY AS NEEDED FOR DIARRHEA OR  LOOSE  STOOLS 120 tablet 3  . furosemide (LASIX) 40 MG tablet Take 1 tablet (40 mg total) by mouth daily. 30 tablet 3  . gabapentin (NEURONTIN) 300 MG capsule One tab PO qHS for a week, then BID for a week, then TID. May double weekly to a max of 3,600mg /day 180 capsule 0  . Insulin Glargine (TOUJEO MAX SOLOSTAR) 300 UNIT/ML SOPN Inject 40 Units into the skin daily. 9 pen 3  . Insulin Pen Needle (ADVOCATE INSULIN PEN NEEDLES) 33G X 4 MM MISC 1 each by Does not apply route 2 (two) times daily. 100 each 12  . ivabradine (CORLANOR) 5 MG TABS tablet Take 5 mg by mouth 2 (two) times daily.    [name] l-methylfolate-B6-B12 (METANX) 3-35-2 MG TABS tablet Take 1 tablet by mouth daily. 30 tablet 5  . lactase (LACTAID) 3000 units tablet Take 1 tablet (3,000 Units total) by mouth 3 (three) times daily as needed. 120 tablet 3  . metoprolol succinate (TOPROL-XL) 50 MG 24 hr tablet Take 50 mg by mouth daily.    . nitroGLYCERIN (NITROSTAT) 0.4 MG SL tablet Place 1 tablet (0.4 mg total) under the tongue every 5 (five) minutes as needed for chest pain. 50 tablet 3  . omeprazole (PRILOSEC) 40 MG capsule Take 1 capsule (40 mg total) by mouth daily. 90 capsule 3  . sacubitril-valsartan (ENTRESTO) 24-26 MG Take 1 tablet by mouth 2 (two) times daily.    . sitaGLIPtin-metformin (JANUMET) 50-1000 MG tablet Take 1 tablet 2 (two) times daily with a meal by mouth. 180 tablet 3  . levocetirizine (XYZAL) 5 MG tablet Take 1 tablet (5 mg total) by  mouth every evening. 90 tablet 1  . loperamide (IMODIUM A-D) 2 MG tablet Take 1 tablet (2 mg total) by mouth 4 (four) times daily as needed for diarrhea or loose stools. 90 tablet 1   No current facility-administered medications for this visit.    Allergies  Allergen Reactions  . Bee Venom Anaphylaxis  . Pregabalin Other (See Comments)    expensive expensive   . Penicillins   . Terramycin [Oxytetracycline]      Discussed warning signs or symptoms. Please see discharge instructions. Patient expresses understanding.

## 2018-06-22 ENCOUNTER — Other Ambulatory Visit: Payer: Self-pay | Admitting: Family Medicine

## 2018-06-23 ENCOUNTER — Encounter: Payer: Self-pay | Admitting: Family Medicine

## 2018-06-28 ENCOUNTER — Encounter: Payer: Self-pay | Admitting: Family Medicine

## 2018-06-28 ENCOUNTER — Telehealth: Payer: Self-pay

## 2018-06-28 NOTE — Telephone Encounter (Signed)
Patients paperwork is done and in the file. Attempted to call pt on both phone numbers, neither number worked.

## 2018-07-02 IMAGING — DX DG FINGER INDEX 2+V*R*
3 series · 3 of 3 positions shown · non-contrast
Comparison: None.

CLINICAL DATA: Right index finger pain with proximal swelling and
redness. No known injury.

EXAM:
RIGHT INDEX FINGER 2+V

[finger ap]
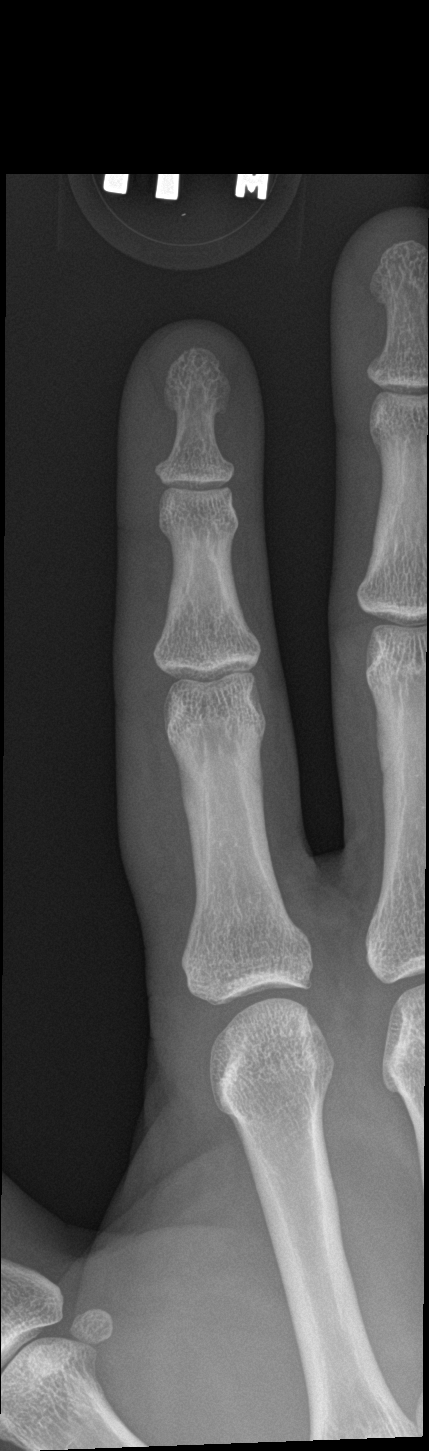

[finger obl]
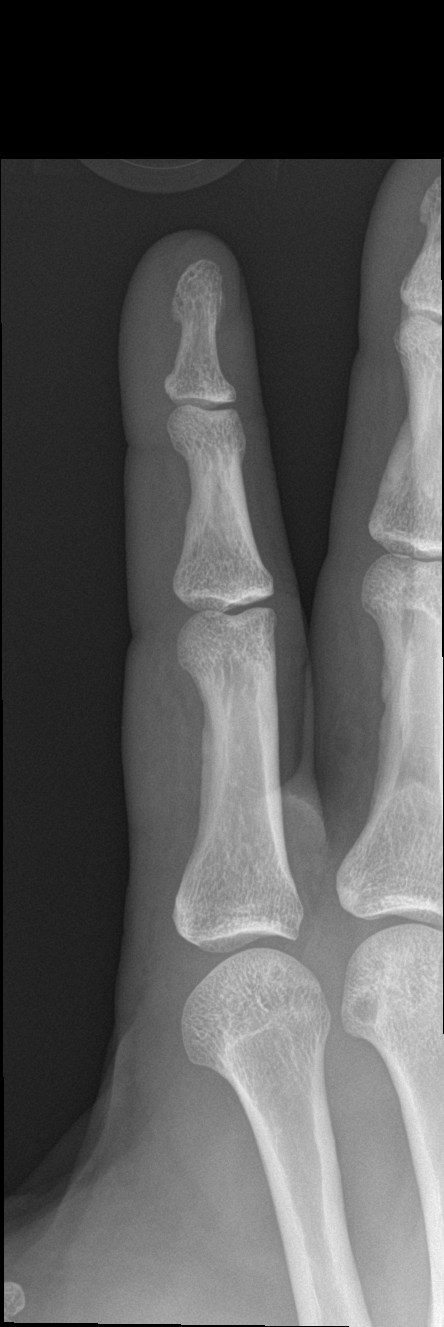

[finger lat]
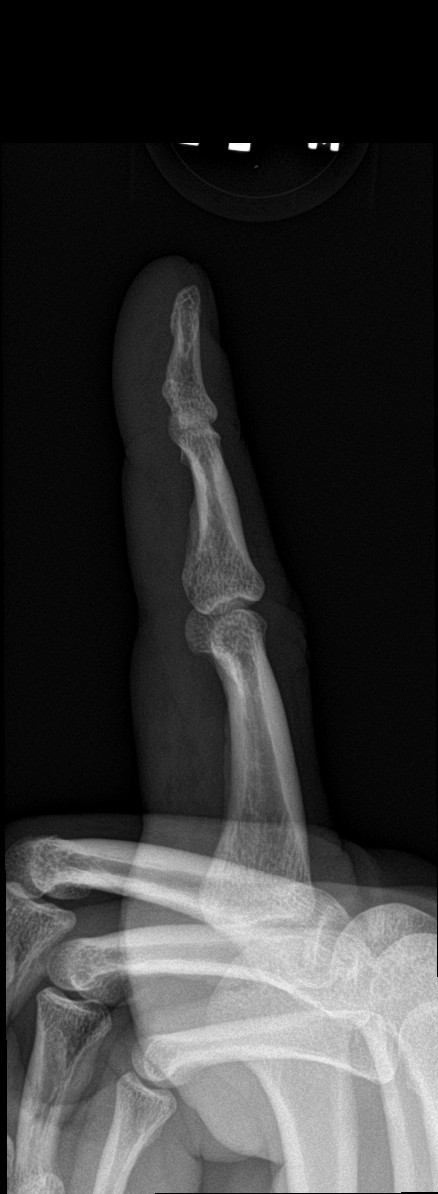

[3 of 3 positions shown; findings below may reference images not displayed]

FINDINGS: The joints are aligned. No acute or healing fracture or focal bony
abnormality. No soft tissue gas. The soft tissues of the proximal
second digit do appear more prominent than the visualized portion of
the soft tissues of the adjacent middle finger.
IMPRESSION: Probable soft tissue swelling, suggest clinical correlation. No
acute bony abnormality or soft tissue gas.

## 2018-07-06 ENCOUNTER — Other Ambulatory Visit: Payer: Self-pay

## 2018-07-06 DIAGNOSIS — G6289 Other specified polyneuropathies: Secondary | ICD-10-CM

## 2018-07-06 MED ORDER — GABAPENTIN 300 MG PO CAPS: ORAL_CAPSULE | ORAL | 3 refills | Status: DC

## 2018-07-14 ENCOUNTER — Encounter: Payer: Self-pay | Admitting: Family Medicine

## 2018-07-14 ENCOUNTER — Ambulatory Visit: Payer: Self-pay | Admitting: Family Medicine

## 2018-07-14 ENCOUNTER — Other Ambulatory Visit: Payer: Self-pay | Admitting: Family Medicine

## 2018-07-14 ENCOUNTER — Ambulatory Visit (INDEPENDENT_AMBULATORY_CARE_PROVIDER_SITE_OTHER): Payer: BLUE CROSS/BLUE SHIELD | Admitting: Family Medicine

## 2018-07-14 VITALS — BP 178/91 | HR 88 | Ht 68.5 in | Wt 200.0 lb

## 2018-07-14 DIAGNOSIS — F32A Depression, unspecified: Secondary | ICD-10-CM

## 2018-07-14 DIAGNOSIS — E1140 Type 2 diabetes mellitus with diabetic neuropathy, unspecified: Secondary | ICD-10-CM

## 2018-07-14 DIAGNOSIS — M79671 Pain in right foot: Secondary | ICD-10-CM | POA: Diagnosis not present

## 2018-07-14 DIAGNOSIS — I5022 Chronic systolic (congestive) heart failure: Secondary | ICD-10-CM

## 2018-07-14 DIAGNOSIS — Z794 Long term (current) use of insulin: Secondary | ICD-10-CM

## 2018-07-14 DIAGNOSIS — E1159 Type 2 diabetes mellitus with other circulatory complications: Secondary | ICD-10-CM

## 2018-07-14 DIAGNOSIS — F419 Anxiety disorder, unspecified: Principal | ICD-10-CM

## 2018-07-14 DIAGNOSIS — F329 Major depressive disorder, single episode, unspecified: Secondary | ICD-10-CM

## 2018-07-14 DIAGNOSIS — E162 Hypoglycemia, unspecified: Secondary | ICD-10-CM

## 2018-07-14 DIAGNOSIS — I110 Hypertensive heart disease with heart failure: Secondary | ICD-10-CM

## 2018-07-14 DIAGNOSIS — I2510 Atherosclerotic heart disease of native coronary artery without angina pectoris: Secondary | ICD-10-CM

## 2018-07-14 DIAGNOSIS — I10 Essential (primary) hypertension: Secondary | ICD-10-CM | POA: Diagnosis not present

## 2018-07-14 DIAGNOSIS — E1142 Type 2 diabetes mellitus with diabetic polyneuropathy: Secondary | ICD-10-CM | POA: Diagnosis not present

## 2018-07-14 DIAGNOSIS — G894 Chronic pain syndrome: Secondary | ICD-10-CM | POA: Diagnosis not present

## 2018-07-14 DIAGNOSIS — M47812 Spondylosis without myelopathy or radiculopathy, cervical region: Secondary | ICD-10-CM | POA: Diagnosis not present

## 2018-07-14 DIAGNOSIS — I428 Other cardiomyopathies: Secondary | ICD-10-CM

## 2018-07-14 DIAGNOSIS — I152 Hypertension secondary to endocrine disorders: Secondary | ICD-10-CM

## 2018-07-14 LAB — POCT HEMOGLOBIN: HEMOGLOBIN: 8.6 g/dL — AB (ref 11–14.6)

## 2018-07-14 MED ORDER — FREESTYLE LIBRE 14 DAY SENSOR MISC: 1.0000 [IU] | 3 refills | Status: DC

## 2018-07-14 MED ORDER — AMBULATORY NON FORMULARY MEDICATION: 0 refills | Status: DC

## 2018-07-14 MED ORDER — NARCAN 4 MG/0.1ML NA LIQD: 1.0000 | Freq: Once | NASAL | 0 refills | Status: AC

## 2018-07-14 MED ORDER — ALPRAZOLAM 2 MG PO TABS: 2.0000 mg | ORAL_TABLET | Freq: Three times a day (TID) | ORAL | 1 refills | Status: DC | PRN

## 2018-07-14 NOTE — Progress Notes (Signed)
Seth Weeks is a 57 y.o. male who presents to [facility] [employer] today for follow-up hypertension diabetes and heart failure  Diabetes: Patient notes that his fasting blood sugars typically around 90.  He notes his mealtime blood sugars are usually in the 250s.  He is taking about 55 units of insulin daily now.  Hypertension: Patient is his blood pressures typically much better controlled.  He takes medications listed below.  No chest pain or shortness of breath.  He notes his exertional capacity has improved.  Heart failure: Patient takes medications listed below.  He notes his exertional capacity has improved as noted above.  He is pretty happy with how things are going and thinks will be able to return to work as scheduled on December 23.   ROS as above:  Exam:  BP (!) 178/91   Pulse 88   Ht 5' 8.5" (1.74 m)   Wt 200 lb (90.7 kg)   BMI 29.97 kg/m  Wt Readings from Last 5 Encounters:  07/14/18 200 lb (90.7 kg)  06/16/18 203 lb (92.1 kg)  05/19/18 184 lb (83.5 kg)  04/21/18 190 lb (86.2 kg)  04/10/18 208 lb (94.3 kg)    Gen: Well NAD HEENT: EOMI,  MMM Lungs: Normal work of breathing. CTABL Heart: RRR no MRG Abd: NABS, Soft. Nondistended, Nontender Exts: Brisk capillary refill, warm and well perfused.   Lab and Radiology Results Results for orders placed or performed in visit on 07/14/18 (from the past 72 hour(s))  POCT hemoglobin     Status: Abnormal   Collection Time: 07/14/18  9:36 AM  Result Value Ref Range   Hemoglobin 8.6 (A) 11 - 14.6 g/dL   No results found.    Assessment and Plan: 57 y.o. male with  Diabetes: Less than good control.  Plan to continue insulin and Janumet listed below.  Continue Farxiga.  Work on reduced [employer].  Recheck in 1 month if not improved we will switch from Januvia to Trulicity type medications.  Hypertension: Not  controlled today but better control typically.  Recheck 1 month.  Continue current regimen.  Heart failure/CAD.: Doing reasonably well.  Improved exertional capacity.  Return to work as scheduled.  Refilled Narcan given history of chronic opiate use.   Orders Placed This Encounter  Procedures  . POCT hemoglobin   Meds ordered this encounter  Medications  . NARCAN 4 MG/0.1ML LIQD nasal spray kit    Sig: Place 1 spray into the nose once for 1 dose.    Dispense:  4 mg    Refill:  0  . AMBULATORY NON FORMULARY MEDICATION    Sig: Over the counter blood pressure machine.  Check daily HTN    Dispense:  1 each    Refill:  0  . alprazolam (XANAX) 2 MG tablet    Sig: Take 1 tablet (2 mg total) by mouth 3 (three) times daily as needed. for anxiety    Dispense:  90 tablet    Refill:  1  . Continuous Blood Gluc Sensor (FREESTYLE LIBRE 14 DAY SENSOR) MISC    Sig: 1 Units by Does not apply route every 14 (fourteen) days. Use to check blood sugar daily, change sensor every 14 days. Dx: E11.40.    Dispense:  2 each    Refill:  3   Patient researched [place] Controlled Substance Reporting System.   Historical information moved to improve visibility of documentation.  Past Medical History:  Diagnosis Date  . CHF (congestive heart failure) ([place]) 04/11/2018  . Diabetes mellitus ([place])   . GAD (generalized anxiety disorder) 07/17/2015  . High triglycerides   . Hypertension   . MDD (major depressive disorder) 03/17/2018   Past Surgical History:  Procedure Laterality Date  . TONSILLECTOMY     Social History   Tobacco Use  . Smoking status: Never Smoker  . Smokeless tobacco: Never Used  Substance Use Topics  . Alcohol use: Yes   family history includes Heart failure in his mother.  Medications: Current Outpatient Medications  Medication Sig Dispense Refill  . alprazolam (XANAX) 2 MG tablet Take 1 tablet (2 mg total) by mouth 3 (three) times daily as needed. for anxiety 90  tablet 1  . AMBULATORY NON FORMULARY MEDICATION Single glucometer of choice with lancets, test strips. Test 3x daily.  E11.65 Disp QS 1 months 1 each 0  . aspirin EC 81 MG tablet Take by mouth.    [name] atorvastatin (LIPITOR) 40 MG tablet Take by mouth.    [name] buPROPion (WELLBUTRIN XL) 150 MG 24 hr tablet Take 1 tablet (150 mg total) by mouth daily. 90 tablet 1  . Continuous Blood Gluc Receiver (FREESTYLE LIBRE 14 DAY READER) DEVI 1 Units by Does not apply route every 14 (fourteen) days. Use to check blood sugar daily, pairs with Freestyle Libra sensors. Please include sensors with device. Change sensor every 14 days. Dx: E11.40. 1 Device 0  . Continuous Blood Gluc Sensor (FREESTYLE LIBRE 14 DAY SENSOR) MISC 1 Units by Does not apply route every 14 (fourteen) days. Use to check blood sugar daily, change sensor every 14 days. Dx: E11.40. 2 each 3  . dapagliflozin propanediol (FARXIGA) 10 MG TABS tablet Take 10 mg daily by mouth. 90 tablet 3  . diphenoxylate-atropine (LOMOTIL) 2.5-0.025 MG tablet TAKE 1 TABLET BY MOUTH 4 TIMES DAILY AS NEEDED FOR DIARRHEA OR  LOOSE  STOOLS 120 tablet 3  . furosemide (LASIX) 40 MG tablet Take 1 tablet (40 mg total) by mouth daily. 30 tablet 3  . gabapentin (NEURONTIN) 300 MG capsule One tab PO qHS for a week, then BID for a week, then TID. May double weekly to a max of 3,681m/day 180 capsule 3  . Insulin Glargine (TOUJEO MAX SOLOSTAR) 300 UNIT/ML SOPN Inject 40 Units into the skin daily. 9 pen 3  . Insulin Pen Needle (ADVOCATE INSULIN PEN NEEDLES) 33G X 4 MM MISC 1 each by Does not apply route 2 (two) times daily. 100 each 12  . ivabradine (CORLANOR) 5 MG TABS tablet Take 5 mg by mouth 2 (two) times daily.    .Marland Kitchenl-methylfolate-B6-B12 (METANX) 3-35-2 MG TABS tablet Take 1 tablet by mouth daily. 30 tablet 5  . lactase (LACTAID) 3000 units tablet Take 1 tablet (3,000 Units total) by mouth 3 (three) times daily as needed. 120 tablet 3  . levocetirizine (XYZAL) 5 MG tablet Take  1 tablet (5 mg total) by mouth every evening. 90 tablet 1  . loperamide (IMODIUM A-D) 2 MG tablet Take 1 tablet (2 mg total) by mouth 4 (four) times daily as needed for diarrhea or loose stools. 90 tablet 1  . metoprolol succinate (TOPROL-XL) 50 MG 24 hr tablet Take 50 mg by mouth daily.    . nitroGLYCERIN (NITROSTAT) 0.4 MG SL tablet Place 1 tablet (0.4 mg total) under the tongue every 5 (five) minutes as needed for chest pain. 50 tablet 3  . omeprazole (PRILOSEC) 40  MG capsule TAKE 1 CAPSULE BY MOUTH ONCE DAILY 90 capsule 3  . oxyCODONE-acetaminophen (PERCOCET) 10-325 MG tablet Take 1 tablet by mouth every 8 (eight) hours as needed.    . sacubitril-valsartan (ENTRESTO) 24-26 MG Take 1 tablet by mouth 2 (two) times daily.    . sitaGLIPtin-metformin (JANUMET) 50-1000 MG tablet Take 1 tablet 2 (two) times daily with a meal by mouth. 180 tablet 3  . AMBULATORY NON FORMULARY MEDICATION Over the counter blood pressure machine.  Check daily HTN 1 each 0  . NARCAN 4 MG/0.1ML LIQD nasal spray kit Place 1 spray into the nose once for 1 dose. 4 mg 0   No current facility-administered medications for this visit.    Allergies  Allergen Reactions  . Bee Venom Anaphylaxis  . Pregabalin Other (See Comments)    expensive expensive   . Penicillins   . Terramycin [Oxytetracycline]      Discussed warning signs or symptoms. Please see discharge instructions. Patient expresses understanding.

## 2018-07-14 NOTE — Patient Instructions (Addendum)
Thank you for coming in today. Work on [employer].  Try to keep the carbs less than 60g per meal.  In 1 month if not controlled will likely try a medicine like Trulicity or Bydureon or Ozempic.   This will help with fasting blood sugar.       Dulaglutide injection What is this medicine? DULAGLUTIDE (DOO la GLOO tide) is used to improve blood sugar control in adults with type 2 diabetes. This medicine may be used with other oral diabetes medicines. This medicine may be used for other purposes; ask your health care provider or pharmacist if you have questions. COMMON BRAND NAME(S): TRULICITY What should I tell my health care provider before I take this medicine? They need to know if you have any of these conditions: -endocrine tumors (MEN 2) or if someone in your family had these tumors -history of pancreatitis -kidney disease -liver disease -stomach problems -thyroid cancer or if someone in your family had thyroid cancer -an unusual or allergic reaction to dulaglutide, other medicines, foods, dyes, or preservatives -pregnant or trying to get pregnant -breast-feeding How should I use this medicine? This medicine is for injection under the skin of your upper leg (thigh), stomach area, or upper arm. It is usually given once every week (every 7 days). You will be taught how to prepare and give this medicine. Use exactly as directed. Take your medicine at regular intervals. Do not take it more often than directed. If you use this medicine with insulin, you should inject this medicine and the insulin separately. Do not mix them together. Do not give the injections right next to each other. Change (rotate) injection sites with each injection. It is important that you put your used needles and syringes in a special sharps container. Do not put them in a trash can. If you do not have a sharps container, call your pharmacist or healthcare provider to get one. A special MedGuide will be given to you by  the pharmacist with each prescription and refill. Be sure to read this information carefully each time. Talk to your pediatrician regarding the use of this medicine in children. Special care may be needed. Overdosage: If you think you have taken too much of this medicine contact a poison control center or emergency room at once. NOTE: This medicine is only for you. Do not share this medicine with others. What if I miss a dose? If you miss a dose, take it as soon as you can within 3 days after the missed dose. Then take your next dose at your regular weekly time. If it has been longer than 3 days after the missed dose, do not take the missed dose. Take the next dose at your regular time. Do not take double or extra doses. If you have questions about a missed dose, contact your health care provider for advice. What may interact with this medicine? -other medicines for diabetes Many medications may cause changes in blood sugar, these include: -alcohol containing beverages -antiviral medicines for HIV or AIDS -aspirin and aspirin-like drugs -certain medicines for blood pressure, heart disease, irregular heart beat -chromium -diuretics -male hormones, such as estrogens or progestins, birth control pills -fenofibrate -gemfibrozil -isoniazid -lanreotide -male hormones or anabolic steroids -MAOIs like Carbex, Eldepryl, Marplan, Nardil, and Parnate -medicines for weight loss -medicines for allergies, asthma, cold, or cough -medicines for depression, anxiety, or psychotic disturbances -niacin -nicotine -NSAIDs, medicines for pain and inflammation, like ibuprofen or naproxen -octreotide -pasireotide -pentamidine -phenytoin -probenecid -quinolone antibiotics  such as ciprofloxacin, levofloxacin, ofloxacin -some herbal dietary supplements -steroid medicines such as prednisone or cortisone -sulfamethoxazole; trimethoprim -thyroid hormones Some medications can hide the warning symptoms of low  blood sugar (hypoglycemia). You may need to monitor your blood sugar more closely if you are taking one of these medications. These include: -beta-blockers, often used for high blood pressure or heart problems (examples include atenolol, metoprolol, propranolol) -clonidine -guanethidine -reserpine This list may not describe all possible interactions. Give your health care provider a list of all the medicines, herbs, non-prescription drugs, or dietary supplements you use. Also tell them if you smoke, drink alcohol, or use illegal drugs. Some items may interact with your medicine. What should I watch for while using this medicine? Visit your doctor or health care professional for regular checks on your progress. Drink plenty of fluids while taking this medicine. Check with your doctor or health care professional if you get an attack of severe diarrhea, nausea, and vomiting. The loss of too much body fluid can make it dangerous for you to take this medicine. A test called the HbA1C (A1C) will be monitored. This is a simple blood test. It measures your blood sugar control over the last 2 to 3 months. You will receive this test every 3 to 6 months. Learn how to check your blood sugar. Learn the symptoms of low and high blood sugar and how to manage them. Always carry a quick-source of sugar with you in case you have symptoms of low blood sugar. Examples include hard sugar candy or glucose tablets. Make sure others know that you can choke if you eat or drink when you develop serious symptoms of low blood sugar, such as seizures or unconsciousness. They must get medical help at once. Tell your doctor or health care professional if you have high blood sugar. You might need to change the dose of your medicine. If you are sick or exercising more than usual, you might need to change the dose of your medicine. Do not skip meals. Ask your doctor or health care professional if you should avoid alcohol. Many  nonprescription cough and cold products contain sugar or alcohol. These can affect blood sugar. Pens should never be shared. Even if the needle is changed, sharing may result in passing of viruses like hepatitis or HIV. Wear a medical ID bracelet or chain, and carry a card that describes your disease and details of your medicine and dosage times. What side effects may I notice from receiving this medicine? Side effects that you should report to your doctor or health care professional as soon as possible: -allergic reactions like skin rash, itching or hives, swelling of the face, lips, or tongue -breathing problems -diarrhea that continues or is severe -lump or swelling on the neck -severe nausea -signs and symptoms of infection like fever or chills; cough; sore throat; pain or trouble passing urine -signs and symptoms of low blood sugar such as feeling anxious, confusion, dizziness, increased hunger, unusually weak or tired, sweating, shakiness, cold, irritable, headache, blurred vision, fast heartbeat, loss of consciousness -signs and symptoms of kidney injury like trouble passing urine or change in the amount of urine -trouble swallowing -unusual stomach upset or pain -vomiting Side effects that usually do not require medical attention (report to your doctor or health care professional if they continue or are bothersome): -diarrhea -loss of appetite -nausea -pain, redness, or irritation at site where injected -stomach upset This list may not describe all possible side effects. Call your  doctor for medical advice about side effects. You may report side effects to FDA at 1-800-FDA-1088. Where should I keep my medicine? Keep out of the reach of children. Store unopened pens in a refrigerator between 2 and 8 degrees C (36 and 46 degrees F). Do not freeze or use if the medicine has been frozen. Protect from light and excessive heat. Store in the carton until use. Each single-dose pen can be  kept at room temperature, not to exceed 30 degrees C (86 degrees F) for a total of 14 days, if needed. Throw away any unused medicine after the expiration date on the label. NOTE: This sheet is a summary. It may not cover all possible information. If you have questions about this medicine, talk to your doctor, pharmacist, or health care provider.  2018 Elsevier/Gold Standard (2016-08-05 14:35:01)

## 2018-07-17 ENCOUNTER — Telehealth: Payer: Self-pay | Admitting: Family Medicine

## 2018-07-17 NOTE — Telephone Encounter (Signed)
Handwritten notes are ready to pickup at front desk

## 2018-07-17 NOTE — Telephone Encounter (Signed)
Patient advised.

## 2018-07-17 NOTE — Telephone Encounter (Signed)
Patient calling in stating that he needs 3 separate work notes. The notes needs to say that he can go back tomorrow, Wednesday or Thursday. Patient would like to pick up the notes today. Please advise.

## 2018-07-18 ENCOUNTER — Ambulatory Visit: Payer: Self-pay | Admitting: Family Medicine

## 2018-08-03 ENCOUNTER — Telehealth: Payer: Self-pay | Admitting: Family Medicine

## 2018-08-03 NOTE — Telephone Encounter (Signed)
I received fax request from [facility] regarding gabapentin.  The original prescription was written to allow you to increase the dose.  I would like to refill it at the dose you are actually taking.  How many pills and how frequently are you taking this medication?

## 2018-08-03 NOTE — Telephone Encounter (Signed)
Left VM for Pt to return clinic call.  

## 2018-08-03 NOTE — Telephone Encounter (Signed)
I received a fax refill request from [facility] regarding Gabapentin. Because I wrote the orignical prscrption to

## 2018-08-10 ENCOUNTER — Telehealth: Payer: Self-pay

## 2018-08-10 NOTE — Telephone Encounter (Signed)
Mailed all [employer] paperwork to home address

## 2018-08-15 ENCOUNTER — Telehealth: Payer: Self-pay | Admitting: Family Medicine

## 2018-08-15 NOTE — Telephone Encounter (Signed)
Patient wanted to know if he can get the injections done for both hands for the carpal tunnel on the same day as his follow up appointment, 08/18/2018. Also he wanted to know how long the numbness will last if given the injections. Please advise.

## 2018-08-16 NOTE — Telephone Encounter (Signed)
Left VM for Pt to return clinic call.  

## 2018-08-16 NOTE — Telephone Encounter (Signed)
I can do injections on both sides bilateral is a good idea as the steroid dose will be a bit high and increased sugar levels.  Numbness will last several hours.

## 2018-08-18 ENCOUNTER — Encounter: Payer: Self-pay | Admitting: Family Medicine

## 2018-08-18 ENCOUNTER — Ambulatory Visit (INDEPENDENT_AMBULATORY_CARE_PROVIDER_SITE_OTHER): Payer: BLUE CROSS/BLUE SHIELD | Admitting: Family Medicine

## 2018-08-18 VITALS — BP 146/75 | HR 79 | Ht 68.5 in | Wt 207.0 lb

## 2018-08-18 DIAGNOSIS — E1140 Type 2 diabetes mellitus with diabetic neuropathy, unspecified: Secondary | ICD-10-CM

## 2018-08-18 DIAGNOSIS — G5603 Carpal tunnel syndrome, bilateral upper limbs: Secondary | ICD-10-CM | POA: Diagnosis not present

## 2018-08-18 DIAGNOSIS — Z794 Long term (current) use of insulin: Secondary | ICD-10-CM

## 2018-08-18 DIAGNOSIS — I428 Other cardiomyopathies: Secondary | ICD-10-CM

## 2018-08-18 DIAGNOSIS — I5022 Chronic systolic (congestive) heart failure: Secondary | ICD-10-CM

## 2018-08-18 DIAGNOSIS — I110 Hypertensive heart disease with heart failure: Secondary | ICD-10-CM

## 2018-08-18 LAB — POCT UA - MICROALBUMIN
ALBUMIN/CREATININE RATIO, URINE, POC: 30
Creatinine, POC: 100 mg/dL
Microalbumin Ur, POC: 30 mg/L

## 2018-08-18 MED ORDER — EMPAGLIFLOZIN 25 MG PO TABS: 25.0000 mg | ORAL_TABLET | Freq: Every day | ORAL | 3 refills | Status: DC

## 2018-08-18 NOTE — Progress Notes (Signed)
Seth Weeks is a 58 y.o. male who presents to [facility] today for follow-up diabetes hypertension heart failure symptoms and carpal tunnel syndrome.  Carpal tunnel syndrome: Seth Weeks has a history of bilateral carpal tunnel syndrome.  This has waxed and waned.  He is used wrist braces in the past which have helped.  He notes that since he is returned to work his carpal tunnel syndrome has worsened.  He is not had steroid injections before.  He notes numbness and tingling into his hands.  He denies weakness bilaterally.    Diabetes: Patient previously had well-controlled diabetes however following his heart failure exacerbation his blood sugars have been much more difficult to control.  He is currently taking insulin, Janumet and [place].  He continue to titrate his insulin and work on a lower carbohydrate diet.  In the interim he notes that his blood sugars have been moderately controlled.  He notes sometimes he forgets to take his medication.  Additionally he notes that his formulary changed and Wilder Glade is no longer preferred.  They recommend Jardiance.  Hypertension: Currently taking medications listed below.  He notes that again he forgets his medicine sometimes.  He notes typically his blood pressures pretty well controlled.  No chest pain palpitations or shortness of breath.  Heart failure: Patient continued to improve with exertional capacity return to work in mid December.  He notes that symptoms have improved a lot.  He is able to work normally now.     ROS as above:  Exam:  BP (!) 146/75   Pulse 79   Ht 5' 8.5" (1.74 m)   Wt 207 lb (93.9 kg)   BMI 31.02 kg/m  Wt Readings from Last 5 Encounters:  08/18/18 207 lb (93.9 kg)  07/14/18 200 lb (90.7 kg)  06/16/18 203 lb (92.1 kg)  05/19/18 184 lb (83.5 kg)  04/21/18 190 lb (86.2 kg)    Gen: Well NAD HEENT: EOMI,   MMM Lungs: Normal work of breathing. CTABL Heart: RRR no MRG Abd: NABS, Soft. Nondistended, Nontender Exts: Brisk capillary refill, warm and well perfused.  Right hand relatively normal-appearing with no atrophy.  Normal motion.  Positive Tinel's at carpal tunnel.  Left hand normal-appearing no atrophy.  Normal motion.  Positive Tinel's at carpal tunnel. Strength intact bilateral hands.   Lab and Radiology Results Procedure: Real-time Ultrasound Guided Injection of left wrist carpal tunnel median nerve hydrodissection Device: GE Logiq E   Images permanently stored and available for review in the ultrasound unit. Verbal informed consent obtained.  Discussed risks and benefits of procedure. Warned about infection bleeding damage to structures skin hypopigmentation and fat atrophy among others. Patient expresses understanding and agreement Time-out conducted.   Noted no overlying erythema, induration, or other signs of local infection.   Skin prepped in a sterile fashion.   Local anesthesia: Topical Ethyl chloride.   With sterile technique and under real time ultrasound guidance:  40 mg of Kenalog and 2 mL of lidocaine injected easily.   Completed without difficulty      Advised to call if fevers/chills, erythema, induration, drainage, or persistent bleeding.   Images permanently stored and available for review in the ultrasound unit.  Impression: Technically successful ultrasound guided injection.    Procedure: Real-time Ultrasound Guided Injection of right wrist median nerve hydrodissection of carpal tunnel Device: GE Logiq E   Images permanently stored and available for review in the ultrasound unit.  Verbal informed consent obtained.  Discussed risks and benefits of procedure. Warned about infection bleeding damage to structures skin hypopigmentation and fat atrophy among others. Patient expresses understanding and agreement Time-out conducted.   Noted no overlying erythema,  induration, or other signs of local infection.   Skin prepped in a sterile fashion.   Local anesthesia: Topical Ethyl chloride.   With sterile technique and under real time ultrasound guidance:  40 mg of Kenalog and 2 mL of lidocaine injected easily.   Completed without difficulty      Advised to call if fevers/chills, erythema, induration, drainage, or persistent bleeding.   Images permanently stored and available for review in the ultrasound unit.  Impression: Technically successful ultrasound guided injection.      Assessment and Plan: 58 y.o. male with  Bilateral carpal tunnel syndrome failing typical conservative management.  After discussion plan for bilateral injections.  Worried about hyperglycemia episodes.  Recommend bracing for a week or 2.  Recheck in 1 month.  Diabetes: Not ideally controlled.  Discuss strategies to improve medication adherence.  Switch from [place] to [place].  Recheck 1 month.  Hypertension and heart failure: Symptomatically improved.  Blood pressure not ideally controlled.  Continue medication compliance.  Discussed strategies.  Okay to take most doses in the evening where it may be easier to remember it  We will recheck mood at follow-up in 1 month. PDMP reviewed during this encounter. Orders Placed This Encounter  Procedures  . POCT UA - Microalbumin   Meds ordered this encounter  Medications  . empagliflozin (JARDIANCE) 25 MG TABS tablet    Sig: Take 25 mg by mouth daily.    Dispense:  90 tablet    Refill:  3    Replaces farxiga     Historical information moved to improve visibility of documentation.  Past Medical History:  Diagnosis Date  . CHF (congestive heart failure) ([place]) 04/11/2018  . Diabetes mellitus ([place])   . GAD (generalized anxiety disorder) 07/17/2015  . High triglycerides   . Hypertension   . MDD (major depressive disorder) 03/17/2018   Past Surgical History:  Procedure Laterality Date  . TONSILLECTOMY     Social  History   Tobacco Use  . Smoking status: Never Smoker  . Smokeless tobacco: Never Used  Substance Use Topics  . Alcohol use: Yes   family history includes Heart failure in his mother.  Medications: Current Outpatient Medications  Medication Sig Dispense Refill  . alprazolam (XANAX) 2 MG tablet Take 1 tablet (2 mg total) by mouth 3 (three) times daily as needed. for anxiety 90 tablet 1  . AMBULATORY NON FORMULARY MEDICATION Single glucometer of choice with lancets, test strips. Test 3x daily.  E11.65 Disp QS 1 months 1 each 0  . AMBULATORY NON FORMULARY MEDICATION Over the counter blood pressure machine.  Check daily HTN 1 each 0  . aspirin EC 81 MG tablet Take by mouth.    [name] atorvastatin (LIPITOR) 40 MG tablet Take by mouth.    [name] buPROPion (WELLBUTRIN XL) 150 MG 24 hr tablet Take 1 tablet (150 mg total) by mouth daily. 90 tablet 1  . Continuous Blood Gluc Receiver (FREESTYLE LIBRE 14 DAY READER) DEVI 1 Units by Does not apply route every 14 (fourteen) days. Use to check blood sugar daily, pairs with Freestyle Libra sensors. Please include sensors with device. Change sensor every 14 days. Dx: E11.40. 1 Device 0  . Continuous Blood Gluc Sensor (FREESTYLE LIBRE 14 DAY SENSOR) MISC 1  Units by Does not apply route every 14 (fourteen) days. Use to check blood sugar daily, change sensor every 14 days. Dx: E11.40. 2 each 3  . diphenoxylate-atropine (LOMOTIL) 2.5-0.025 MG tablet TAKE 1 TABLET BY MOUTH 4 TIMES DAILY AS NEEDED FOR DIARRHEA OR  LOOSE  STOOLS 120 tablet 3  . furosemide (LASIX) 40 MG tablet Take 1 tablet (40 mg total) by mouth daily. 30 tablet 3  . gabapentin (NEURONTIN) 300 MG capsule One tab PO qHS for a week, then BID for a week, then TID. May double weekly to a max of 3,600mg /day 180 capsule 3  . Insulin Pen Needle (ADVOCATE INSULIN PEN NEEDLES) 33G X 4 MM MISC 1 each by Does not apply route 2 (two) times daily. 100 each 12  . ivabradine (CORLANOR) 5 MG TABS tablet Take 5 mg by  mouth 2 (two) times daily.    [name] l-methylfolate-B6-B12 (METANX) 3-35-2 MG TABS tablet Take 1 tablet by mouth daily. 30 tablet 5  . lactase (LACTAID) 3000 units tablet Take 1 tablet (3,000 Units total) by mouth 3 (three) times daily as needed. 120 tablet 3  . levocetirizine (XYZAL) 5 MG tablet Take 1 tablet (5 mg total) by mouth every evening. 90 tablet 1  . loperamide (IMODIUM A-D) 2 MG tablet Take 1 tablet (2 mg total) by mouth 4 (four) times daily as needed for diarrhea or loose stools. 90 tablet 1  . metoprolol succinate (TOPROL-XL) 50 MG 24 hr tablet Take 50 mg by mouth daily.    . nitroGLYCERIN (NITROSTAT) 0.4 MG SL tablet Place 1 tablet (0.4 mg total) under the tongue every 5 (five) minutes as needed for chest pain. 50 tablet 3  . omeprazole (PRILOSEC) 40 MG capsule TAKE 1 CAPSULE BY MOUTH ONCE DAILY 90 capsule 3  . sacubitril-valsartan (ENTRESTO) 24-26 MG Take 1 tablet by mouth 2 (two) times daily.    . sitaGLIPtin-metformin (JANUMET) 50-1000 MG tablet Take 1 tablet 2 (two) times daily with a meal by mouth. 180 tablet 3  . empagliflozin (JARDIANCE) 25 MG TABS tablet Take 25 mg by mouth daily. 90 tablet 3  . Insulin Glargine (TOUJEO MAX SOLOSTAR) 300 UNIT/ML SOPN Inject 40 Units into the skin daily. 9 pen 3   No current facility-administered medications for this visit.    Allergies  Allergen Reactions  . Bee Venom Anaphylaxis  . Pregabalin Other (See Comments)    expensive expensive   . Penicillins   . Terramycin [Oxytetracycline]      Discussed warning signs or symptoms. Please see discharge instructions. Patient expresses understanding.

## 2018-08-18 NOTE — Patient Instructions (Signed)
Thank you for coming in today. Work on trying to find a time to take your medicine consistantly.  If you forget you can take it in the evening.   Recheck in 1 month.   Return sooner if needed.   Use the wrist braces.   Call or go to the ER if you develop a large red swollen joint with extreme pain or oozing puss.

## 2018-08-21 ENCOUNTER — Other Ambulatory Visit: Payer: Self-pay | Admitting: Family Medicine

## 2018-09-08 DIAGNOSIS — M79671 Pain in right foot: Secondary | ICD-10-CM | POA: Diagnosis not present

## 2018-09-08 DIAGNOSIS — E1142 Type 2 diabetes mellitus with diabetic polyneuropathy: Secondary | ICD-10-CM | POA: Diagnosis not present

## 2018-09-08 DIAGNOSIS — M47812 Spondylosis without myelopathy or radiculopathy, cervical region: Secondary | ICD-10-CM | POA: Diagnosis not present

## 2018-09-08 DIAGNOSIS — G894 Chronic pain syndrome: Secondary | ICD-10-CM | POA: Diagnosis not present

## 2018-09-21 ENCOUNTER — Other Ambulatory Visit: Payer: Self-pay | Admitting: Family Medicine

## 2018-09-21 DIAGNOSIS — F329 Major depressive disorder, single episode, unspecified: Secondary | ICD-10-CM

## 2018-09-21 DIAGNOSIS — F32A Depression, unspecified: Secondary | ICD-10-CM

## 2018-09-21 DIAGNOSIS — F419 Anxiety disorder, unspecified: Principal | ICD-10-CM

## 2018-09-22 ENCOUNTER — Ambulatory Visit: Payer: Self-pay | Admitting: Family Medicine

## 2018-10-06 ENCOUNTER — Ambulatory Visit: Payer: Self-pay | Admitting: Family Medicine

## 2018-10-16 ENCOUNTER — Other Ambulatory Visit: Payer: Self-pay | Admitting: Family Medicine

## 2018-10-16 DIAGNOSIS — F32A Depression, unspecified: Secondary | ICD-10-CM

## 2018-10-16 DIAGNOSIS — F329 Major depressive disorder, single episode, unspecified: Secondary | ICD-10-CM

## 2018-10-16 DIAGNOSIS — F419 Anxiety disorder, unspecified: Principal | ICD-10-CM

## 2018-10-16 NOTE — Telephone Encounter (Signed)
Will forward to provider to review.

## 2018-10-20 DIAGNOSIS — G894 Chronic pain syndrome: Secondary | ICD-10-CM | POA: Diagnosis not present

## 2018-10-20 DIAGNOSIS — M47812 Spondylosis without myelopathy or radiculopathy, cervical region: Secondary | ICD-10-CM | POA: Diagnosis not present

## 2018-10-20 DIAGNOSIS — E1142 Type 2 diabetes mellitus with diabetic polyneuropathy: Secondary | ICD-10-CM | POA: Diagnosis not present

## 2018-10-20 DIAGNOSIS — E1141 Type 2 diabetes mellitus with diabetic mononeuropathy: Secondary | ICD-10-CM | POA: Diagnosis not present

## 2018-10-20 DIAGNOSIS — M79671 Pain in right foot: Secondary | ICD-10-CM | POA: Diagnosis not present

## 2018-10-30 ENCOUNTER — Emergency Department (INDEPENDENT_AMBULATORY_CARE_PROVIDER_SITE_OTHER)
Admission: EM | Admit: 2018-10-30 | Discharge: 2018-10-30 | Disposition: A | Discharge status: Home / Self Care | Payer: BLUE CROSS/BLUE SHIELD | Source: Home / Self Care

## 2018-10-30 ENCOUNTER — Telehealth: Payer: Self-pay

## 2018-10-30 ENCOUNTER — Other Ambulatory Visit: Payer: Self-pay

## 2018-10-30 DIAGNOSIS — R110 Nausea: Secondary | ICD-10-CM

## 2018-10-30 DIAGNOSIS — K047 Periapical abscess without sinus: Secondary | ICD-10-CM

## 2018-10-30 MED ORDER — ONDANSETRON HCL 4 MG PO TABS: 4.0000 mg | ORAL_TABLET | Freq: Four times a day (QID) | ORAL | 0 refills | Status: DC

## 2018-10-30 MED ORDER — CLINDAMYCIN HCL 300 MG PO CAPS: 300.0000 mg | ORAL_CAPSULE | Freq: Three times a day (TID) | ORAL | 0 refills | Status: DC

## 2018-10-30 MED ORDER — CLINDAMYCIN HCL 300 MG PO CAPS: 300.0000 mg | ORAL_CAPSULE | Freq: Three times a day (TID) | ORAL | 0 refills | Status: AC

## 2018-10-30 MED ORDER — ONDANSETRON 4 MG PO TBDP
4.0000 mg | ORAL_TABLET | Freq: Once | ORAL | Status: AC
  Administered 2018-10-30: 4 mg via ORAL

## 2018-10-30 MED ORDER — CHLORHEXIDINE GLUCONATE 0.12% ORAL RINSE (MEDLINE KIT): 15.0000 mL | Freq: Two times a day (BID) | OROMUCOSAL | 0 refills | Status: DC

## 2018-10-30 NOTE — ED Triage Notes (Signed)
Pt is having tooth pain on the left, upper and lower. Also nausea x 2 days.  No vomiting, no fever

## 2018-10-30 NOTE — ED Provider Notes (Signed)
[name]    CSN: [id number] Arrival date & time: 10/30/18  1706     History   Chief Complaint Chief Complaint  Patient presents with  . Dental Pain  . Nausea    HPI Seth Weeks is a 58 y.o. male.   HPI Seth Weeks is a 58 y.o. male presenting to UC with c/o 2-3 days of nausea and Left upper dental pain that is mild to moderate, aching and throbbing. No relief with peroxide rinse. Hx of a bad tooth in the same area in the past. He has been prescribed antibiotics but has not f/u with a dentist before tooth becomes re-infected.  Denies fever, chills, vomiting or diarrhea. Denies abdominal pain. No sick contacts or recent travel. No medication taken PTA.    Past Medical History:  Diagnosis Date  . CHF (congestive heart failure) ([place]) 04/11/2018  . Diabetes mellitus ([place])   . GAD (generalized anxiety disorder) 07/17/2015  . High triglycerides   . Hypertension   . MDD (major depressive disorder) 03/17/2018    Patient Active Problem List   Diagnosis Date Noted  . Coronary artery disease involving native coronary artery of native heart without angina pectoris 04/14/2018  . Chronic systolic congestive heart failure ([place]) 04/11/2018  . Nonischemic cardiomyopathy ([place]) 04/11/2018  . Pericardial effusion 04/11/2018  . MDD (major depressive disorder) 03/17/2018  . Metatarsalgia 03/17/2018  . Carpal tunnel syndrome 03/17/2018  . Peripheral neuropathy 06/16/2017  . Type 2 diabetes mellitus with diabetic neuropathy, unspecified ([place]) 06/16/2017  . Chronic pain 04/26/2017  . Bilateral tennis elbow 03/22/2017  . Skin lesion 03/03/2016  . GAD (generalized anxiety disorder) 07/17/2015  . Pes cavus 01/28/2015  . Umbilical hernia 73/71/0626  . Benign hypertension with coincident congestive heart failure ([place]) 10/19/2013  . BPV (benign positional vertigo) 07/23/2013  . Erectile dysfunction 05/25/2013    Past Surgical History:  Procedure Laterality Date  .  TONSILLECTOMY         Home Medications    Prior to Admission medications   Medication Sig Start Date End Date Taking? Authorizing Provider  ADVOCATE INSULIN PEN NEEDLES 33G X 4 MM MISC USE 1  TWICE DAILY 09/22/18   Gregor Hams, MD  alprazolam Duanne Moron) 2 MG tablet TAKE 1 TABLET BY MOUTH THREE TIMES DAILY AS NEEDED FOR ANXIETY 10/17/18   Gregor Hams, MD  AMBULATORY NON FORMULARY MEDICATION Single glucometer of choice with lancets, test strips. Test 3x daily.  E11.65 Disp QS 1 months 05/20/17   Gregor Hams, MD  AMBULATORY NON FORMULARY MEDICATION Over the counter blood pressure machine.  Check daily HTN 07/14/18   Gregor Hams, MD  aspirin EC 81 MG tablet Take by mouth. 04/18/18 04/18/19  [provider]  atorvastatin (LIPITOR) 40 MG tablet Take by mouth. 04/20/18   [provider]  buPROPion (WELLBUTRIN XL) 150 MG 24 hr tablet Take 1 tablet (150 mg total) by mouth daily. 06/16/18   Gregor Hams, MD  chlorhexidine gluconate, MEDLINE KIT, (PERIDEX) 0.12 % solution Use as directed 15 mLs in the mouth or throat 2 (two) times daily. 10/30/18   Noe Gens, PA-C  clindamycin (CLEOCIN) 300 MG capsule Take 1 capsule (300 mg total) by mouth 3 (three) times daily for 7 days. X 7 days 10/30/18 11/06/18  Noe Gens, PA-C  Continuous Blood Gluc Receiver (FREESTYLE LIBRE 14 DAY READER) DEVI 1 Units by Does not apply route every 14 (fourteen) days. Use to check blood sugar  daily, pairs with Freestyle Libra sensors. Please include sensors with device. Change sensor every 14 days. Dx: E11.40. 03/28/18   Gregor Hams, MD  Continuous Blood Gluc Sensor (FREESTYLE LIBRE 14 DAY SENSOR) MISC 1 Units by Does not apply route every 14 (fourteen) days. Use to check blood sugar daily, change sensor every 14 days. Dx: E11.40. 07/14/18   Gregor Hams, MD  diphenoxylate-atropine (LOMOTIL) 2.5-0.025 MG tablet TAKE 1 TABLET BY MOUTH 4 TIMES DAILY AS NEEDED FOR DIARRHEA OR  LOOSE  STOOLS 01/16/18    Gregor Hams, MD  empagliflozin (JARDIANCE) 25 MG TABS tablet Take 25 mg by mouth daily. 08/18/18   Gregor Hams, MD  furosemide (LASIX) 40 MG tablet TAKE 1 TABLET BY MOUTH ONCE DAILY 08/21/18   Gregor Hams, MD  gabapentin (NEURONTIN) 300 MG capsule One tab PO qHS for a week, then BID for a week, then TID. May double weekly to a max of 3,'600mg'$ /day 07/06/18   Gregor Hams, MD  Insulin Glargine (TOUJEO MAX SOLOSTAR) 300 UNIT/ML SOPN Inject 40 Units into the skin daily. 05/19/18 08/17/18  Gregor Hams, MD  ivabradine (CORLANOR) 5 MG TABS tablet Take 5 mg by mouth 2 (two) times daily. 05/11/18   [provider]  JANUMET 50-1000 MG tablet TAKE 1 TABLET BY MOUTH TWICE DAILY WITH A MEAL 08/21/18   Gregor Hams, MD  l-methylfolate-B6-B12 (METANX) 3-35-2 MG TABS tablet Take 1 tablet by mouth daily. 02/03/18   Breeback, Jade L, PA-C  lactase (LACTAID) 3000 units tablet Take 1 tablet (3,000 Units total) by mouth 3 (three) times daily as needed. 01/10/17   Gregor Hams, MD  levocetirizine (XYZAL) 5 MG tablet Take 1 tablet (5 mg total) by mouth every evening. 06/16/18   Gregor Hams, MD  loperamide (IMODIUM A-D) 2 MG tablet Take 1 tablet (2 mg total) by mouth 4 (four) times daily as needed for diarrhea or loose stools. 06/16/18   Gregor Hams, MD  metoprolol succinate (TOPROL-XL) 50 MG 24 hr tablet Take 50 mg by mouth daily. 04/18/18   [provider]  nitroGLYCERIN (NITROSTAT) 0.4 MG SL tablet Place 1 tablet (0.4 mg total) under the tongue every 5 (five) minutes as needed for chest pain. 05/19/18   Gregor Hams, MD  omeprazole (PRILOSEC) 40 MG capsule TAKE 1 CAPSULE BY MOUTH ONCE DAILY 06/26/18   Gregor Hams, MD  ondansetron (ZOFRAN) 4 MG tablet Take 1 tablet (4 mg total) by mouth every 6 (six) hours. 10/30/18   Noe Gens, PA-C  sacubitril-valsartan (ENTRESTO) 24-26 MG Take 1 tablet by mouth 2 (two) times daily. 05/11/18   [provider]    Family History Family History   Problem Relation Age of Onset  . Heart failure Mother     Social History Social History   Tobacco Use  . Smoking status: Never Smoker  . Smokeless tobacco: Never Used  Substance Use Topics  . Alcohol use: Yes  . Drug use: No     Allergies   Bee venom; Pregabalin; Penicillins; and Terramycin [oxytetracycline]   Review of Systems Review of Systems  Constitutional: Negative for chills and fever.  HENT: Positive for dental problem. Negative for ear pain, facial swelling and sore throat.   Gastrointestinal: Positive for nausea. Negative for diarrhea and vomiting.  Musculoskeletal: Negative for neck pain and neck stiffness.  Neurological: Negative for dizziness and headaches.     Physical Exam Triage Vital Signs ED Triage  Vitals  Enc Vitals Group     BP 10/30/18 1825 (!) 142/94     Pulse Rate 10/30/18 1825 100     Resp 10/30/18 1825 20     Temp 10/30/18 1825 98.5 F (36.9 C)     Temp Source 10/30/18 1825 Oral     SpO2 10/30/18 1825 97 %     Weight 10/30/18 1826 199 lb (90.3 kg)     Height 10/30/18 1826 5' 8.5" (1.74 m)     Head Circumference --      Peak Flow --      Pain Score 10/30/18 1825 8     Pain Loc --      Pain Edu? --      Excl. in [place]? --    No data found.  Updated Vital Signs BP (!) 142/94 (BP Location: Right Arm)   Pulse 100   Temp 98.5 F (36.9 C) (Oral)   Resp 20   Ht 5' 8.5" (1.74 m)   Wt 199 lb (90.3 kg)   SpO2 97%   BMI 29.82 kg/m   Visual Acuity Right Eye Distance:   Left Eye Distance:   Bilateral Distance:    Right Eye Near:   Left Eye Near:    Bilateral Near:     Physical Exam Vitals signs and nursing note reviewed.  Constitutional:      Appearance: Normal appearance. He is well-developed.  HENT:     Head: Normocephalic and atraumatic.     Jaw: No trismus.     Right Ear: Tympanic membrane normal.     Left Ear: Tympanic membrane normal.     Nose: Nose normal.     Right Sinus: No maxillary sinus tenderness or frontal sinus  tenderness.     Left Sinus: No maxillary sinus tenderness or frontal sinus tenderness.     Mouth/Throat:     Lips: Pink.     Mouth: Mucous membranes are moist.     Dentition: Abnormal dentition. Dental tenderness, dental caries and dental abscesses present.     Pharynx: Oropharynx is clear. Uvula midline.     Comments: Multiple dental carries. Dental decay of Left upper last tooth. Neck:     Musculoskeletal: Normal range of motion.  Cardiovascular:     Rate and Rhythm: Normal rate and regular rhythm.  Pulmonary:     Effort: Pulmonary effort is normal.     Breath sounds: Normal breath sounds.  Abdominal:     General: There is no distension.     Palpations: Abdomen is soft.     Tenderness: There is no abdominal tenderness.  Musculoskeletal: Normal range of motion.  Skin:    General: Skin is warm and dry.  Neurological:     Mental Status: He is alert and oriented to person, place, and time.  Psychiatric:        Behavior: Behavior normal.      UC Treatments / Results  Labs (all labs ordered are listed, but only abnormal results are displayed) Labs Reviewed - No data to display  EKG None  Radiology No results found.  Procedures Procedures (including critical care time)  Medications Ordered in UC Medications  ondansetron (ZOFRAN-ODT) disintegrating tablet 4 mg (4 mg Oral Given 10/30/18 1831)    Initial Impression / Assessment and Plan / UC Course  I have reviewed the triage vital signs and the nursing notes.  Pertinent labs & imaging results that were available during my care of the patient were reviewed by  me and considered in my medical decision making (see chart for details).     Nausea likely from dental infection given no URI symptoms or abdominal pain. Will start on antibiotics and zofran   Final Clinical Impressions(s) / UC Diagnoses   Final diagnoses:  Dental abscess  Nausea without vomiting     Discharge Instructions      Please take antibiotics  as prescribed and be sure to complete entire course even if you start to feel better to ensure infection does not come back.  Please follow up with family medicine in 1 week for recheck of symptoms and follow up with your dentist whenever possible.  Call 911 or go to the hospital if symptoms significantly worsening.     ED Prescriptions    Medication Sig Dispense Auth. Provider   ondansetron (ZOFRAN) 4 MG tablet Take 1 tablet (4 mg total) by mouth every 6 (six) hours. 12 tablet Gerarda Fraction, Elissia Spiewak O, PA-C   clindamycin (CLEOCIN) 300 MG capsule Take 1 capsule (300 mg total) by mouth 3 (three) times daily for 7 days. X 7 days 21 capsule Leeroy Cha O, PA-C   chlorhexidine gluconate, MEDLINE KIT, (PERIDEX) 0.12 % solution Use as directed 15 mLs in the mouth or throat 2 (two) times daily. 120 mL Noe Gens, PA-C     Controlled Substance Prescriptions [place] Controlled Substance Registry consulted? Not Applicable   Tyrell Antonio 10/30/18 1948

## 2018-10-30 NOTE — Discharge Instructions (Signed)
°  Please take antibiotics as prescribed and be sure to complete entire course even if you start to feel better to ensure infection does not come back.  Please follow up with family medicine in 1 week for recheck of symptoms and follow up with your dentist whenever possible.  Call 911 or go to the hospital if symptoms significantly worsening.

## 2018-11-01 ENCOUNTER — Other Ambulatory Visit: Payer: Self-pay | Admitting: Family Medicine

## 2018-11-01 ENCOUNTER — Other Ambulatory Visit: Payer: Self-pay | Admitting: *Deleted

## 2018-11-17 DIAGNOSIS — M47812 Spondylosis without myelopathy or radiculopathy, cervical region: Secondary | ICD-10-CM | POA: Diagnosis not present

## 2018-11-17 DIAGNOSIS — M79671 Pain in right foot: Secondary | ICD-10-CM | POA: Diagnosis not present

## 2018-11-17 DIAGNOSIS — G894 Chronic pain syndrome: Secondary | ICD-10-CM | POA: Diagnosis not present

## 2018-11-17 DIAGNOSIS — E1142 Type 2 diabetes mellitus with diabetic polyneuropathy: Secondary | ICD-10-CM | POA: Diagnosis not present

## 2018-11-24 ENCOUNTER — Ambulatory Visit (INDEPENDENT_AMBULATORY_CARE_PROVIDER_SITE_OTHER): Payer: BLUE CROSS/BLUE SHIELD | Admitting: Family Medicine

## 2018-11-24 ENCOUNTER — Encounter: Payer: Self-pay | Admitting: Family Medicine

## 2018-11-24 VITALS — Temp 98.6°F | Ht 68.0 in | Wt 207.0 lb

## 2018-11-24 DIAGNOSIS — I10 Essential (primary) hypertension: Secondary | ICD-10-CM

## 2018-11-24 DIAGNOSIS — I2510 Atherosclerotic heart disease of native coronary artery without angina pectoris: Secondary | ICD-10-CM

## 2018-11-24 DIAGNOSIS — Z794 Long term (current) use of insulin: Secondary | ICD-10-CM

## 2018-11-24 DIAGNOSIS — I428 Other cardiomyopathies: Secondary | ICD-10-CM | POA: Diagnosis not present

## 2018-11-24 DIAGNOSIS — I110 Hypertensive heart disease with heart failure: Secondary | ICD-10-CM

## 2018-11-24 DIAGNOSIS — E1140 Type 2 diabetes mellitus with diabetic neuropathy, unspecified: Secondary | ICD-10-CM

## 2018-11-24 DIAGNOSIS — I152 Hypertension secondary to endocrine disorders: Secondary | ICD-10-CM | POA: Insufficient documentation

## 2018-11-24 DIAGNOSIS — E1159 Type 2 diabetes mellitus with other circulatory complications: Secondary | ICD-10-CM

## 2018-11-24 DIAGNOSIS — I5022 Chronic systolic (congestive) heart failure: Secondary | ICD-10-CM | POA: Diagnosis not present

## 2018-11-24 MED ORDER — INSULIN GLARGINE (2 UNIT DIAL) 300 UNIT/ML ~~LOC~~ SOPN: 60.0000 [IU] | PEN_INJECTOR | Freq: Every day | SUBCUTANEOUS | 3 refills | Status: DC

## 2018-11-24 MED ORDER — INSULIN PEN NEEDLE 34G X 3.5 MM MISC: 1.0000 | Freq: Every day | 12 refills | Status: DC

## 2018-11-24 MED ORDER — PROMETHAZINE HCL 25 MG PO TABS: 25.0000 mg | ORAL_TABLET | Freq: Three times a day (TID) | ORAL | 2 refills | Status: DC | PRN

## 2018-11-24 MED ORDER — ONDANSETRON HCL 4 MG PO TABS: 4.0000 mg | ORAL_TABLET | Freq: Three times a day (TID) | ORAL | 0 refills | Status: DC | PRN

## 2018-11-24 NOTE — Progress Notes (Signed)
Spoke with pt to obtain vitals for web ex. Pt reports has been feeling tired, run down and nauseated. Pt states he has not been able to take some of his meds due to nausea. Pt reports his blood sugar this morning is 145. Pt states he is gonna get in his car to see if he can get a good connection, I advised him if unable to get on Web that Dr. Georgina Snell will call him.

## 2018-11-24 NOTE — Progress Notes (Signed)
Virtual Visit  via Video Note  I connected with      Seth Weeks by a video enabled telemedicine application and verified that I am speaking with the correct person using two identifiers.   I discussed the limitations of evaluation and management by telemedicine and the availability of in person appointments. The patient expressed understanding and agreed to proceed.  History of Present Illness: Seth Weeks is a 58 y.o. male who would like to discuss diabetes hypertension toe pain and nausea.  [name] notes that overall he has been a little more stressed recently and has been not very adherent to his medications.  He is able to get all of his diabetes medicines but is having trouble with some of his cardiology medications.    Diabetes: As noted above not being super consistent with medications however is currently taking medications now.  Back on his insulin taking about 60 units a day most of the time his blood sugar is around 140s on average with the freestyle libre meter.  He is feeling physically a little bit better with no polyuria or polydipsia.  He has a history of coronary artery disease with systolic heart failure.  He has concomitant coronary artery disease and had heart failure due to nonischemic cardiomyopathy.  He is improving overall and currently taking medications listed below.  He notes he is having trouble getting the Colanor and Entresto due to some insurance issues.  He thinks those have been fixed and he plans on picking them up and starting taking them soon.  He notes his toenails have been growing long.  He has trouble trimming them because he cannot get to his toes or see them very well at times.  He notes his toenails are longer now because he cannot get them trimmed due to COVID-19.  He tried to get in with a podiatrist but cannot do that either.  He notes his toenails are becoming somewhat painful and is interested in in person visit to discuss this and get his toenails  treated in the future.  Also he notes that he had a dental infection in late March and was prescribed clindamycin.  The dental infection resolved but the clindamycin caused significant diarrhea that is slowly improving over the last several weeks getting better now.  He has some nausea as result of that and is interested in some antinausea medication.  Has had benefit with promethazine and Zofran in the past.       Observations/Objective: Temp 98.6 F (37 C) (Oral)   Ht 5\' 8"  (1.727 m)   Wt 207 lb (93.9 kg)   BMI 31.47 kg/m  Wt Readings from Last 5 Encounters:  11/24/18 207 lb (93.9 kg)  10/30/18 199 lb (90.3 kg)  08/18/18 207 lb (93.9 kg)  07/14/18 200 lb (90.7 kg)  06/16/18 203 lb (92.1 kg)   Exam: Appearance Normal Speech.  Psych alert and oriented normal speech and thought process.  Normal verbal affect.  No active SI or HI.  Depression screen [facility] 2/9 11/24/2018 08/18/2018 03/17/2018 04/25/2017 03/21/2017  Decreased Interest 3 2 3 3 3   Down, Depressed, Hopeless 2 3 3 2 3   PHQ - 2 Score 5 5 6 5 6   Altered sleeping 3 3 3 3 3   Tired, decreased energy 3 3 3 3 3   Change in appetite 1 1 3 2 1   Feeling bad or failure about yourself  3 3 3 3 3   Trouble concentrating 3 1 3  1  2  Moving slowly or fidgety/restless 2 0 3 1 2   Suicidal thoughts 2 1 3 1 3   PHQ-9 Score 22 17 27 19 23   Difficult doing work/chores Very difficult Very difficult Extremely dIfficult - -    GAD 7 : Generalized Anxiety Score 11/24/2018 08/18/2018 03/17/2018 01/10/2017  Nervous, Anxious, on Edge 2 2 3 3   Control/stop worrying 2 3 3 3   Worry too much - different things 2 3 3 3   Trouble relaxing 3 2 3 3   Restless 2 1 3 1   Easily annoyed or irritable 2 1 3 3   Afraid - awful might happen 0 3 3 3   Total GAD 7 Score 13 15 21 19   Anxiety Difficulty Very difficult Very difficult Extremely difficult Somewhat difficult     Lab and Radiology Results No results found for this or any previous visit (from the past 71  hour(s)). No results found.   Assessment and Plan: 58 y.o. male with  Diabetes: Improving control back on medications.  Refilled medications and will continue to follow.  During in person visit near future for toenail will check A1c.  Continue lifestyle modification as well.  Heart disease: Blood pressure not checked today because this is a home visit he does not have a blood pressure meter.  However will reassess blood pressure during upcoming office visit.  Regardless he is not on some of his medications because of some insurance issues.  Recommend downloading the copayment card from the website of the cardiology medications.  If he cannot get his medications will prescribe generic equivalents for example valsartan instead of Entresto.  Toe pain: Toenails not trimmed and elongating.  Patient will benefit from in person visit for foot evaluation as well as toenail trimming.  Nausea: Likely is result of clindamycin exposure.  Slowly improving.  Clindamycin added to allergy list.  Prescribed Zofran and Phenergan.  Watchful waiting.  PDMP reviewed during this encounter. No orders of the defined types were placed in this encounter.  Meds ordered this encounter  Medications  . promethazine (PHENERGAN) 25 MG tablet    Sig: Take 1 tablet (25 mg total) by mouth every 8 (eight) hours as needed for nausea or vomiting.    Dispense:  30 tablet    Refill:  2  . ondansetron (ZOFRAN) 4 MG tablet    Sig: Take 1 tablet (4 mg total) by mouth every 8 (eight) hours as needed for nausea or vomiting.    Dispense:  30 tablet    Refill:  0  . Insulin Glargine, 2 Unit Dial, (TOUJEO MAX SOLOSTAR) 300 UNIT/ML SOPN    Sig: Inject 60 Units as directed daily.    Dispense:  9 mL    Refill:  3  . Insulin Pen Needle 34G X 3.5 MM MISC    Sig: 1 each by Does not apply route daily.    Dispense:  100 each    Refill:  12    May substitute length and gauge to patient prefrence    Follow Up Instructions:    I  discussed the assessment and treatment plan with the patient. The patient was provided an opportunity to ask questions and all were answered. The patient agreed with the plan and demonstrated an understanding of the instructions.   The patient was advised to call back or seek an in-person evaluation if the symptoms worsen or if the condition fails to improve as anticipated.  Time: 25 minutes of intraservice time, with >39 minutes of total  time during today's visit.      Historical information moved to improve visibility of documentation.  Past Medical History:  Diagnosis Date  . CHF (congestive heart failure) ([place]) 04/11/2018  . Diabetes mellitus ([place])   . GAD (generalized anxiety disorder) 07/17/2015  . High triglycerides   . Hypertension   . MDD (major depressive disorder) 03/17/2018   Past Surgical History:  Procedure Laterality Date  . TONSILLECTOMY     Social History   Tobacco Use  . Smoking status: Never Smoker  . Smokeless tobacco: Never Used  Substance Use Topics  . Alcohol use: Yes   family history includes Heart failure in his mother.  Medications: Current Outpatient Medications  Medication Sig Dispense Refill  . alprazolam (XANAX) 2 MG tablet TAKE 1 TABLET BY MOUTH THREE TIMES DAILY AS NEEDED FOR ANXIETY 90 tablet 2  . AMBULATORY NON FORMULARY MEDICATION Single glucometer of choice with lancets, test strips. Test 3x daily.  E11.65 Disp QS 1 months 1 each 0  . AMBULATORY NON FORMULARY MEDICATION Over the counter blood pressure machine.  Check daily HTN 1 each 0  . aspirin EC 81 MG tablet Take by mouth.    [name] atorvastatin (LIPITOR) 40 MG tablet Take by mouth.    [name] buPROPion (WELLBUTRIN XL) 150 MG 24 hr tablet Take 1 tablet (150 mg total) by mouth daily. 90 tablet 1  . Continuous Blood Gluc Receiver (FREESTYLE LIBRE 14 DAY READER) DEVI 1 Units by Does not apply route every 14 (fourteen) days. Use to check blood sugar daily, pairs with Freestyle Libra sensors. Please  include sensors with device. Change sensor every 14 days. Dx: E11.40. 1 Device 0  . Continuous Blood Gluc Sensor (FREESTYLE LIBRE 14 DAY SENSOR) MISC 1 Units by Does not apply route every 14 (fourteen) days. Use to check blood sugar daily, change sensor every 14 days. Dx: E11.40. 2 each 3  . diphenoxylate-atropine (LOMOTIL) 2.5-0.025 MG tablet TAKE 1 TABLET BY MOUTH 4 TIMES DAILY AS NEEDED FOR DIARRHEA OR  LOOSE  STOOLS 120 tablet 3  . empagliflozin (JARDIANCE) 25 MG TABS tablet Take 25 mg by mouth daily. 90 tablet 3  . furosemide (LASIX) 40 MG tablet TAKE 1 TABLET BY MOUTH ONCE DAILY 30 tablet 0  . gabapentin (NEURONTIN) 300 MG capsule One tab PO qHS for a week, then BID for a week, then TID. May double weekly to a max of 3,600mg /day 180 capsule 3  . Insulin Glargine, 2 Unit Dial, (TOUJEO MAX SOLOSTAR) 300 UNIT/ML SOPN Inject 60 Units as directed daily. 9 mL 3  . Insulin Pen Needle 34G X 3.5 MM MISC 1 each by Does not apply route daily. 100 each 12  . ivabradine (CORLANOR) 5 MG TABS tablet Take 5 mg by mouth 2 (two) times daily.    [name] JANUMET 50-1000 MG tablet TAKE 1 TABLET BY MOUTH TWICE DAILY WITH A MEAL 180 tablet 0  . l-methylfolate-B6-B12 (METANX) 3-35-2 MG TABS tablet Take 1 tablet by mouth daily. 30 tablet 5  . lactase (LACTAID) 3000 units tablet Take 1 tablet (3,000 Units total) by mouth 3 (three) times daily as needed. 120 tablet 3  . levocetirizine (XYZAL) 5 MG tablet Take 1 tablet (5 mg total) by mouth every evening. 90 tablet 1  . loperamide (IMODIUM A-D) 2 MG tablet Take 1 tablet (2 mg total) by mouth 4 (four) times daily as needed for diarrhea or loose stools. 90 tablet 1  . metoprolol succinate (TOPROL-XL) 50 MG 24 hr  tablet Take 50 mg by mouth daily.    . nitroGLYCERIN (NITROSTAT) 0.4 MG SL tablet Place 1 tablet (0.4 mg total) under the tongue every 5 (five) minutes as needed for chest pain. 50 tablet 3  . omeprazole (PRILOSEC) 40 MG capsule TAKE 1 CAPSULE BY MOUTH ONCE DAILY 90  capsule 3  . ondansetron (ZOFRAN) 4 MG tablet Take 1 tablet (4 mg total) by mouth every 8 (eight) hours as needed for nausea or vomiting. 30 tablet 0  . promethazine (PHENERGAN) 25 MG tablet Take 1 tablet (25 mg total) by mouth every 8 (eight) hours as needed for nausea or vomiting. 30 tablet 2  . sacubitril-valsartan (ENTRESTO) 24-26 MG Take 1 tablet by mouth 2 (two) times daily.     No current facility-administered medications for this visit.    Allergies  Allergen Reactions  . Bee Venom Anaphylaxis  . Clindamycin/Lincomycin Diarrhea    Bad diarrhea. Please do not give.   . Pregabalin Other (See Comments)    expensive expensive   . Penicillins   . Terramycin [Oxytetracycline]

## 2018-11-28 ENCOUNTER — Other Ambulatory Visit: Payer: Self-pay | Admitting: Family Medicine

## 2018-11-28 ENCOUNTER — Telehealth: Payer: Self-pay | Admitting: Family Medicine

## 2018-11-28 NOTE — Telephone Encounter (Signed)
Pt called clinic to see if he was able to take his zofran Rx more often than every 8 hours. Reports it does help him, but he needs it more often. Routing to PCP for review.

## 2018-11-29 MED ORDER — ONDANSETRON HCL 8 MG PO TABS: 4.0000 mg | ORAL_TABLET | Freq: Three times a day (TID) | ORAL | 1 refills | Status: DC | PRN

## 2018-11-29 NOTE — Telephone Encounter (Signed)
Zofran is an 8-hour medicine however there is a higher dose.  I sent in a prescription for the 8 mg pills that you can take.  Take those every 8 hours and see if it works any better.  You can just take 2 of the 4 mg pills while waiting to pick up the 8 mg pills as well.

## 2018-11-29 NOTE — Telephone Encounter (Signed)
Called and left pt detailed message of Dr Corey's recommendations. Pt was also sent a MyChart msg of update. Direct call back information provided

## 2018-11-30 ENCOUNTER — Encounter: Payer: Self-pay | Admitting: Family Medicine

## 2018-11-30 ENCOUNTER — Ambulatory Visit (INDEPENDENT_AMBULATORY_CARE_PROVIDER_SITE_OTHER): Payer: BLUE CROSS/BLUE SHIELD | Admitting: Family Medicine

## 2018-11-30 VITALS — BP 142/87 | HR 99 | Temp 98.4°F | Wt 200.0 lb

## 2018-11-30 DIAGNOSIS — I5022 Chronic systolic (congestive) heart failure: Secondary | ICD-10-CM

## 2018-11-30 DIAGNOSIS — G6289 Other specified polyneuropathies: Secondary | ICD-10-CM | POA: Diagnosis not present

## 2018-11-30 DIAGNOSIS — E1140 Type 2 diabetes mellitus with diabetic neuropathy, unspecified: Secondary | ICD-10-CM

## 2018-11-30 DIAGNOSIS — B351 Tinea unguium: Secondary | ICD-10-CM

## 2018-11-30 DIAGNOSIS — E1169 Type 2 diabetes mellitus with other specified complication: Secondary | ICD-10-CM

## 2018-11-30 DIAGNOSIS — Z794 Long term (current) use of insulin: Secondary | ICD-10-CM | POA: Diagnosis not present

## 2018-11-30 DIAGNOSIS — I2510 Atherosclerotic heart disease of native coronary artery without angina pectoris: Secondary | ICD-10-CM

## 2018-11-30 DIAGNOSIS — I110 Hypertensive heart disease with heart failure: Secondary | ICD-10-CM

## 2018-11-30 LAB — POCT GLYCOSYLATED HEMOGLOBIN (HGB A1C): Hemoglobin A1C: 9.2 % — AB (ref 4.0–5.6)

## 2018-11-30 MED ORDER — MELOXICAM 15 MG PO TABS: 15.0000 mg | ORAL_TABLET | Freq: Every day | ORAL | 5 refills | Status: DC | PRN

## 2018-11-30 MED ORDER — EMPAGLIFLOZIN 25 MG PO TABS: 25.0000 mg | ORAL_TABLET | Freq: Every day | ORAL | 3 refills | Status: DC

## 2018-11-30 NOTE — Patient Instructions (Signed)
Thank you for coming in today.  Get A1c.  Continue medicines.  You should hear about foot doctor referral.  Recheck in 3 months or sooner if needed.  Try to floss your toes at least 1x per week.

## 2018-11-30 NOTE — Progress Notes (Signed)
Rapheal Weeks is a 58 y.o. male who presents to [facility] [place] today for follow-up foot pain and toenails.  [name] has diabetes with peripheral neuropathy.  He has difficulty getting to his feet and has been unable to care for his feet recently.  He previously was getting the services done at nail salon's but with COVID-19 he does not have good access to foot care.  He notes that he did at some point in the past have a podiatrist but that office is closed and he is without a podiatrist currently and having trouble make an appointment also due to COVID-19.  He notes his toenails are getting to be longer and 1 of them is now curled around and growing into his toe on his left foot causing pain.  He has difficulty cleaning his feet appropriately and is worried that he may develop an infection.  Additionally he notes that he has been unable to get some of his blood pressure medications until recently.  He just restarted his Entresto after being able to get it.  As for his diabetes he takes medications listed below.  He is out of [place] or [place].  He is not sure which 1 of those he is supposed to be taking but notes he does not have it any longer.  His blood sugars have been typically pretty well controlled   ROS as above:  Exam:  BP (!) 142/87   Pulse 99   Temp 98.4 F (36.9 C) (Oral)   Wt 200 lb (90.7 kg)   BMI 30.41 kg/m  Wt Readings from Last 5 Encounters:  11/30/18 200 lb (90.7 kg)  11/24/18 207 lb (93.9 kg)  10/30/18 199 lb (90.3 kg)  08/18/18 207 lb (93.9 kg)  07/14/18 200 lb (90.7 kg)    Gen: Well NAD HEENT: EOMI,  MMM Lungs: Normal work of breathing. CTABL Heart: RRR no MRG Abd: NABS, Soft. Nondistended, Nontender Exts: Brisk capillary refill, warm and well perfused.  Feet bilaterally have crusted dried skin in between the interdigital webspace.  Toenails are  elongated with thickened whitish left great toenail consistent with onychomycosis.  The left third toenail is curved around ingrowing into the tip of the toe in a ram's horn pattern.  He does not have any ulcerations present or wounds present.  Sensation is diminished and ticklish or sometimes hypersensitive as well.  Pulses are intact however.  Toenails were trimmed and dried dead skin removed feet cleaned and lotion applied.  Lab and Radiology Results Results for orders placed or performed in visit on 11/30/18 (from the past 72 hour(s))  POCT HgB A1C     Status: Abnormal   Collection Time: 11/30/18  4:28 PM  Result Value Ref Range   Hemoglobin A1C 9.2 (A) 4.0 - 5.6 %   HbA1c POC (<> result, manual entry)     HbA1c, POC (prediabetic range)     HbA1c, POC (controlled diabetic range)        Assessment and Plan: 58 y.o. male with diabetic peripheral neuropathy with onychomycosis.  Toenails trimmed and dead skin removed as above.  Additionally patient will refer to podiatry for more definitive long-term management.  Diabetes: A1c elevated today.  This is likely because he has been out of some of his medications.  Jardiance refilled.  Could switch to [place] if insurance prefers however.  Recheck A1c in about 3 months.  Hypertension and coronary artery disease.  Blood  pressure a bit elevated today likely additionally because of the medication issues.  Restart medications and check blood pressure in a few months.  Continue chronic management.  PDMP not reviewed this encounter. Orders Placed This Encounter  Procedures  . Ambulatory referral to Podiatry    Referral Priority:   Routine    Referral Type:   Consultation    Referral Reason:   Specialty Services Required    Requested Specialty:   Podiatry    Number of Visits Requested:   1  . POCT HgB A1C   Meds ordered this encounter  Medications  . meloxicam (MOBIC) 15 MG tablet    Sig: Take 1 tablet (15 mg total) by mouth daily as needed  for pain.    Dispense:  30 tablet    Refill:  5  . empagliflozin (JARDIANCE) 25 MG TABS tablet    Sig: Take 25 mg by mouth daily.    Dispense:  90 tablet    Refill:  3    Replaces farxiga     Historical information moved to improve visibility of documentation.  Past Medical History:  Diagnosis Date  . CHF (congestive heart failure) ([place]) 04/11/2018  . Diabetes mellitus ([place])   . GAD (generalized anxiety disorder) 07/17/2015  . High triglycerides   . Hypertension   . MDD (major depressive disorder) 03/17/2018   Past Surgical History:  Procedure Laterality Date  . TONSILLECTOMY     Social History   Tobacco Use  . Smoking status: Never Smoker  . Smokeless tobacco: Never Used  Substance Use Topics  . Alcohol use: Yes   family history includes Heart failure in his mother.  Medications: Current Outpatient Medications  Medication Sig Dispense Refill  . alprazolam (XANAX) 2 MG tablet TAKE 1 TABLET BY MOUTH THREE TIMES DAILY AS NEEDED FOR ANXIETY 90 tablet 2  . AMBULATORY NON FORMULARY MEDICATION Single glucometer of choice with lancets, test strips. Test 3x daily.  E11.65 Disp QS 1 months 1 each 0  . AMBULATORY NON FORMULARY MEDICATION Over the counter blood pressure machine.  Check daily HTN 1 each 0  . aspirin EC 81 MG tablet Take by mouth.    [name] atorvastatin (LIPITOR) 40 MG tablet Take by mouth.    [name] buPROPion (WELLBUTRIN XL) 150 MG 24 hr tablet Take 1 tablet (150 mg total) by mouth daily. 90 tablet 1  . Continuous Blood Gluc Receiver (FREESTYLE LIBRE 14 DAY READER) DEVI 1 Units by Does not apply route every 14 (fourteen) days. Use to check blood sugar daily, pairs with Freestyle Libra sensors. Please include sensors with device. Change sensor every 14 days. Dx: E11.40. 1 Device 0  . Continuous Blood Gluc Sensor (FREESTYLE LIBRE 14 DAY SENSOR) MISC 1 Units by Does not apply route every 14 (fourteen) days. Use to check blood sugar daily, change sensor every 14 days. Dx:  E11.40. 2 each 3  . diphenoxylate-atropine (LOMOTIL) 2.5-0.025 MG tablet TAKE 1 TABLET BY MOUTH 4 TIMES DAILY AS NEEDED FOR DIARRHEA OR  LOOSE  STOOLS 120 tablet 3  . empagliflozin (JARDIANCE) 25 MG TABS tablet Take 25 mg by mouth daily. 90 tablet 3  . furosemide (LASIX) 40 MG tablet Take 1 tablet by mouth once daily 30 tablet 0  . gabapentin (NEURONTIN) 300 MG capsule One tab PO qHS for a week, then BID for a week, then TID. May double weekly to a max of 3,600mg /day 180 capsule 3  . Insulin Glargine, 2 Unit Dial, (TOUJEO MAX SOLOSTAR)  300 UNIT/ML SOPN Inject 60 Units as directed daily. 9 mL 3  . Insulin Pen Needle 34G X 3.5 MM MISC 1 each by Does not apply route daily. 100 each 12  . ivabradine (CORLANOR) 5 MG TABS tablet Take 5 mg by mouth 2 (two) times daily.    [name] JANUMET 50-1000 MG tablet TAKE 1 TABLET BY MOUTH TWICE DAILY WITH A MEAL 180 tablet 0  . l-methylfolate-B6-B12 (METANX) 3-35-2 MG TABS tablet Take 1 tablet by mouth daily. 30 tablet 5  . lactase (LACTAID) 3000 units tablet Take 1 tablet (3,000 Units total) by mouth 3 (three) times daily as needed. 120 tablet 3  . levocetirizine (XYZAL) 5 MG tablet Take 1 tablet (5 mg total) by mouth every evening. 90 tablet 1  . loperamide (IMODIUM A-D) 2 MG tablet Take 1 tablet (2 mg total) by mouth 4 (four) times daily as needed for diarrhea or loose stools. 90 tablet 1  . meloxicam (MOBIC) 15 MG tablet Take 1 tablet (15 mg total) by mouth daily as needed for pain. 30 tablet 5  . metoprolol succinate (TOPROL-XL) 50 MG 24 hr tablet Take 50 mg by mouth daily.    . nitroGLYCERIN (NITROSTAT) 0.4 MG SL tablet Place 1 tablet (0.4 mg total) under the tongue every 5 (five) minutes as needed for chest pain. 50 tablet 3  . omeprazole (PRILOSEC) 40 MG capsule TAKE 1 CAPSULE BY MOUTH ONCE DAILY 90 capsule 3  . ondansetron (ZOFRAN) 8 MG tablet Take 0.5-1 tablets (4-8 mg total) by mouth every 8 (eight) hours as needed for nausea or vomiting. 30 tablet 1  .  promethazine (PHENERGAN) 25 MG tablet Take 1 tablet (25 mg total) by mouth every 8 (eight) hours as needed for nausea or vomiting. 30 tablet 2  . sacubitril-valsartan (ENTRESTO) 24-26 MG Take 1 tablet by mouth 2 (two) times daily.     No current facility-administered medications for this visit.    Allergies  Allergen Reactions  . Bee Venom Anaphylaxis  . Clindamycin/Lincomycin Diarrhea    Bad diarrhea. Please do not give.   . Pregabalin Other (See Comments)    expensive expensive   . Penicillins   . Terramycin [Oxytetracycline]      Discussed warning signs or symptoms. Please see discharge instructions. Patient expresses understanding.

## 2018-12-04 ENCOUNTER — Other Ambulatory Visit: Payer: Self-pay | Admitting: Family Medicine

## 2018-12-04 DIAGNOSIS — Z794 Long term (current) use of insulin: Principal | ICD-10-CM

## 2018-12-04 DIAGNOSIS — E162 Hypoglycemia, unspecified: Secondary | ICD-10-CM

## 2018-12-04 DIAGNOSIS — E1140 Type 2 diabetes mellitus with diabetic neuropathy, unspecified: Secondary | ICD-10-CM

## 2018-12-05 ENCOUNTER — Encounter: Payer: Self-pay | Admitting: Family Medicine

## 2018-12-05 ENCOUNTER — Ambulatory Visit (INDEPENDENT_AMBULATORY_CARE_PROVIDER_SITE_OTHER): Payer: BLUE CROSS/BLUE SHIELD | Admitting: Family Medicine

## 2018-12-05 VITALS — Temp 97.6°F | Wt 200.0 lb

## 2018-12-05 DIAGNOSIS — I110 Hypertensive heart disease with heart failure: Secondary | ICD-10-CM | POA: Diagnosis not present

## 2018-12-05 DIAGNOSIS — E1140 Type 2 diabetes mellitus with diabetic neuropathy, unspecified: Secondary | ICD-10-CM | POA: Diagnosis not present

## 2018-12-05 DIAGNOSIS — G6289 Other specified polyneuropathies: Secondary | ICD-10-CM | POA: Diagnosis not present

## 2018-12-05 DIAGNOSIS — R5383 Other fatigue: Secondary | ICD-10-CM | POA: Diagnosis not present

## 2018-12-05 DIAGNOSIS — I428 Other cardiomyopathies: Secondary | ICD-10-CM

## 2018-12-05 DIAGNOSIS — Z794 Long term (current) use of insulin: Secondary | ICD-10-CM

## 2018-12-05 NOTE — Progress Notes (Signed)
Virtual Visit  via Video Note  I connected with      Seth Weeks by a video enabled telemedicine application and verified that I am speaking with the correct person using two identifiers.   I discussed the limitations of evaluation and management by telemedicine and the availability of in person appointments. The patient expressed understanding and agreed to proceed.  History of Present Illness: Seth Weeks is a 58 y.o. male who would like to discuss fatigue.    [name] notes worsening fatigue over the past several weeks.  He notes he had been out of his medicine for some time for both his diabetes and his heart failure.  He has restarted most of his medicine but notes he still feels fatigued.  He notes exertional dyspnea and general malaise.  Additionally he notes nausea without vomiting.  He also feels increased thirst and feels he has been dehydrated.  He denies significant polyuria.  He notes his blood sugars have been pretty well controlled from around 100 fasting to 200 after meals.  Additionally he thinks his blood pressures have been pretty well controlled as well.  He denies chest pain palpitations or orthopnea.  He denies any leg swelling.     Observations/Objective: Temp 97.6 F (36.4 C) (Oral)   Wt 200 lb (90.7 kg)   BMI 30.41 kg/m  Wt Readings from Last 5 Encounters:  12/05/18 200 lb (90.7 kg)  11/30/18 200 lb (90.7 kg)  11/24/18 207 lb (93.9 kg)  10/30/18 199 lb (90.3 kg)  08/18/18 207 lb (93.9 kg)   Exam: Appearance Normal Speech.  No tachypnea or hoarse voice quality.    Assessment and Plan: 58 y.o. male with fatigue.  Unclear etiology.  Patient has multiple medical problems that could certainly contribute to fatigue.  Concerned about possible electrolyte abnormality.  Additionally will check TSH and troponin get especially given history of heart failure.  Also check CK for potential myositis.  If all is normal would likely proceed with echocardiogram.  PDMP  not reviewed this encounter. Orders Placed This Encounter  Procedures  . CBC  . COMPLETE METABOLIC PANEL WITH GFR  . TSH  . CK  . Troponin I   No orders of the defined types were placed in this encounter.   Follow Up Instructions:    I discussed the assessment and treatment plan with the patient. The patient was provided an opportunity to ask questions and all were answered. The patient agreed with the plan and demonstrated an understanding of the instructions.   The patient was advised to call back or seek an in-person evaluation if the symptoms worsen or if the condition fails to improve as anticipated.  Time: 25 minutes of intraservice time, with >39 minutes of total time during today's visit.      Historical information moved to improve visibility of documentation.  Past Medical History:  Diagnosis Date  . CHF (congestive heart failure) ([place]) 04/11/2018  . Diabetes mellitus ([place])   . GAD (generalized anxiety disorder) 07/17/2015  . High triglycerides   . Hypertension   . MDD (major depressive disorder) 03/17/2018   Past Surgical History:  Procedure Laterality Date  . TONSILLECTOMY     Social History   Tobacco Use  . Smoking status: Never Smoker  . Smokeless tobacco: Never Used  Substance Use Topics  . Alcohol use: Yes   family history includes Heart failure in his mother.  Medications: Current Outpatient Medications  Medication Sig Dispense Refill  .  alprazolam (XANAX) 2 MG tablet TAKE 1 TABLET BY MOUTH THREE TIMES DAILY AS NEEDED FOR ANXIETY 90 tablet 2  . AMBULATORY NON FORMULARY MEDICATION Single glucometer of choice with lancets, test strips. Test 3x daily.  E11.65 Disp QS 1 months 1 each 0  . AMBULATORY NON FORMULARY MEDICATION Over the counter blood pressure machine.  Check daily HTN 1 each 0  . aspirin EC 81 MG tablet Take by mouth.    [name] atorvastatin (LIPITOR) 40 MG tablet Take by mouth.    [name] buPROPion (WELLBUTRIN XL) 150 MG 24 hr tablet Take 1  tablet (150 mg total) by mouth daily. 90 tablet 1  . Continuous Blood Gluc Receiver (FREESTYLE LIBRE 14 DAY READER) DEVI 1 Units by Does not apply route every 14 (fourteen) days. Use to check blood sugar daily, pairs with Freestyle Libra sensors. Please include sensors with device. Change sensor every 14 days. Dx: E11.40. 1 Device 0  . Continuous Blood Gluc Sensor (FREESTYLE LIBRE 14 DAY SENSOR) MISC USE TO CHECK BLOOD SUGAR DAILY, CHANGE EVERY 14 DAYS. 2 each 0  . diphenoxylate-atropine (LOMOTIL) 2.5-0.025 MG tablet TAKE 1 TABLET BY MOUTH 4 TIMES DAILY AS NEEDED FOR DIARRHEA OR  LOOSE  STOOLS 120 tablet 3  . empagliflozin (JARDIANCE) 25 MG TABS tablet Take 25 mg by mouth daily. 90 tablet 3  . furosemide (LASIX) 40 MG tablet Take 1 tablet by mouth once daily 30 tablet 0  . gabapentin (NEURONTIN) 300 MG capsule One tab PO qHS for a week, then BID for a week, then TID. May double weekly to a max of 3,600mg /day 180 capsule 3  . Insulin Glargine, 2 Unit Dial, (TOUJEO MAX SOLOSTAR) 300 UNIT/ML SOPN Inject 60 Units as directed daily. 9 mL 3  . Insulin Pen Needle 34G X 3.5 MM MISC 1 each by Does not apply route daily. 100 each 12  . ivabradine (CORLANOR) 5 MG TABS tablet Take 5 mg by mouth 2 (two) times daily.    [name] JANUMET 50-1000 MG tablet TAKE 1 TABLET BY MOUTH TWICE DAILY WITH A MEAL 180 tablet 0  . l-methylfolate-B6-B12 (METANX) 3-35-2 MG TABS tablet Take 1 tablet by mouth daily. 30 tablet 5  . lactase (LACTAID) 3000 units tablet Take 1 tablet (3,000 Units total) by mouth 3 (three) times daily as needed. 120 tablet 3  . levocetirizine (XYZAL) 5 MG tablet Take 1 tablet (5 mg total) by mouth every evening. 90 tablet 1  . loperamide (IMODIUM A-D) 2 MG tablet Take 1 tablet (2 mg total) by mouth 4 (four) times daily as needed for diarrhea or loose stools. 90 tablet 1  . meloxicam (MOBIC) 15 MG tablet Take 1 tablet (15 mg total) by mouth daily as needed for pain. 30 tablet 5  . metoprolol succinate  (TOPROL-XL) 50 MG 24 hr tablet Take 50 mg by mouth daily.    . nitroGLYCERIN (NITROSTAT) 0.4 MG SL tablet Place 1 tablet (0.4 mg total) under the tongue every 5 (five) minutes as needed for chest pain. 50 tablet 3  . omeprazole (PRILOSEC) 40 MG capsule TAKE 1 CAPSULE BY MOUTH ONCE DAILY 90 capsule 3  . ondansetron (ZOFRAN) 8 MG tablet Take 0.5-1 tablets (4-8 mg total) by mouth every 8 (eight) hours as needed for nausea or vomiting. 30 tablet 1  . promethazine (PHENERGAN) 25 MG tablet Take 1 tablet (25 mg total) by mouth every 8 (eight) hours as needed for nausea or vomiting. 30 tablet 2  . sacubitril-valsartan (ENTRESTO) 24-26  MG Take 1 tablet by mouth 2 (two) times daily.     No current facility-administered medications for this visit.    Allergies  Allergen Reactions  . Bee Venom Anaphylaxis  . Clindamycin/Lincomycin Diarrhea    Bad diarrhea. Please do not give.   . Pregabalin Other (See Comments)    expensive expensive   . Penicillins   . Terramycin [Oxytetracycline]

## 2018-12-05 NOTE — Patient Instructions (Signed)
Thank you for coming in today. Get labs now.  Reduce dose of janumet by 1/2 for a 1-2 weeks then increase.  If labs are ok next step is echocardiogram.

## 2018-12-07 ENCOUNTER — Telehealth: Payer: Self-pay | Admitting: Family Medicine

## 2018-12-07 NOTE — Telephone Encounter (Signed)
Pt called office stating he accidentally took his levocetirizine (XYZAL) 5 MG tablet with his AM medications instead of PM mediations. Pt was advised this is his allergy tablet, and he may experience some increased drowsiness but no severe symptoms. Pt questioned if he should take it again tonight, advised him to hold tonight and resume regular nightly routine tomorrow.  Verbalized understanding, no further questions.

## 2018-12-07 NOTE — Telephone Encounter (Signed)
Medication: Jardiance 25MG  Tablets  Key: [id number]  COVER[employer]MEDS

## 2018-12-08 NOTE — Telephone Encounter (Signed)
Information has been sent to insurance and waiting on a response.   

## 2018-12-12 MED ORDER — DAPAGLIFLOZIN PROPANEDIOL 10 MG PO TABS: 10.0000 mg | ORAL_TABLET | Freq: Every day | ORAL | 3 refills | Status: DC

## 2018-12-12 NOTE — Telephone Encounter (Signed)
Left VM with update.  

## 2018-12-12 NOTE — Telephone Encounter (Signed)
Message from Plan Request Reference Number: [id number]. FARXIGA TAB 10MG  is approved through 12/12/2019. For further questions, call [phone]. Pharmacy aware.

## 2018-12-12 NOTE — Telephone Encounter (Signed)
Received a fax from insurance that Seth Weeks is a plan exclusion and is not covered. Do you have any other recommendations? Please advise.

## 2018-12-12 NOTE — Addendum Note (Signed)
Addended by: Gregor Hams on: 12/12/2018 09:20 AM   Modules accepted: Orders

## 2018-12-12 NOTE — Telephone Encounter (Signed)
We will switch to [place].  Seth Weeks is probably going to be covered if Seth Weeks is not.  We could also use Seth Weeks if needed

## 2018-12-20 ENCOUNTER — Telehealth: Payer: Self-pay

## 2018-12-20 DIAGNOSIS — F32A Depression, unspecified: Secondary | ICD-10-CM

## 2018-12-20 DIAGNOSIS — F419 Anxiety disorder, unspecified: Secondary | ICD-10-CM

## 2018-12-20 DIAGNOSIS — F329 Major depressive disorder, single episode, unspecified: Secondary | ICD-10-CM

## 2018-12-20 MED ORDER — ALPRAZOLAM 2 MG PO TABS: 2.0000 mg | ORAL_TABLET | Freq: Three times a day (TID) | ORAL | 2 refills | Status: DC | PRN

## 2018-12-20 NOTE — Telephone Encounter (Signed)
Pt is requesting a med refill for xanax. Pls send to [facility].   Pt also wanted provider to be aware that he is having trouble getting in contact with patient assistance for Toujeo med.

## 2018-12-20 NOTE — Telephone Encounter (Signed)
Pt has been updated of med refill sent to local  Pharmacy. No other inquiries during call.

## 2018-12-20 NOTE — Telephone Encounter (Signed)
Xanax refilled.  

## 2018-12-22 DIAGNOSIS — M79671 Pain in right foot: Secondary | ICD-10-CM | POA: Diagnosis not present

## 2018-12-22 DIAGNOSIS — M47812 Spondylosis without myelopathy or radiculopathy, cervical region: Secondary | ICD-10-CM | POA: Diagnosis not present

## 2018-12-22 DIAGNOSIS — G894 Chronic pain syndrome: Secondary | ICD-10-CM | POA: Diagnosis not present

## 2018-12-22 DIAGNOSIS — E1142 Type 2 diabetes mellitus with diabetic polyneuropathy: Secondary | ICD-10-CM | POA: Diagnosis not present

## 2018-12-27 ENCOUNTER — Other Ambulatory Visit: Payer: Self-pay | Admitting: Family Medicine

## 2018-12-27 DIAGNOSIS — Z794 Long term (current) use of insulin: Secondary | ICD-10-CM

## 2018-12-27 DIAGNOSIS — E162 Hypoglycemia, unspecified: Secondary | ICD-10-CM

## 2018-12-27 DIAGNOSIS — E1140 Type 2 diabetes mellitus with diabetic neuropathy, unspecified: Secondary | ICD-10-CM

## 2019-01-08 ENCOUNTER — Telehealth: Payer: Self-pay | Admitting: Family Medicine

## 2019-01-08 MED ORDER — CHLORHEXIDINE GLUCONATE 0.12 % MT SOLN: 15.0000 mL | Freq: Two times a day (BID) | OROMUCOSAL | 12 refills | Status: DC

## 2019-01-08 NOTE — Telephone Encounter (Signed)
Received refill request for chlorhexidine oral solution.  This was originally written by urgent care.  Will refill now.

## 2019-01-29 ENCOUNTER — Other Ambulatory Visit: Payer: Self-pay | Admitting: Family Medicine

## 2019-01-29 ENCOUNTER — Encounter: Payer: Self-pay | Admitting: Family Medicine

## 2019-01-31 ENCOUNTER — Other Ambulatory Visit: Payer: Self-pay | Admitting: Family Medicine

## 2019-01-31 MED ORDER — IVABRADINE HCL 5 MG PO TABS: 5.0000 mg | ORAL_TABLET | Freq: Two times a day (BID) | ORAL | 0 refills | Status: DC

## 2019-01-31 MED ORDER — FUROSEMIDE 40 MG PO TABS: 40.0000 mg | ORAL_TABLET | Freq: Every day | ORAL | 0 refills | Status: DC

## 2019-01-31 MED ORDER — BUPROPION HCL ER (XL) 150 MG PO TB24: ORAL_TABLET | ORAL | 0 refills | Status: DC

## 2019-02-01 MED ORDER — JANUMET 50-1000 MG PO TABS: ORAL_TABLET | ORAL | 1 refills | Status: DC

## 2019-02-01 MED ORDER — CHLORHEXIDINE GLUCONATE 0.12 % MT SOLN: 15.0000 mL | Freq: Two times a day (BID) | OROMUCOSAL | 3 refills | Status: DC

## 2019-02-01 MED ORDER — TOUJEO MAX SOLOSTAR 300 UNIT/ML ~~LOC~~ SOPN: 60.0000 [IU] | PEN_INJECTOR | Freq: Every day | SUBCUTANEOUS | 3 refills | Status: DC

## 2019-02-01 NOTE — Telephone Encounter (Signed)
Received refill request of Toujeo Janumet and chlorhexidine mouthwash to [facility].  Sent in 47-month supply with refills.

## 2019-02-06 ENCOUNTER — Telehealth: Payer: Self-pay | Admitting: Family Medicine

## 2019-02-06 MED ORDER — ATORVASTATIN CALCIUM 40 MG PO TABS: 40.0000 mg | ORAL_TABLET | Freq: Every day | ORAL | 1 refills | Status: DC

## 2019-02-06 NOTE — Telephone Encounter (Signed)
Refilled atorvastatin to [facility].  Please make sure you are taking this medication as it substantially reduces your risk of having a heart attack or stroke.

## 2019-02-08 ENCOUNTER — Other Ambulatory Visit: Payer: Self-pay | Admitting: Neurology

## 2019-02-08 MED ORDER — DIPHENOXYLATE-ATROPINE 2.5-0.025 MG PO TABS: ORAL_TABLET | ORAL | 0 refills | Status: DC

## 2019-03-02 ENCOUNTER — Ambulatory Visit: Payer: BLUE CROSS/BLUE SHIELD | Admitting: Family Medicine

## 2019-03-02 DIAGNOSIS — G894 Chronic pain syndrome: Secondary | ICD-10-CM | POA: Diagnosis not present

## 2019-03-02 DIAGNOSIS — M79671 Pain in right foot: Secondary | ICD-10-CM | POA: Diagnosis not present

## 2019-03-02 DIAGNOSIS — Z79899 Other long term (current) drug therapy: Secondary | ICD-10-CM | POA: Diagnosis not present

## 2019-03-02 DIAGNOSIS — E1142 Type 2 diabetes mellitus with diabetic polyneuropathy: Secondary | ICD-10-CM | POA: Diagnosis not present

## 2019-03-02 DIAGNOSIS — Z5181 Encounter for therapeutic drug level monitoring: Secondary | ICD-10-CM | POA: Diagnosis not present

## 2019-03-02 DIAGNOSIS — M47812 Spondylosis without myelopathy or radiculopathy, cervical region: Secondary | ICD-10-CM | POA: Diagnosis not present

## 2019-03-02 DIAGNOSIS — E1141 Type 2 diabetes mellitus with diabetic mononeuropathy: Secondary | ICD-10-CM | POA: Diagnosis not present

## 2019-03-16 ENCOUNTER — Encounter: Payer: Self-pay | Admitting: Family Medicine

## 2019-03-16 ENCOUNTER — Other Ambulatory Visit: Payer: Self-pay

## 2019-03-16 ENCOUNTER — Ambulatory Visit (INDEPENDENT_AMBULATORY_CARE_PROVIDER_SITE_OTHER): Payer: BC Managed Care – PPO | Admitting: Family Medicine

## 2019-03-16 VITALS — BP 143/96 | HR 93 | Temp 98.4°F | Wt 184.0 lb

## 2019-03-16 DIAGNOSIS — E1159 Type 2 diabetes mellitus with other circulatory complications: Secondary | ICD-10-CM

## 2019-03-16 DIAGNOSIS — E785 Hyperlipidemia, unspecified: Secondary | ICD-10-CM | POA: Insufficient documentation

## 2019-03-16 DIAGNOSIS — I152 Hypertension secondary to endocrine disorders: Secondary | ICD-10-CM

## 2019-03-16 DIAGNOSIS — I428 Other cardiomyopathies: Secondary | ICD-10-CM

## 2019-03-16 DIAGNOSIS — I5022 Chronic systolic (congestive) heart failure: Secondary | ICD-10-CM

## 2019-03-16 DIAGNOSIS — I110 Hypertensive heart disease with heart failure: Secondary | ICD-10-CM | POA: Diagnosis not present

## 2019-03-16 DIAGNOSIS — Z794 Long term (current) use of insulin: Secondary | ICD-10-CM

## 2019-03-16 DIAGNOSIS — I2510 Atherosclerotic heart disease of native coronary artery without angina pectoris: Secondary | ICD-10-CM | POA: Diagnosis not present

## 2019-03-16 DIAGNOSIS — E1140 Type 2 diabetes mellitus with diabetic neuropathy, unspecified: Secondary | ICD-10-CM

## 2019-03-16 DIAGNOSIS — F411 Generalized anxiety disorder: Secondary | ICD-10-CM

## 2019-03-16 DIAGNOSIS — I10 Essential (primary) hypertension: Secondary | ICD-10-CM

## 2019-03-16 LAB — POCT GLYCOSYLATED HEMOGLOBIN (HGB A1C): HbA1c POC (<> result, manual entry): >14 % (ref 4.0–5.6)

## 2019-03-16 MED ORDER — JARDIANCE 25 MG PO TABS: 25.0000 mg | ORAL_TABLET | Freq: Every day | ORAL | 1 refills | Status: DC

## 2019-03-16 MED ORDER — TOUJEO MAX SOLOSTAR 300 UNIT/ML ~~LOC~~ SOPN: 60.0000 [IU] | PEN_INJECTOR | Freq: Every day | SUBCUTANEOUS | 3 refills | Status: DC

## 2019-03-16 MED ORDER — FUROSEMIDE 40 MG PO TABS: 40.0000 mg | ORAL_TABLET | Freq: Every day | ORAL | 1 refills | Status: DC

## 2019-03-16 MED ORDER — OMEPRAZOLE 40 MG PO CPDR: 40.0000 mg | DELAYED_RELEASE_CAPSULE | Freq: Every day | ORAL | 3 refills | Status: AC

## 2019-03-16 MED ORDER — BUPROPION HCL ER (XL) 150 MG PO TB24: ORAL_TABLET | ORAL | 1 refills | Status: DC

## 2019-03-16 NOTE — Progress Notes (Signed)
Seth Weeks is a 58 y.o. male who presents to [facility] today for   Follow-up hypertension diabetes and heart disease.  Patient has diabetes is not well controlled.  He has effectively not been taking his medications over the last month or so.  He has had difficulty getting them at times.  He also has gotten very frustrated.  He is restarted his insulin.  He notes polyuria and polydipsia.  He does not check his blood sugar currently.  No chest pain palpitation shortness of breath.  Additionally has a history of hypertension and heart disease with heart failure.  He recently saw his cardiologist who change his medications to include metoprolol aspirin losartan and intermittent Lasix.  He is in the process of restarting these medications.  No chest pain or shortness of breath currently.  Additionally notes continued anxiety and depressive symptoms.  He currently is taking Xanax.  He is not taking Wellbutrin but is interested in restarting it.  He is not sure if it helped any.  ROS as above:  Exam:  BP (!) 143/96 (BP Location: Left Arm, Patient Position: Sitting, Cuff Size: Normal)   Pulse 93   Temp 98.4 F (36.9 C) (Oral)   Wt 184 lb (83.5 kg)   BMI 27.98 kg/m  Wt Readings from Last 5 Encounters:  03/16/19 184 lb (83.5 kg)  12/05/18 200 lb (90.7 kg)  11/30/18 200 lb (90.7 kg)  11/24/18 207 lb (93.9 kg)  10/30/18 199 lb (90.3 kg)    Gen: Well NAD HEENT: EOMI,  MMM Lungs: Normal work of breathing. CTABL Heart: RRR no MRG Abd: NABS, Soft. Nondistended, Nontender Exts: Brisk capillary refill, warm and well perfused.   Lab and Radiology Results Results for orders placed or performed in visit on 03/16/19 (from the past 72 hour(s))  POCT HgB A1C     Status: None   Collection Time: 03/16/19 11:27 AM  Result Value Ref Range   Hemoglobin A1C     HbA1c POC (<>  result, manual entry) >14.0 4.0 - 5.6 %   HbA1c, POC (prediabetic range)     HbA1c, POC (controlled diabetic range)     No results found.    Assessment and Plan: 58 y.o. male with  Diabetes: Not controlled.  Plan to restart insulin and restart Janumet.  We will also continue SGLT2.  Previously was prescribed Wilder Glade but his formulary prefers [place].  Will use Jardiance.  Recheck in 1 month.  We will get labs at that point.  Hypertension and heart disease: Again not very well controlled.  Plan to restart medications as prescribed by cardiology.  Reassess in 1 month.  Will get labs at that point.  Patient notes that he does not have time to get labs today.  Mood: Not well controlled either.  Patient has considerable irritability and anxiety.  He is done well with a pretty robust Xanax regimen over the years.  Although he is taking quite a bit of Xanax it is been stable and he has been functional and able to work because of it.  I think is reasonable to continue this medication.  Recommend restarting Wellbutrin and reassess in 1 month.  PDMP reviewed during this encounter. Orders Placed This Encounter  Procedures  . POCT HgB A1C   Meds ordered this encounter  Medications  . Insulin Glargine, 2 Unit Dial, (TOUJEO MAX SOLOSTAR) 300 UNIT/ML SOPN    Sig: Inject 60 Units  as directed daily.    Dispense:  9 mL    Refill:  3  . empagliflozin (JARDIANCE) 25 MG TABS tablet    Sig: Take 25 mg by mouth daily before breakfast.    Dispense:  90 tablet    Refill:  1  . omeprazole (PRILOSEC) 40 MG capsule    Sig: Take 1 capsule (40 mg total) by mouth daily.    Dispense:  90 capsule    Refill:  3  . furosemide (LASIX) 40 MG tablet    Sig: Take 1 tablet (40 mg total) by mouth daily.    Dispense:  90 tablet    Refill:  1  . buPROPion (WELLBUTRIN XL) 150 MG 24 hr tablet    Sig: TAKE 1 TABLET BY MOUTH ONCE DAILY IN THE MORNING    Dispense:  90 tablet    Refill:  1     Historical information  moved to improve visibility of documentation.  Past Medical History:  Diagnosis Date  . CHF (congestive heart failure) ([place]) 04/11/2018  . Diabetes mellitus ([place])   . GAD (generalized anxiety disorder) 07/17/2015  . High triglycerides   . Hypertension   . MDD (major depressive disorder) 03/17/2018   Past Surgical History:  Procedure Laterality Date  . TONSILLECTOMY     Social History   Tobacco Use  . Smoking status: Never Smoker  . Smokeless tobacco: Never Used  Substance Use Topics  . Alcohol use: Yes   family history includes Heart failure in his mother.  Medications: Current Outpatient Medications  Medication Sig Dispense Refill  . alprazolam (XANAX) 2 MG tablet Take 1 tablet (2 mg total) by mouth 3 (three) times daily as needed. for anxiety 90 tablet 2  . AMBULATORY NON FORMULARY MEDICATION Single glucometer of choice with lancets, test strips. Test 3x daily.  E11.65 Disp QS 1 months 1 each 0  . atorvastatin (LIPITOR) 40 MG tablet Take 1 tablet (40 mg total) by mouth daily. 90 tablet 1  . gabapentin (NEURONTIN) 300 MG capsule One tab PO qHS for a week, then BID for a week, then TID. May double weekly to a max of 3,600mg /day 180 capsule 3  . Insulin Pen Needle 34G X 3.5 MM MISC 1 each by Does not apply route daily. 100 each 12  . levocetirizine (XYZAL) 5 MG tablet Take 1 tablet (5 mg total) by mouth every evening. 90 tablet 1  . loperamide (IMODIUM A-D) 2 MG tablet Take 1 tablet (2 mg total) by mouth 4 (four) times daily as needed for diarrhea or loose stools. 90 tablet 1  . losartan (COZAAR) 25 MG tablet Take by mouth.    . promethazine (PHENERGAN) 25 MG tablet Take 1 tablet (25 mg total) by mouth every 8 (eight) hours as needed for nausea or vomiting. 30 tablet 2  . aspirin EC 81 MG tablet Take by mouth.    [name] buPROPion (WELLBUTRIN XL) 150 MG 24 hr tablet TAKE 1 TABLET BY MOUTH ONCE DAILY IN THE MORNING 90 tablet 1  . empagliflozin (JARDIANCE) 25 MG TABS tablet Take 25 mg by  mouth daily before breakfast. 90 tablet 1  . furosemide (LASIX) 40 MG tablet Take 1 tablet (40 mg total) by mouth daily. 90 tablet 1  . Insulin Glargine, 2 Unit Dial, (TOUJEO MAX SOLOSTAR) 300 UNIT/ML SOPN Inject 60 Units as directed daily. 9 mL 3  . metoprolol succinate (TOPROL-XL) 50 MG 24 hr tablet Take 50 mg by mouth daily.    [name]  nitroGLYCERIN (NITROSTAT) 0.4 MG SL tablet Place 1 tablet (0.4 mg total) under the tongue every 5 (five) minutes as needed for chest pain. (Patient not taking: Reported on 03/16/2019) 50 tablet 3  . omeprazole (PRILOSEC) 40 MG capsule Take 1 capsule (40 mg total) by mouth daily. 90 capsule 3  . ondansetron (ZOFRAN) 8 MG tablet Take 0.5-1 tablets (4-8 mg total) by mouth every 8 (eight) hours as needed for nausea or vomiting. (Patient not taking: Reported on 03/16/2019) 30 tablet 1  . sitaGLIPtin-metformin (JANUMET) 50-1000 MG tablet TAKE 1 TABLET BY MOUTH TWICE DAILY WITH A MEAL (Patient not taking: Reported on 03/16/2019) 180 tablet 1   No current facility-administered medications for this visit.    Allergies  Allergen Reactions  . Bee Venom Anaphylaxis  . Clindamycin/Lincomycin Diarrhea    Bad diarrhea. Please do not give.   . Pregabalin Other (See Comments)    expensive    . Penicillins   . Terramycin [Oxytetracycline]      Discussed warning signs or symptoms. Please see discharge instructions. Patient expresses understanding.

## 2019-03-16 NOTE — Patient Instructions (Addendum)
Thank you for coming in today. Continue insulin,  Continue janumet.  Start jardiance.   Recheck in 1 month.  Return sooner if needed.  Call or go to the emergency room if you get worse, have trouble breathing, have chest pains, or palpitations.   Try to hang in there.

## 2019-03-19 ENCOUNTER — Telehealth: Payer: Self-pay | Admitting: Family Medicine

## 2019-03-19 ENCOUNTER — Other Ambulatory Visit: Payer: Self-pay | Admitting: Family Medicine

## 2019-03-19 DIAGNOSIS — E162 Hypoglycemia, unspecified: Secondary | ICD-10-CM

## 2019-03-19 DIAGNOSIS — E1140 Type 2 diabetes mellitus with diabetic neuropathy, unspecified: Secondary | ICD-10-CM

## 2019-03-19 DIAGNOSIS — Z794 Long term (current) use of insulin: Secondary | ICD-10-CM

## 2019-03-19 NOTE — Telephone Encounter (Signed)
Patient is calling in to see if he can get a refill of his Freestyle libre sensors. I do not see this on his current list. Can you send this in? Please advise.

## 2019-03-20 NOTE — Telephone Encounter (Signed)
Attempted to call patient and was unable to leave a message.

## 2019-03-20 NOTE — Telephone Encounter (Signed)
Medication refilled

## 2019-03-22 NOTE — Telephone Encounter (Signed)
Attempted to call the patient and let him know the Freestyle sensors were sent to the pharmacy and was unable to leave a message.

## 2019-03-26 ENCOUNTER — Encounter: Payer: Self-pay | Admitting: Family Medicine

## 2019-03-26 DIAGNOSIS — F32A Depression, unspecified: Secondary | ICD-10-CM

## 2019-03-26 DIAGNOSIS — F329 Major depressive disorder, single episode, unspecified: Secondary | ICD-10-CM

## 2019-03-27 MED ORDER — ALPRAZOLAM 2 MG PO TABS: 2.0000 mg | ORAL_TABLET | Freq: Three times a day (TID) | ORAL | 5 refills | Status: DC | PRN

## 2019-03-28 ENCOUNTER — Other Ambulatory Visit: Payer: Self-pay | Admitting: Family Medicine

## 2019-04-03 ENCOUNTER — Other Ambulatory Visit: Payer: Self-pay | Admitting: Family Medicine

## 2019-04-03 DIAGNOSIS — F419 Anxiety disorder, unspecified: Secondary | ICD-10-CM

## 2019-04-03 DIAGNOSIS — F32A Depression, unspecified: Secondary | ICD-10-CM

## 2019-04-03 DIAGNOSIS — F329 Major depressive disorder, single episode, unspecified: Secondary | ICD-10-CM

## 2019-04-11 ENCOUNTER — Encounter: Payer: Self-pay | Admitting: Family Medicine

## 2019-04-20 ENCOUNTER — Ambulatory Visit: Payer: BC Managed Care – PPO | Admitting: Family Medicine

## 2019-04-27 ENCOUNTER — Other Ambulatory Visit: Payer: Self-pay

## 2019-04-27 ENCOUNTER — Encounter: Payer: Self-pay | Admitting: Family Medicine

## 2019-04-27 ENCOUNTER — Ambulatory Visit (INDEPENDENT_AMBULATORY_CARE_PROVIDER_SITE_OTHER): Payer: BC Managed Care – PPO | Admitting: Family Medicine

## 2019-04-27 VITALS — BP 128/87 | HR 86 | Wt 199.0 lb

## 2019-04-27 DIAGNOSIS — F411 Generalized anxiety disorder: Secondary | ICD-10-CM

## 2019-04-27 DIAGNOSIS — I2510 Atherosclerotic heart disease of native coronary artery without angina pectoris: Secondary | ICD-10-CM | POA: Diagnosis not present

## 2019-04-27 DIAGNOSIS — E1140 Type 2 diabetes mellitus with diabetic neuropathy, unspecified: Secondary | ICD-10-CM | POA: Diagnosis not present

## 2019-04-27 DIAGNOSIS — Z794 Long term (current) use of insulin: Secondary | ICD-10-CM

## 2019-04-27 DIAGNOSIS — I110 Hypertensive heart disease with heart failure: Secondary | ICD-10-CM

## 2019-04-27 DIAGNOSIS — I5022 Chronic systolic (congestive) heart failure: Secondary | ICD-10-CM

## 2019-04-27 MED ORDER — ONDANSETRON HCL 8 MG PO TABS: 4.0000 mg | ORAL_TABLET | Freq: Three times a day (TID) | ORAL | 1 refills | Status: DC | PRN

## 2019-04-27 MED ORDER — NITROGLYCERIN 0.4 MG SL SUBL: 0.4000 mg | SUBLINGUAL_TABLET | SUBLINGUAL | 3 refills | Status: DC | PRN

## 2019-04-27 MED ORDER — PROMETHAZINE HCL 25 MG PO TABS: 25.0000 mg | ORAL_TABLET | Freq: Three times a day (TID) | ORAL | 2 refills | Status: DC | PRN

## 2019-04-27 NOTE — Progress Notes (Signed)
Seth Weeks is a 58 y.o. male who presents to [facility] today for follow-up diabetes, hypertension, anxiety, and shoulder pain.  Diabetes: Patient currently taking Toujeo insulin 60 units daily, Jardiance 25, and Janumet Xr 50/1000 twice daily.  He had been not taking his medications previously but has restarted them.  He notes he is having a little nausea and is not sure if it is related to his medications.  His blood sugars are typically 120s to 250s usually in the 160s.  Hypertension and heart failure: Taking medications listed below.  Blood pressure typically pretty well controlled.  No chest pain palpitation shortness of breath.  Anxiety: Patient notes considerable anxiety especially around his work.  He finds his job very stressful and challenging.  He is in the process of dealing with a workers comp injury related to his work and finds that very stressful as well.  His anxiety is controlled with medication listed below and Xanax.  He did not restart Wellbutrin as recommended at last visit but is willing to restart it now.  He notes that Xanax does help him deal with stress at work which she finds very helpful.  Right shoulder pain: Patient suffered a right shoulder injury at work and is currently evaluated by occupational medicine for this.  He recently had MRI which showed rotator cuff tendinitis and tear.  He has a follow-up scheduled soon for this.  He notes his shoulder is quite painful at work especially with overhead lifting and pushing and pulling.   ROS as above:  Exam:  BP 128/87   Pulse 86   Wt 199 lb (90.3 kg)   BMI 30.26 kg/m  Wt Readings from Last 5 Encounters:  04/27/19 199 lb (90.3 kg)  03/16/19 184 lb (83.5 kg)  12/05/18 200 lb (90.7 kg)  11/30/18 200 lb (90.7 kg)  11/24/18 207 lb (93.9 kg)    Gen: Well NAD HEENT: EOMI,  MMM Lungs: Normal  work of breathing. CTABL Heart: RRR no MRG Abd: NABS, Soft. Nondistended, Nontender Exts: Brisk capillary refill, warm and well perfused.  Psych alert and oriented.  Slightly anxious affect.  Normal speech and thought process.  No active SI or HI. Depression screen [facility] 2/9 04/27/2019 11/24/2018 08/18/2018 03/17/2018 04/25/2017  Decreased Interest 3 3 2 3 3   Down, Depressed, Hopeless 3 2 3 3 2   PHQ - 2 Score 6 5 5 6 5   Altered sleeping 3 3 3 3 3   Tired, decreased energy 3 3 3 3 3   Change in appetite 3 1 1 3 2   Feeling bad or failure about yourself  3 3 3 3 3   Trouble concentrating 3 3 1 3 1   Moving slowly or fidgety/restless 3 2 0 3 1  Suicidal thoughts 3 2 1 3 1   PHQ-9 Score 27 22 17 27 19   Difficult doing work/chores Extremely dIfficult Very difficult Very difficult Extremely dIfficult -  Some recent data might be hidden   GAD 7 : Generalized Anxiety Score 04/27/2019 11/24/2018 08/18/2018 03/17/2018  Nervous, Anxious, on Edge 3 2 2 3   Control/stop worrying 3 2 3 3   Worry too much - different things 3 2 3 3   Trouble relaxing 3 3 2 3   Restless 1 2 1 3   Easily annoyed or irritable 3 2 1 3   Afraid - awful might happen 3 0 3 3  Total GAD 7 Score 19 13 15 21   Anxiety Difficulty  Extremely difficult Very difficult Very difficult Extremely difficult      Lab and Radiology Results No results found for this or any previous visit (from the past 58 hour(s)). No results found.    Assessment and Plan: 58 y.o. male with  Diabetes: Slightly better control.  Plan to continue current regimen.  Discussed trying stopping Janumet as below for nausea.  Recheck 1 month.  Nausea: Unclear etiology probably related to Janumet metformin component.  Recommend trying a few days off of Janumet and then a few days back on it and seeing if that has ended with his nausea.  If it does we will break out the Januvia component from the metformin and titrate metformin dose.  Hypertension and heart failure: Doing  reasonably well.  Continue current regimen.  Get basic labs listed below including lipid panel.  Anxiety: Stable but moderately controlled.  Plan to start Wellbutrin as below.  Also will continue Xanax.  Recheck 1 month. We will also schedule with Dr. Sheppard Coil in 2 months for his next follow-up after my follow-up in 1 months.  I informed patient that I am transitioning to sports medicine only [facility] in [place] starting in November.  Happy to see patient for continued sports medicine needs.  Discussed need for new PCP.  Provided some recommendations.   PDMP not reviewed this encounter. Orders Placed This Encounter  Procedures  . CBC  . COMPLETE METABOLIC PANEL WITH GFR  . Lipid Panel w/reflex Direct LDL   Meds ordered this encounter  Medications  . promethazine (PHENERGAN) 25 MG tablet    Sig: Take 1 tablet (25 mg total) by mouth every 8 (eight) hours as needed for nausea or vomiting.    Dispense:  30 tablet    Refill:  2  . ondansetron (ZOFRAN) 8 MG tablet    Sig: Take 0.5-1 tablets (4-8 mg total) by mouth every 8 (eight) hours as needed for nausea or vomiting.    Dispense:  30 tablet    Refill:  1  . nitroGLYCERIN (NITROSTAT) 0.4 MG SL tablet    Sig: Place 1 tablet (0.4 mg total) under the tongue every 5 (five) minutes as needed for chest pain.    Dispense:  50 tablet    Refill:  3     Historical information moved to improve visibility of documentation.  Past Medical History:  Diagnosis Date  . CHF (congestive heart failure) ([place]) 04/11/2018  . Diabetes mellitus ([place])   . GAD (generalized anxiety disorder) 07/17/2015  . High triglycerides   . Hypertension   . MDD (major depressive disorder) 03/17/2018   Past Surgical History:  Procedure Laterality Date  . TONSILLECTOMY     Social History   Tobacco Use  . Smoking status: Never Smoker  . Smokeless tobacco: Never Used  Substance Use Topics  . Alcohol use: Yes   family history includes Heart  failure in his mother.  Medications: Current Outpatient Medications  Medication Sig Dispense Refill  . alprazolam (XANAX) 2 MG tablet TAKE 1 TABLET BY MOUTH 3 TIMES A DAY AS NEEDED FOR ANXIETY - LAST FILLED 01/14/19 90 tablet 5  . AMBULATORY NON FORMULARY MEDICATION Single glucometer of choice with lancets, test strips. Test 3x daily.  E11.65 Disp QS 1 months 1 each 0  . atorvastatin (LIPITOR) 40 MG tablet Take 1 tablet (40 mg total) by mouth daily. 90 tablet 1  . buPROPion (WELLBUTRIN XL) 150 MG 24 hr tablet TAKE 1 TABLET BY MOUTH ONCE DAILY  IN THE MORNING 90 tablet 3  . Continuous Blood Gluc Sensor (FREESTYLE LIBRE 14 DAY SENSOR) MISC USE AS DIRECTED CHANGE  EVERY  14  DAYS 2 each 12  . empagliflozin (JARDIANCE) 25 MG TABS tablet Take 25 mg by mouth daily before breakfast. 90 tablet 1  . furosemide (LASIX) 40 MG tablet TAKE 1 TABLET BY MOUTH  DAILY 90 tablet 3  . gabapentin (NEURONTIN) 300 MG capsule One tab PO qHS for a week, then BID for a week, then TID. May double weekly to a max of 3,600mg /day 180 capsule 3  . Insulin Glargine, 2 Unit Dial, (TOUJEO MAX SOLOSTAR) 300 UNIT/ML SOPN Inject 60 Units as directed daily. 9 mL 3  . Insulin Pen Needle 34G X 3.5 MM MISC 1 each by Does not apply route daily. 100 each 12  . levocetirizine (XYZAL) 5 MG tablet Take 1 tablet (5 mg total) by mouth every evening. 90 tablet 1  . loperamide (IMODIUM A-D) 2 MG tablet Take 1 tablet (2 mg total) by mouth 4 (four) times daily as needed for diarrhea or loose stools. 90 tablet 1  . losartan (COZAAR) 25 MG tablet Take by mouth.    . metoprolol succinate (TOPROL-XL) 50 MG 24 hr tablet Take 50 mg by mouth daily.    . nitroGLYCERIN (NITROSTAT) 0.4 MG SL tablet Place 1 tablet (0.4 mg total) under the tongue every 5 (five) minutes as needed for chest pain. 50 tablet 3  . omeprazole (PRILOSEC) 40 MG capsule Take 1 capsule (40 mg total) by mouth daily. 90 capsule 3  . ondansetron (ZOFRAN) 8 MG tablet Take 0.5-1 tablets  (4-8 mg total) by mouth every 8 (eight) hours as needed for nausea or vomiting. 30 tablet 1  . promethazine (PHENERGAN) 25 MG tablet Take 1 tablet (25 mg total) by mouth every 8 (eight) hours as needed for nausea or vomiting. 30 tablet 2  . sitaGLIPtin-metformin (JANUMET) 50-1000 MG tablet TAKE 1 TABLET BY MOUTH TWICE DAILY WITH A MEAL 180 tablet 1   No current facility-administered medications for this visit.    Allergies  Allergen Reactions  . Bee Venom Anaphylaxis  . Clindamycin/Lincomycin Diarrhea    Bad diarrhea. Please do not give.   . Pregabalin Other (See Comments)    expensive    . Penicillins   . Terramycin [Oxytetracycline]      Discussed warning signs or symptoms. Please see discharge instructions. Patient expresses understanding.

## 2019-04-27 NOTE — Patient Instructions (Addendum)
Thank you for coming in today. I suspect that the Janumet may be causing the nausea.  Try stopping the janumet for a few days and then restart it and let me know.  Start wellbutrin.  Recheck in 1 month.

## 2019-05-11 ENCOUNTER — Other Ambulatory Visit: Payer: Self-pay

## 2019-05-11 ENCOUNTER — Ambulatory Visit (INDEPENDENT_AMBULATORY_CARE_PROVIDER_SITE_OTHER): Payer: BC Managed Care – PPO | Admitting: Podiatry

## 2019-05-11 DIAGNOSIS — E1165 Type 2 diabetes mellitus with hyperglycemia: Secondary | ICD-10-CM | POA: Diagnosis not present

## 2019-05-11 DIAGNOSIS — B351 Tinea unguium: Secondary | ICD-10-CM | POA: Diagnosis not present

## 2019-05-11 DIAGNOSIS — M79674 Pain in right toe(s): Secondary | ICD-10-CM

## 2019-05-11 DIAGNOSIS — M79675 Pain in left toe(s): Secondary | ICD-10-CM | POA: Diagnosis not present

## 2019-05-12 ENCOUNTER — Encounter: Payer: Self-pay | Admitting: Podiatry

## 2019-05-12 NOTE — Progress Notes (Signed)
  Subjective:  Patient ID: Seth Weeks, male    DOB: [birth year],  MRN: [id number]  Chief Complaint  Patient presents with  . Nail Problem    pt is here for a diabetic nail trim, pt states that he has some ingrown toenails digging into his feet as well   58 y.o. male returns for the above complaint.   Objective:  There were no vitals filed for this visit. Podiatric Exam: Vascular: dorsalis pedis and posterior tibial pulses are palpable bilateral. Capillary return is immediate. Temperature gradient is WNL. Skin turgor WNL  Sensorium: Normal Semmes Weinstein monofilament test. Normal tactile sensation bilaterally. Nail Exam: Pt has thick disfigured discolored nails with subungual debris noted bilateral entire nail hallux through fifth toenails Ulcer Exam: There is no evidence of ulcer or pre-ulcerative changes or infection. Orthopedic Exam: Muscle tone and strength are WNL. No limitations in general ROM. No crepitus or effusions noted. HAV  B/L.  Hammer toes 2-5  B/L. Skin: No Porokeratosis. No infection or ulcers  Assessment & Plan:  Patient was evaluated and treated and all questions answered.  Onychomycosis with pain  -Nails palliatively debrided as below. -Educated on self-care  Procedure: Nail Debridement Rationale: pain  Type of Debridement: manual, sharp debridement. Instrumentation: Nail nipper, rotary burr. Number of Nails: 10  Procedures and Treatment: Consent by patient was obtained for treatment procedures. The patient understood the discussion of treatment and procedures well. All questions were answered thoroughly reviewed. Debridement of mycotic and hypertrophic toenails, 1 through 5 bilateral and clearing of subungual debris. No ulceration, no infection noted.  Return Visit-Office Procedure: Patient instructed to return to the office for a follow up visit 3 months for continued evaluation and treatment.  Boneta Lucks, DPM    No follow-ups on file.

## 2019-05-20 ENCOUNTER — Other Ambulatory Visit: Payer: Self-pay | Admitting: Family Medicine

## 2019-05-22 DIAGNOSIS — I5022 Chronic systolic (congestive) heart failure: Secondary | ICD-10-CM | POA: Diagnosis not present

## 2019-05-22 DIAGNOSIS — Z794 Long term (current) use of insulin: Secondary | ICD-10-CM | POA: Diagnosis not present

## 2019-05-22 DIAGNOSIS — I2510 Atherosclerotic heart disease of native coronary artery without angina pectoris: Secondary | ICD-10-CM | POA: Diagnosis not present

## 2019-05-22 DIAGNOSIS — E1140 Type 2 diabetes mellitus with diabetic neuropathy, unspecified: Secondary | ICD-10-CM | POA: Diagnosis not present

## 2019-05-23 DIAGNOSIS — E1165 Type 2 diabetes mellitus with hyperglycemia: Secondary | ICD-10-CM | POA: Diagnosis not present

## 2019-05-23 DIAGNOSIS — E1141 Type 2 diabetes mellitus with diabetic mononeuropathy: Secondary | ICD-10-CM | POA: Diagnosis not present

## 2019-05-23 DIAGNOSIS — G894 Chronic pain syndrome: Secondary | ICD-10-CM | POA: Diagnosis not present

## 2019-05-23 DIAGNOSIS — M47812 Spondylosis without myelopathy or radiculopathy, cervical region: Secondary | ICD-10-CM | POA: Diagnosis not present

## 2019-05-23 DIAGNOSIS — E1142 Type 2 diabetes mellitus with diabetic polyneuropathy: Secondary | ICD-10-CM | POA: Diagnosis not present

## 2019-05-23 LAB — COMPLETE METABOLIC PANEL WITH GFR
AG Ratio: 1.3 (calc) (ref 1.0–2.5)
ALT: 28 U/L (ref 9–46)
AST: 21 U/L (ref 10–35)
Albumin: 3.8 g/dL (ref 3.6–5.1)
Alkaline phosphatase (APISO): 75 U/L (ref 35–144)
BUN: 18 mg/dL (ref 7–25)
CO2: 26 mmol/L (ref 20–32)
Calcium: 9 mg/dL (ref 8.6–10.3)
Chloride: 96 mmol/L — ABNORMAL LOW (ref 98–110)
Creat: 1.13 mg/dL (ref 0.70–1.33)
GFR, Est African American: 83 mL/min/{1.73_m2} (ref >=60)
GFR, Est Non African American: 71 mL/min/{1.73_m2} (ref >=60)
Globulin: 2.9 g/dL (calc) (ref 1.9–3.7)
Glucose, Bld: 547 mg/dL (ref 65–99)
Potassium: 4.6 mmol/L (ref 3.5–5.3)
Sodium: 131 mmol/L — ABNORMAL LOW (ref 135–146)
Total Bilirubin: 0.5 mg/dL (ref 0.2–1.2)
Total Protein: 6.7 g/dL (ref 6.1–8.1)

## 2019-05-23 LAB — LIPID PANEL W/REFLEX DIRECT LDL
Cholesterol: 189 mg/dL (ref <200)
HDL: 37 mg/dL — ABNORMAL LOW (ref >=40)
LDL Cholesterol (Calc): 105 mg/dL (calc) — ABNORMAL HIGH
Non-HDL Cholesterol (Calc): 152 mg/dL (calc) — ABNORMAL HIGH (ref <130)
Total CHOL/HDL Ratio: 5.1 (calc) — ABNORMAL HIGH (ref <5.0)
Triglycerides: 355 mg/dL — ABNORMAL HIGH (ref <150)

## 2019-05-23 LAB — CBC
HCT: 46.8 % (ref 38.5–50.0)
Hemoglobin: 15 g/dL (ref 13.2–17.1)
MCH: 26.5 pg — ABNORMAL LOW (ref 27.0–33.0)
MCHC: 32.1 g/dL (ref 32.0–36.0)
MCV: 82.8 fL (ref 80.0–100.0)
MPV: 10.9 fL (ref 7.5–12.5)
Platelets: 183 10*3/uL (ref 140–400)
RBC: 5.65 10*6/uL (ref 4.20–5.80)
RDW: 13.7 % (ref 11.0–15.0)
WBC: 6.8 10*3/uL (ref 3.8–10.8)

## 2019-05-25 ENCOUNTER — Ambulatory Visit: Payer: BC Managed Care – PPO | Admitting: Family Medicine

## 2019-05-29 ENCOUNTER — Other Ambulatory Visit: Payer: Self-pay | Admitting: Family Medicine

## 2019-06-11 ENCOUNTER — Other Ambulatory Visit: Payer: Self-pay | Admitting: Family Medicine

## 2019-06-12 ENCOUNTER — Other Ambulatory Visit: Payer: Self-pay | Admitting: Family Medicine

## 2019-07-06 ENCOUNTER — Ambulatory Visit: Payer: BC Managed Care – PPO | Admitting: Osteopathic Medicine

## 2019-07-12 ENCOUNTER — Other Ambulatory Visit: Payer: Self-pay | Admitting: Family Medicine

## 2019-07-13 MED ORDER — ONDANSETRON HCL 8 MG PO TABS: 4.0000 mg | ORAL_TABLET | Freq: Three times a day (TID) | ORAL | 0 refills | Status: DC | PRN

## 2019-07-13 NOTE — Telephone Encounter (Signed)
Called patient and notified med was sent. KG LPN

## 2019-07-13 NOTE — Telephone Encounter (Signed)
Call patient: We did refill his Zofran for 30 tabs.  But he actually should have enough promethazine until the end of the month until after his appointment with Dr. Sheppard Coil.

## 2019-07-13 NOTE — Telephone Encounter (Signed)
Call pt: Dr. Georgina Snell wrote for aot of nausea medication so if he is needing refills then needs referral to GI for further work up of his nausea.  May be having some complications from his diabetes affecting his stomach. If ok for referral pleaes place to [facility] [place]

## 2019-07-13 NOTE — Telephone Encounter (Signed)
Called patient and he states he has appointment with Sheppard Coil 12/30 to establish care with her, but he lost his job now and does not have health insurance. Staes he really needs the meds for nausea but understands if you will not fill them and he will as he states " have to deal with nausea until seen".  Please advise  KG LPN

## 2019-07-23 ENCOUNTER — Emergency Department (INDEPENDENT_AMBULATORY_CARE_PROVIDER_SITE_OTHER)
Admission: EM | Admit: 2019-07-23 | Discharge: 2019-07-23 | Disposition: A | Discharge status: Home / Self Care | Payer: BC Managed Care – PPO | Source: Home / Self Care

## 2019-07-23 ENCOUNTER — Other Ambulatory Visit: Payer: Self-pay

## 2019-07-23 ENCOUNTER — Encounter: Payer: Self-pay | Admitting: Family Medicine

## 2019-07-23 DIAGNOSIS — R5383 Other fatigue: Secondary | ICD-10-CM

## 2019-07-23 DIAGNOSIS — I509 Heart failure, unspecified: Secondary | ICD-10-CM

## 2019-07-23 MED ORDER — TORSEMIDE 10 MG PO TABS: 10.0000 mg | ORAL_TABLET | Freq: Two times a day (BID) | ORAL | 1 refills | Status: DC

## 2019-07-23 NOTE — ED Provider Notes (Signed)
[name]    CSN: [id number] Arrival date & time: 07/23/19  1548      History   Chief Complaint Chief Complaint  Patient presents with  . Nausea    HPI Seth Weeks is a 58 y.o. male.   This is an established [facility] urgent care patient.  Patient is complaining about swollen legs, shortness of breath, fatigue, nausea, and dehydration.  Patient feels like he did a year ago when he went into heart failure.  When he lies down he feels like he is smothering.  Patient's had no recent cold symptoms or cough or loss of sense of smell.  He has had no vomiting.     Past Medical History:  Diagnosis Date  . CHF (congestive heart failure) ([place]) 04/11/2018  . Diabetes mellitus ([place])   . GAD (generalized anxiety disorder) 07/17/2015  . High triglycerides   . Hypertension   . MDD (major depressive disorder) 03/17/2018    Patient Active Problem List   Diagnosis Date Noted  . Onychomycosis of multiple toenails with type 2 diabetes mellitus ([place]) 11/30/2018  . Hypertension associated with diabetes ([place]) 11/24/2018  . Coronary artery disease involving native coronary artery of native heart without angina pectoris 04/14/2018  . Chronic systolic congestive heart failure ([place]) 04/11/2018  . Nonischemic cardiomyopathy ([place]) 04/11/2018  . Pericardial effusion 04/11/2018  . MDD (major depressive disorder) 03/17/2018  . Metatarsalgia 03/17/2018  . Carpal tunnel syndrome 03/17/2018  . Peripheral neuropathy 06/16/2017  . Type 2 diabetes mellitus with diabetic neuropathy, unspecified ([place]) 06/16/2017  . Chronic pain 04/26/2017  . Bilateral tennis elbow 03/22/2017  . Skin lesion 03/03/2016  . GAD (generalized anxiety disorder) 07/17/2015  . Pes cavus 01/28/2015  . Umbilical hernia 50/93/2671  . Benign hypertension with coincident congestive heart failure ([place]) 10/19/2013  . BPV (benign positional vertigo) 07/23/2013  . Erectile dysfunction 05/25/2013    Past Surgical  History:  Procedure Laterality Date  . TONSILLECTOMY         Home Medications    Prior to Admission medications   Medication Sig Start Date End Date Taking? Authorizing Provider  alprazolam Duanne Moron) 2 MG tablet TAKE 1 TABLET BY MOUTH 3 TIMES A DAY AS NEEDED FOR ANXIETY - LAST FILLED 01/14/19 04/03/19   Gregor Hams, MD  AMBULATORY NON FORMULARY MEDICATION Single glucometer of choice with lancets, test strips. Test 3x daily.  E11.65 Disp QS 1 months 05/20/17   Gregor Hams, MD  aspirin EC 81 MG tablet Take by mouth. 03/16/19 03/15/20  [provider]  atorvastatin (LIPITOR) 40 MG tablet Take 1 tablet (40 mg total) by mouth daily. 02/06/19   Gregor Hams, MD  buPROPion (WELLBUTRIN XL) 150 MG 24 hr tablet TAKE 1 TABLET BY MOUTH ONCE DAILY IN THE MORNING 03/28/19   Gregor Hams, MD  chlorhexidine (PERIDEX) 0.12 % solution  04/18/19   [provider]  Continuous Blood Gluc Sensor (FREESTYLE LIBRE 14 DAY SENSOR) MISC USE AS DIRECTED CHANGE  EVERY  14  DAYS 03/20/19   Gregor Hams, MD  furosemide (LASIX) 40 MG tablet TAKE 1 TABLET BY MOUTH  DAILY 03/28/19   Gregor Hams, MD  Insulin Glargine, 2 Unit Dial, (TOUJEO MAX SOLOSTAR) 300 UNIT/ML SOPN Inject 60 Units as directed daily. 03/16/19   Gregor Hams, MD  Insulin Pen Needle 34G X 3.5 MM MISC 1 each by Does not apply route daily. 11/24/18   Gregor Hams, MD  JANUMET 50-1000 MG  tablet TAKE 1 TABLET BY MOUTH  TWICE DAILY WITH A MEAL 06/11/19   Breeback, Jade L, PA-C  levocetirizine (XYZAL) 5 MG tablet Take 1 tablet (5 mg total) by mouth every evening. 06/16/18   Gregor Hams, MD  loperamide (IMODIUM A-D) 2 MG tablet Take 1 tablet (2 mg total) by mouth 4 (four) times daily as needed for diarrhea or loose stools. 06/16/18   Gregor Hams, MD  losartan (COZAAR) 25 MG tablet Take by mouth. 03/16/19   [provider]  metoprolol succinate (TOPROL-XL) 50 MG 24 hr tablet Take 50 mg by mouth daily. 04/18/18   [provider]    Multiple Vitamin (MULTI-VITAMIN) tablet Take by mouth.    [provider]  NARCAN 4 MG/0.1ML LIQD nasal spray kit  12/16/18   [provider]  nitroGLYCERIN (NITROSTAT) 0.4 MG SL tablet PLACE 1 TABLET (0.4 MG TOTAL) UNDER THE TONGUE EVERY 5 (FIVE) MINUTES AS NEEDED FOR CHEST PAIN. 06/12/19   Silverio Decamp, MD  omeprazole (PRILOSEC) 40 MG capsule Take 1 capsule (40 mg total) by mouth daily. 03/16/19   Gregor Hams, MD  ondansetron (ZOFRAN) 8 MG tablet Take 0.5-1 tablets (4-8 mg total) by mouth every 8 (eight) hours as needed for nausea or vomiting. 07/13/19   Hali Marry, MD  promethazine (PHENERGAN) 25 MG tablet Take 1 tablet (25 mg total) by mouth every 8 (eight) hours as needed for nausea or vomiting. 04/27/19   Gregor Hams, MD  torsemide (DEMADEX) 10 MG tablet Take 1 tablet (10 mg total) by mouth 2 (two) times daily. 07/23/19   Robyn Haber, MD    Family History Family History  Problem Relation Age of Onset  . Heart failure Mother     Social History Social History   Tobacco Use  . Smoking status: Never Smoker  . Smokeless tobacco: Never Used  Substance Use Topics  . Alcohol use: Yes  . Drug use: No     Allergies   Bee venom, Clindamycin/lincomycin, Pregabalin, Penicillins, and Terramycin [oxytetracycline]   Review of Systems Review of Systems  Constitutional: Positive for fatigue.  Respiratory: Positive for shortness of breath.   Cardiovascular: Positive for leg swelling. Negative for chest pain.  Gastrointestinal: Positive for abdominal distention and nausea. Negative for abdominal pain.  Neurological: Positive for weakness.     Physical Exam Triage Vital Signs ED Triage Vitals  Enc Vitals Group     BP      Pulse      Resp      Temp      Temp src      SpO2      Weight      Height      Head Circumference      Peak Flow      Pain Score      Pain Loc      Pain Edu?      Excl. in [place]?    No data found.  Updated  Vital Signs BP 95/73 (BP Location: Right Arm)   Pulse 87   Temp 97.8 F (36.6 C) (Oral)   Ht '5\' 8"'  (1.727 m)   Wt 90.7 kg   SpO2 98%   BMI 30.41 kg/m    Physical Exam Vitals and nursing note reviewed.  Constitutional:      Appearance: Normal appearance. He is obese. He is ill-appearing.  HENT:     Mouth/Throat:     Pharynx: Oropharynx is clear.  Eyes:     Conjunctiva/sclera: Conjunctivae normal.  Cardiovascular:     Rate and Rhythm: Normal rate.     Heart sounds: Normal heart sounds.  Pulmonary:     Breath sounds: Rales present.     Comments: Increased respiratory effort  Bibasilar fine rales  Abdominal:     General: There is distension.  Musculoskeletal:        General: Normal range of motion.     Cervical back: Normal range of motion and neck supple.     Right lower leg: Edema present.     Left lower leg: Edema present.  Skin:    General: Skin is warm and dry.  Neurological:     General: No focal deficit present.     Mental Status: He is alert.  Psychiatric:        Mood and Affect: Mood normal.      UC Treatments / Results  Labs (all labs ordered are listed, but only abnormal results are displayed) Labs Reviewed  BRAIN NATRIURETIC PEPTIDE  COMPLETE METABOLIC PANEL WITH GFR  CBC WITH DIFFERENTIAL/PLATELET    EKG   Radiology No results found.  Procedures Procedures (including critical care time)  Medications Ordered in UC Medications - No data to display  Initial Impression / Assessment and Plan / UC Course  I have reviewed the triage vital signs and the nursing notes.  Pertinent labs & imaging results that were available during my care of the patient were reviewed by me and considered in my medical decision making (see chart for details).    Final Clinical Impressions(s) / UC Diagnoses   Final diagnoses:  Fatigue, unspecified type  Acute on chronic congestive heart failure, unspecified heart failure type [facility])     Discharge  Instructions     We are running some lab test to try to better understand why you are having congestive heart failure.  When you go home and start the diuretic called Demadex twice a day.  If your symptoms are worsening or failing to improve in 24 hours, please go to the emergency room.    ED Prescriptions    Medication Sig Dispense Auth. Provider   torsemide (DEMADEX) 10 MG tablet Take 1 tablet (10 mg total) by mouth 2 (two) times daily. 14 tablet Robyn Haber, MD     I have reviewed the PDMP during this encounter.   Robyn Haber, MD 07/23/19 1642

## 2019-07-23 NOTE — ED Triage Notes (Signed)
Nausea, fatigue, dehydrated, swollen legs, x 1 week

## 2019-07-23 NOTE — Discharge Instructions (Addendum)
We are running some lab test to try to better understand why you are having congestive heart failure.  When you go home and start the diuretic called Demadex twice a day.  If your symptoms are worsening or failing to improve in 24 hours, please go to the emergency room.

## 2019-07-24 DIAGNOSIS — N179 Acute kidney failure, unspecified: Secondary | ICD-10-CM | POA: Insufficient documentation

## 2019-07-24 DIAGNOSIS — R778 Other specified abnormalities of plasma proteins: Secondary | ICD-10-CM | POA: Insufficient documentation

## 2019-07-24 LAB — CBC WITH DIFFERENTIAL/PLATELET
Absolute Monocytes: 931 cells/uL (ref 200–950)
Basophils Absolute: 29 cells/uL (ref 0–200)
Basophils Relative: 0.3 %
Eosinophils Absolute: 19 cells/uL (ref 15–500)
Eosinophils Relative: 0.2 %
HCT: 49.5 % (ref 38.5–50.0)
Hemoglobin: 16.1 g/dL (ref 13.2–17.1)
Lymphs Abs: 1229 cells/uL (ref 850–3900)
MCH: 26.2 pg — ABNORMAL LOW (ref 27.0–33.0)
MCHC: 32.5 g/dL (ref 32.0–36.0)
MCV: 80.6 fL (ref 80.0–100.0)
MPV: 12.1 fL (ref 7.5–12.5)
Monocytes Relative: 9.7 %
Neutro Abs: 7392 cells/uL (ref 1500–7800)
Neutrophils Relative %: 77 %
Platelets: 212 10*3/uL (ref 140–400)
RBC: 6.14 10*6/uL — ABNORMAL HIGH (ref 4.20–5.80)
RDW: 16.6 % — ABNORMAL HIGH (ref 11.0–15.0)
Total Lymphocyte: 12.8 %
WBC: 9.6 10*3/uL (ref 3.8–10.8)

## 2019-07-24 LAB — COMPLETE METABOLIC PANEL WITH GFR
AG Ratio: 1.4 (calc) (ref 1.0–2.5)
ALT: 65 U/L — ABNORMAL HIGH (ref 9–46)
AST: 76 U/L — ABNORMAL HIGH (ref 10–35)
Albumin: 3.4 g/dL — ABNORMAL LOW (ref 3.6–5.1)
Alkaline phosphatase (APISO): 74 U/L (ref 35–144)
BUN/Creatinine Ratio: 15 (calc) (ref 6–22)
BUN: 36 mg/dL — ABNORMAL HIGH (ref 7–25)
CO2: 23 mmol/L (ref 20–32)
Calcium: 8.5 mg/dL — ABNORMAL LOW (ref 8.6–10.3)
Chloride: 97 mmol/L — ABNORMAL LOW (ref 98–110)
Creat: 2.39 mg/dL — ABNORMAL HIGH (ref 0.70–1.33)
GFR, Est African American: 33 mL/min/{1.73_m2} — ABNORMAL LOW (ref >=60)
GFR, Est Non African American: 29 mL/min/{1.73_m2} — ABNORMAL LOW (ref >=60)
Globulin: 2.4 g/dL (calc) (ref 1.9–3.7)
Glucose, Bld: 187 mg/dL — ABNORMAL HIGH (ref 65–99)
Potassium: 5.5 mmol/L — ABNORMAL HIGH (ref 3.5–5.3)
Sodium: 134 mmol/L — ABNORMAL LOW (ref 135–146)
Total Bilirubin: 1.4 mg/dL — ABNORMAL HIGH (ref 0.2–1.2)
Total Protein: 5.8 g/dL — ABNORMAL LOW (ref 6.1–8.1)

## 2019-07-24 LAB — BRAIN NATRIURETIC PEPTIDE: Brain Natriuretic Peptide: 798 pg/mL — ABNORMAL HIGH (ref <100)

## 2019-07-25 ENCOUNTER — Telehealth: Payer: Self-pay | Admitting: Emergency Medicine

## 2019-07-25 NOTE — Telephone Encounter (Signed)
Attempted to reach patient regarding lab work. No answer at this time. Voicemail left.

## 2019-07-26 ENCOUNTER — Telehealth: Payer: Self-pay | Admitting: Emergency Medicine

## 2019-07-26 DIAGNOSIS — J9601 Acute respiratory failure with hypoxia: Secondary | ICD-10-CM | POA: Insufficient documentation

## 2019-07-26 NOTE — Telephone Encounter (Signed)
Attempted to reach patient x2. No answer at this time. No number on file is correct. Letter sent.

## 2019-07-28 MED ORDER — SENNOSIDES-DOCUSATE SODIUM 8.6-50 MG PO TABS
1.00 | ORAL_TABLET | ORAL | Status: DC
Start: ? — End: 2019-07-28

## 2019-07-28 MED ORDER — ACETAMINOPHEN 325 MG PO TABS
650.00 | ORAL_TABLET | ORAL | Status: DC
Start: ? — End: 2019-07-28

## 2019-07-28 MED ORDER — ASPIRIN 81 MG PO TBEC
81.00 | DELAYED_RELEASE_TABLET | ORAL | Status: DC
Start: 2019-07-28 — End: 2019-07-28

## 2019-07-28 MED ORDER — SODIUM CHLORIDE 0.9 % IV SOLN
10.00 | INTRAVENOUS | Status: DC
Start: ? — End: 2019-07-28

## 2019-07-28 MED ORDER — HYDROCODONE-ACETAMINOPHEN 10-325 MG PO TABS
1.00 | ORAL_TABLET | ORAL | Status: DC
Start: ? — End: 2019-07-28

## 2019-07-28 MED ORDER — DSS 100 MG PO CAPS
100.00 | ORAL_CAPSULE | ORAL | Status: DC
Start: 2019-07-28 — End: 2019-07-28

## 2019-07-28 MED ORDER — INSULIN GLARGINE 100 UNIT/ML ~~LOC~~ SOLN
1.00 | SUBCUTANEOUS | Status: DC
Start: ? — End: 2019-07-28

## 2019-07-28 MED ORDER — HEPARIN SODIUM (PORCINE) 5000 UNIT/ML IJ SOLN
5000.00 | INTRAMUSCULAR | Status: DC
Start: 2019-07-28 — End: 2019-07-28

## 2019-07-28 MED ORDER — GENERIC EXTERNAL MEDICATION
10.00 | Status: DC
Start: ? — End: 2019-07-28

## 2019-07-28 MED ORDER — PANTOPRAZOLE SODIUM 40 MG PO TBEC
40.00 | DELAYED_RELEASE_TABLET | ORAL | Status: DC
Start: 2019-07-28 — End: 2019-07-28

## 2019-07-28 MED ORDER — ALBUTEROL SULFATE (2.5 MG/3ML) 0.083% IN NEBU
2.50 | INHALATION_SOLUTION | RESPIRATORY_TRACT | Status: DC
Start: ? — End: 2019-07-28

## 2019-07-28 MED ORDER — HYDROCODONE-ACETAMINOPHEN 5-325 MG PO TABS
1.00 | ORAL_TABLET | ORAL | Status: DC
Start: ? — End: 2019-07-28

## 2019-07-28 MED ORDER — INSULIN LISPRO 100 UNIT/ML ~~LOC~~ SOLN
1.00 | SUBCUTANEOUS | Status: DC
Start: ? — End: 2019-07-28

## 2019-07-28 MED ORDER — GENERIC EXTERNAL MEDICATION
Status: DC
Start: ? — End: 2019-07-28

## 2019-07-28 MED ORDER — ALUM & MAG HYDROXIDE-SIMETH 200-200-20 MG/5ML PO SUSP
30.00 | ORAL | Status: DC
Start: ? — End: 2019-07-28

## 2019-07-28 MED ORDER — ATORVASTATIN CALCIUM 20 MG PO TABS
40.00 | ORAL_TABLET | ORAL | Status: DC
Start: 2019-07-28 — End: 2019-07-28

## 2019-07-28 MED ORDER — INSULIN GLARGINE 100 UNIT/ML ~~LOC~~ SOLN
1.00 | SUBCUTANEOUS | Status: DC
Start: 2019-07-28 — End: 2019-07-28

## 2019-07-28 MED ORDER — MUPIROCIN 2 % EX OINT
TOPICAL_OINTMENT | CUTANEOUS | Status: DC
Start: 2019-07-28 — End: 2019-07-28

## 2019-07-28 MED ORDER — LABETALOL HCL 5 MG/ML IV SOLN
20.00 | INTRAVENOUS | Status: DC
Start: ? — End: 2019-07-28

## 2019-07-28 MED ORDER — DOPAMINE IN D5W 1.6-5 MG/ML-% IV SOLN
2.00 | INTRAVENOUS | Status: DC
Start: ? — End: 2019-07-28

## 2019-07-28 MED ORDER — NITROGLYCERIN 0.4 MG SL SUBL
0.40 | SUBLINGUAL_TABLET | SUBLINGUAL | Status: DC
Start: ? — End: 2019-07-28

## 2019-07-28 MED ORDER — ACETAMINOPHEN 650 MG RE SUPP
650.00 | RECTAL | Status: DC
Start: ? — End: 2019-07-28

## 2019-07-28 MED ORDER — BUPROPION HCL ER (XL) 150 MG PO TB24
150.00 | ORAL_TABLET | ORAL | Status: DC
Start: 2019-07-28 — End: 2019-07-28

## 2019-07-28 MED ORDER — INSULIN LISPRO 100 UNIT/ML ~~LOC~~ SOLN
1.00 | SUBCUTANEOUS | Status: DC
Start: 2019-07-28 — End: 2019-07-28

## 2019-07-28 MED ORDER — OXYCODONE HCL 5 MG PO TABS
5.00 | ORAL_TABLET | ORAL | Status: DC
Start: ? — End: 2019-07-28

## 2019-08-01 ENCOUNTER — Ambulatory Visit: Payer: BC Managed Care – PPO | Admitting: Osteopathic Medicine

## 2019-08-01 MED ORDER — SENNOSIDES-DOCUSATE SODIUM 8.6-50 MG PO TABS
1.00 | ORAL_TABLET | ORAL | Status: DC
Start: ? — End: 2019-08-01

## 2019-08-01 MED ORDER — SODIUM CHLORIDE 0.9 % IV SOLN
10.00 | INTRAVENOUS | Status: DC
Start: ? — End: 2019-08-01

## 2019-08-01 MED ORDER — FUROSEMIDE 40 MG PO TABS
40.00 | ORAL_TABLET | ORAL | Status: DC
Start: ? — End: 2019-08-01

## 2019-08-01 MED ORDER — ALBUTEROL SULFATE (2.5 MG/3ML) 0.083% IN NEBU
2.50 | INHALATION_SOLUTION | RESPIRATORY_TRACT | Status: DC
Start: ? — End: 2019-08-01

## 2019-08-01 MED ORDER — GENERIC EXTERNAL MEDICATION
Status: DC
Start: ? — End: 2019-08-01

## 2019-08-01 MED ORDER — BUPROPION HCL ER (XL) 150 MG PO TB24
150.00 | ORAL_TABLET | ORAL | Status: DC
Start: 2019-08-01 — End: 2019-08-01

## 2019-08-01 MED ORDER — LABETALOL HCL 5 MG/ML IV SOLN
20.00 | INTRAVENOUS | Status: DC
Start: ? — End: 2019-08-01

## 2019-08-01 MED ORDER — HYDROCODONE-ACETAMINOPHEN 10-325 MG PO TABS
1.00 | ORAL_TABLET | ORAL | Status: DC
Start: ? — End: 2019-08-01

## 2019-08-01 MED ORDER — OXYCODONE HCL 5 MG PO TABS
5.00 | ORAL_TABLET | ORAL | Status: DC
Start: ? — End: 2019-08-01

## 2019-08-01 MED ORDER — ACETAMINOPHEN 650 MG RE SUPP
650.00 | RECTAL | Status: DC
Start: ? — End: 2019-08-01

## 2019-08-01 MED ORDER — NITROGLYCERIN 0.4 MG SL SUBL
0.40 | SUBLINGUAL_TABLET | SUBLINGUAL | Status: DC
Start: ? — End: 2019-08-01

## 2019-08-01 MED ORDER — INSULIN LISPRO 100 UNIT/ML ~~LOC~~ SOLN
1.00 | SUBCUTANEOUS | Status: DC
Start: 2019-07-31 — End: 2019-08-01

## 2019-08-01 MED ORDER — INSULIN LISPRO 100 UNIT/ML ~~LOC~~ SOLN
1.00 | SUBCUTANEOUS | Status: DC
Start: ? — End: 2019-08-01

## 2019-08-01 MED ORDER — INSULIN GLARGINE 100 UNIT/ML ~~LOC~~ SOLN
1.00 | SUBCUTANEOUS | Status: DC
Start: 2019-07-31 — End: 2019-08-01

## 2019-08-01 MED ORDER — ACETAMINOPHEN 325 MG PO TABS
650.00 | ORAL_TABLET | ORAL | Status: DC
Start: ? — End: 2019-08-01

## 2019-08-01 MED ORDER — ALUM & MAG HYDROXIDE-SIMETH 200-200-20 MG/5ML PO SUSP
30.00 | ORAL | Status: DC
Start: ? — End: 2019-08-01

## 2019-08-01 MED ORDER — HYDROCODONE-ACETAMINOPHEN 5-325 MG PO TABS
1.00 | ORAL_TABLET | ORAL | Status: DC
Start: ? — End: 2019-08-01

## 2019-08-01 MED ORDER — PANTOPRAZOLE SODIUM 40 MG PO TBEC
40.00 | DELAYED_RELEASE_TABLET | ORAL | Status: DC
Start: 2019-08-01 — End: 2019-08-01

## 2019-08-01 MED ORDER — DSS 100 MG PO CAPS
100.00 | ORAL_CAPSULE | ORAL | Status: DC
Start: 2019-07-31 — End: 2019-08-01

## 2019-08-01 MED ORDER — HEPARIN SODIUM (PORCINE) 5000 UNIT/ML IJ SOLN
5000.00 | INTRAMUSCULAR | Status: DC
Start: 2019-07-31 — End: 2019-08-01

## 2019-08-01 MED ORDER — SALINE NASAL SPRAY 0.65 % NA SOLN
1.00 | NASAL | Status: DC
Start: ? — End: 2019-08-01

## 2019-08-01 MED ORDER — ASPIRIN 81 MG PO TBEC
81.00 | DELAYED_RELEASE_TABLET | ORAL | Status: DC
Start: 2019-08-01 — End: 2019-08-01

## 2019-08-01 MED ORDER — INSULIN GLARGINE 100 UNIT/ML ~~LOC~~ SOLN
1.00 | SUBCUTANEOUS | Status: DC
Start: ? — End: 2019-08-01

## 2019-08-01 MED ORDER — METOPROLOL SUCCINATE ER 25 MG PO TB24
25.00 | ORAL_TABLET | ORAL | Status: DC
Start: 2019-08-01 — End: 2019-08-01

## 2019-08-02 ENCOUNTER — Other Ambulatory Visit: Payer: Self-pay

## 2019-08-02 ENCOUNTER — Encounter: Payer: Self-pay | Admitting: Nurse Practitioner

## 2019-08-02 ENCOUNTER — Ambulatory Visit (INDEPENDENT_AMBULATORY_CARE_PROVIDER_SITE_OTHER): Payer: Self-pay | Admitting: Nurse Practitioner

## 2019-08-02 VITALS — BP 111/74 | HR 97 | Temp 98.2°F | Ht 68.0 in | Wt 188.0 lb

## 2019-08-02 DIAGNOSIS — N179 Acute kidney failure, unspecified: Secondary | ICD-10-CM

## 2019-08-02 DIAGNOSIS — J9601 Acute respiratory failure with hypoxia: Secondary | ICD-10-CM

## 2019-08-02 DIAGNOSIS — Z09 Encounter for follow-up examination after completed treatment for conditions other than malignant neoplasm: Secondary | ICD-10-CM

## 2019-08-02 DIAGNOSIS — I110 Hypertensive heart disease with heart failure: Secondary | ICD-10-CM

## 2019-08-02 DIAGNOSIS — I5022 Chronic systolic (congestive) heart failure: Secondary | ICD-10-CM

## 2019-08-02 DIAGNOSIS — E1142 Type 2 diabetes mellitus with diabetic polyneuropathy: Secondary | ICD-10-CM

## 2019-08-02 MED ORDER — GENERIC EXTERNAL MEDICATION
Status: DC
Start: ? — End: 2019-08-02

## 2019-08-02 NOTE — Assessment & Plan Note (Signed)
Lungs clear bilaterally with no respiratory distress present. Continue current medication and diet regimen recommended by the hospital. Appt with Dr. Sheppard Coil scheduled 08/29/19

## 2019-08-02 NOTE — Patient Instructions (Addendum)
Continue following the reduced sodium and carbohydrate diet.   Monitor swelling in your extremities and your breathing- if you develop symptoms again, please seek care immediately.   Send me a message through mychart if you need refills on your medication. I will work this weekend on trying to find a program that may be able to help with the cost of your diabetes medication.   Monitor your blood sugar.

## 2019-08-02 NOTE — Assessment & Plan Note (Addendum)
Lab results upon discharge yesterday showed slightly improving kidney function. Plan to reorder labs at 08/29/19 appt with Dr. Sheppard Coil to assess progression. Will need to address DM2 management options in the setting of kidney disease if function is not improved and blood sugars remain elevated.

## 2019-08-02 NOTE — Progress Notes (Addendum)
Established Patient Office Visit  Subjective:  Patient ID: Seth Weeks, male    DOB: 01/06/1961  Age: 58 y.o. MRN: [id number]  CC:  Chief Complaint  Patient presents with  . Hospitalization Follow-up    [facility] 12/26-12/04/2019  . Acute Renal Failure    HPI Seth Weeks presents for hospital follow-up after recent admission for Acute Renal Failure and Acute Respiratory Failure due to pulmonary edema. He reports that he became very "puffy all over" and was having difficulty breathing and went to the hospital. He is feeling much better since his hospitalization and glad to be home. He has been taking all medications as prescribed with the exception of Janumet, which he has not used due to expense. He reports that his blood glucose levels are running high today (up to 330), but he gave himself and additional 10u of insulin glargine as a second dose and it is slowly decreasing. He has been compliant with the fluid restricted diet placed in the hospital, but reports he feels extremely thirsty. He has noticed some edema in his lower extremities this afternoon. He reports the hospital provided him with medication refills for four medications, but he did not bring those with him today and is unable to recall what they are. He does not feel he needs any refills on medications at this time.   Past Medical History:  Diagnosis Date  . CHF (congestive heart failure) (HCC) 04/11/2018  . Diabetes mellitus (HCC)   . GAD (generalized anxiety disorder) 07/17/2015  . High triglycerides   . Hypertension   . MDD (major depressive disorder) 03/17/2018    Past Surgical History:  Procedure Laterality Date  . TONSILLECTOMY      Family History  Problem Relation Age of Onset  . Heart failure Mother     Social History   Socioeconomic History  . Marital status: Single    Spouse name: Not on file  . Number of children: Not on file  . Years of education: Not on file  . Highest education level: Not on file   Occupational History  . Not on file  Tobacco Use  . Smoking status: Never Smoker  . Smokeless tobacco: Never Used  Substance and Sexual Activity  . Alcohol use: Yes  . Drug use: No  . Sexual activity: Not on file  Other Topics Concern  . Not on file  Social History Narrative  . Not on file   Social Determinants of Health   Financial Resource Strain:   . Difficulty of Paying Living Expenses: Not on file  Food Insecurity:   . Worried About Running Out of Food in the Last Year: Not on file  . Ran Out of Food in the Last Year: Not on file  Transportation Needs:   . Lack of Transportation (Medical): Not on file  . Lack of Transportation (Non-Medical): Not on file  Physical Activity:   . Days of Exercise per Week: Not on file  . Minutes of Exercise per Session: Not on file  Stress:   . Feeling of Stress : Not on file  Social Connections:   . Frequency of Communication with Friends and Family: Not on file  . Frequency of Social Gatherings with Friends and Family: Not on file  . Attends Religious Services: Not on file  . Active Member of Clubs or Organizations: Not on file  . Attends Club or Organization Meetings: Not on file  . Marital Status: Not on file  Intimate Partner Violence:   .   Fear of Current or Ex-Partner: Not on file  . Emotionally Abused: Not on file  . Physically Abused: Not on file  . Sexually Abused: Not on file    Outpatient Medications Prior to Visit  Medication Sig Dispense Refill  . alprazolam (XANAX) 2 MG tablet TAKE 1 TABLET BY MOUTH 3 TIMES A DAY AS NEEDED FOR ANXIETY - LAST FILLED 01/14/19 90 tablet 5  . AMBULATORY NON FORMULARY MEDICATION Single glucometer of choice with lancets, test strips. Test 3x daily.  E11.65 Disp QS 1 months 1 each 0  . aspirin EC 81 MG tablet Take by mouth.    . atorvastatin (LIPITOR) 40 MG tablet Take 1 tablet (40 mg total) by mouth daily. 90 tablet 1  . buPROPion (WELLBUTRIN XL) 150 MG 24 hr tablet TAKE 1 TABLET BY MOUTH  ONCE DAILY IN THE MORNING 90 tablet 3  . chlorhexidine (PERIDEX) 0.12 % solution     . Continuous Blood Gluc Sensor (FREESTYLE LIBRE 14 DAY SENSOR) MISC USE AS DIRECTED CHANGE  EVERY  14  DAYS 2 each 12  . furosemide (LASIX) 40 MG tablet TAKE 1 TABLET BY MOUTH  DAILY 90 tablet 3  . Insulin Glargine, 2 Unit Dial, (TOUJEO MAX SOLOSTAR) 300 UNIT/ML SOPN Inject 60 Units as directed daily. 9 mL 3  . Insulin Pen Needle 34G X 3.5 MM MISC 1 each by Does not apply route daily. 100 each 12  . levocetirizine (XYZAL) 5 MG tablet Take 1 tablet (5 mg total) by mouth every evening. 90 tablet 1  . loperamide (IMODIUM A-D) 2 MG tablet Take 1 tablet (2 mg total) by mouth 4 (four) times daily as needed for diarrhea or loose stools. 90 tablet 1  . losartan (COZAAR) 25 MG tablet Take by mouth.    . metoprolol succinate (TOPROL-XL) 50 MG 24 hr tablet Take 50 mg by mouth daily.    . Multiple Vitamin (MULTI-VITAMIN) tablet Take by mouth.    . NARCAN 4 MG/0.1ML LIQD nasal spray kit     . nitroGLYCERIN (NITROSTAT) 0.4 MG SL tablet PLACE 1 TABLET (0.4 MG TOTAL) UNDER THE TONGUE EVERY 5 (FIVE) MINUTES AS NEEDED FOR CHEST PAIN. 300 tablet 1  . omeprazole (PRILOSEC) 40 MG capsule Take 1 capsule (40 mg total) by mouth daily. 90 capsule 3  . ondansetron (ZOFRAN) 8 MG tablet Take 0.5-1 tablets (4-8 mg total) by mouth every 8 (eight) hours as needed for nausea or vomiting. 30 tablet 0  . promethazine (PHENERGAN) 25 MG tablet Take 1 tablet (25 mg total) by mouth every 8 (eight) hours as needed for nausea or vomiting. 30 tablet 2  . JANUMET 50-1000 MG tablet TAKE 1 TABLET BY MOUTH  TWICE DAILY WITH A MEAL 180 tablet 3  . torsemide (DEMADEX) 10 MG tablet Take 1 tablet (10 mg total) by mouth 2 (two) times daily. 14 tablet 1   No facility-administered medications prior to visit.    Allergies  Allergen Reactions  . Bee Venom Anaphylaxis  . Clindamycin/Lincomycin Diarrhea    Bad diarrhea. Please do not give.   . Pregabalin Other  (See Comments)    expensive    . Penicillins   . Terramycin [Oxytetracycline]     ROS Review of Systems  Constitutional: Negative for fatigue, fever and unexpected weight change.  HENT: Positive for nosebleeds.        Due to dry air  Eyes: Negative for visual disturbance.  Respiratory: Negative for cough, chest tightness, shortness of breath and   wheezing.   Cardiovascular: Positive for leg swelling. Negative for chest pain and palpitations.  Genitourinary: Negative for decreased urine volume, difficulty urinating, dysuria, flank pain and frequency.  Neurological: Negative for dizziness, weakness, light-headedness, numbness and headaches.      Objective:    Physical Exam  Constitutional: He is oriented to person, place, and time. He appears well-developed.  Eyes: Pupils are equal, round, and reactive to light. Conjunctivae are normal.  Neck: No JVD present.  Cardiovascular: Normal rate, regular rhythm, normal heart sounds, intact distal pulses and normal pulses.  No murmur heard. +2 pitting edema in bilateral lower extremities  Pulmonary/Chest: Effort normal and breath sounds normal. No respiratory distress. He has no wheezes.  Abdominal: Soft. Bowel sounds are normal.  Musculoskeletal:        General: Edema present. Normal range of motion.     Cervical back: Normal range of motion.  Lymphadenopathy:    He has no cervical adenopathy.  Neurological: He is alert and oriented to person, place, and time.  Skin: Skin is warm and dry.  Psychiatric: He has a normal mood and affect. His behavior is normal. Thought content normal.    BP 111/74   Pulse 97   Temp 98.2 F (36.8 C) (Oral)   Ht 5' 8" (1.727 m)   Wt 188 lb 0.6 oz (85.3 kg)   BMI 28.59 kg/m  Wt Readings from Last 3 Encounters:  08/02/19 188 lb 0.6 oz (85.3 kg)  07/23/19 200 lb (90.7 kg)  04/27/19 199 lb (90.3 kg)     Health Maintenance Due  Topic Date Due  . Hepatitis C Screening  11/28/1960  . PNEUMOCOCCAL  POLYSACCHARIDE VACCINE AGE 2-64 HIGH RISK  11/03/1962  . OPHTHALMOLOGY EXAM  11/03/1970  . HIV Screening  11/03/1975  . COLONOSCOPY  11/03/2010    There are no preventive care reminders to display for this patient.  Lab Results  Component Value Date   TSH 1.01 03/17/2018   Lab Results  Component Value Date   WBC 9.6 07/23/2019   HGB 16.1 07/23/2019   HCT 49.5 07/23/2019   MCV 80.6 07/23/2019   PLT 212 07/23/2019   Lab Results  Component Value Date   NA 134 (L) 07/23/2019   K 5.5 (H) 07/23/2019   CO2 23 07/23/2019   GLUCOSE 187 (H) 07/23/2019   BUN 36 (H) 07/23/2019   CREATININE 2.39 (H) 07/23/2019   BILITOT 1.4 (H) 07/23/2019   ALKPHOS 82 01/04/2015   AST 76 (H) 07/23/2019   ALT 65 (H) 07/23/2019   PROT 5.8 (L) 07/23/2019   ALBUMIN 3.8 01/04/2015   CALCIUM 8.5 (L) 07/23/2019   Lab Results  Component Value Date   CHOL 189 05/22/2019   Lab Results  Component Value Date   HDL 37 (L) 05/22/2019   Lab Results  Component Value Date   LDLCALC 105 (H) 05/22/2019   Lab Results  Component Value Date   TRIG 355 (H) 05/22/2019   Lab Results  Component Value Date   CHOLHDL 5.1 (H) 05/22/2019   Lab Results  Component Value Date   HGBA1C >14.0 03/16/2019      Assessment & Plan:   Problem List Items Addressed This Visit      Cardiovascular and Mediastinum   Benign hypertension with coincident congestive heart failure (HCC)    Blood pressure well controlled today. Continue with current regimen of medication and diet changes. Appt scheduled with Dr. Alexander for 08/29/19- will plan for labs at that time.         Chronic systolic congestive heart failure (HCC)    +2 Bilateral pitting edema in lower extremities. Pt instructed to elevate legs when he is sitting and to continue to follow medication and diet recommendations by the hospital. Appt with Dr. Alexander scheduled for 08/29/19- will plan for follow-up labs at that visit. No longer taking torsemide given by  urgent care.         Respiratory   Acute respiratory failure with hypoxia (HCC)    Lungs clear bilaterally with no respiratory distress present. Continue current medication and diet regimen recommended by the hospital. Appt with Dr. Alexander scheduled 08/29/19        Endocrine   Type 2 diabetes mellitus with peripheral neuropathy (HCC)    Blood glucose levels elevated today. Pt has not taken Janumet due to cost- instructed not to take with current kidney function. Will look into lower cost and safer options or assistance for patient and follow-up with him. Continue to use Tujeo and diet choices to help control blood sugars. Appt with Dr Alexander scheduled 08/29/19- will get labs at that time. May need to add pre-meal insulin if blood sugars are still elevated.         Genitourinary   Acute kidney failure (HCC)    Lab results upon discharge yesterday showed slightly improving kidney function. Plan to reorder labs at 08/29/19 appt with Dr. Alexander to assess progression. Will need to address DM2 management options in the setting of kidney disease if function is not improved and blood sugars remain elevated.         Other Visit Diagnoses    Hospital discharge follow-up    -  Primary      No orders of the defined types were placed in this encounter.   Follow-up: Return Has appt with Dr. Alexander 08/29/19.    Sara E. Early, NP 

## 2019-08-02 NOTE — Assessment & Plan Note (Addendum)
Blood glucose levels elevated today. Pt has not taken Janumet due to cost- instructed not to take with current kidney function. Will look into lower cost and safer options or assistance for patient and follow-up with him. Continue to use Tujeo and diet choices to help control blood sugars. Appt with Dr Sheppard Coil scheduled 08/29/19- will get labs at that time. May need to add pre-meal insulin if blood sugars are still elevated.

## 2019-08-02 NOTE — Assessment & Plan Note (Addendum)
Blood pressure well controlled today. Continue with current regimen of medication and diet changes. Appt scheduled with Dr. Sheppard Coil for 08/29/19- will plan for labs at that time.

## 2019-08-02 NOTE — Assessment & Plan Note (Addendum)
+  2 Bilateral pitting edema in lower extremities. Pt instructed to elevate legs when he is sitting and to continue to follow medication and diet recommendations by the hospital. Appt with Dr. Sheppard Coil scheduled for 08/29/19- will plan for follow-up labs at that visit. No longer taking torsemide given by urgent care.

## 2019-08-03 ENCOUNTER — Other Ambulatory Visit: Payer: Self-pay | Admitting: Nurse Practitioner

## 2019-08-03 NOTE — Addendum Note (Signed)
Addended by: Chalon Zobrist, Clarise Cruz E on: 08/03/2019 01:04 PM   Modules accepted: Orders

## 2019-08-17 ENCOUNTER — Ambulatory Visit: Payer: BC Managed Care – PPO | Admitting: Podiatry

## 2019-08-29 ENCOUNTER — Other Ambulatory Visit: Payer: Self-pay

## 2019-08-29 ENCOUNTER — Encounter: Payer: Self-pay | Admitting: Osteopathic Medicine

## 2019-08-29 ENCOUNTER — Ambulatory Visit (INDEPENDENT_AMBULATORY_CARE_PROVIDER_SITE_OTHER): Payer: Self-pay | Admitting: Osteopathic Medicine

## 2019-08-29 VITALS — BP 133/76 | HR 79 | Temp 98.1°F | Wt 182.0 lb

## 2019-08-29 DIAGNOSIS — I10 Essential (primary) hypertension: Secondary | ICD-10-CM

## 2019-08-29 DIAGNOSIS — I5022 Chronic systolic (congestive) heart failure: Secondary | ICD-10-CM

## 2019-08-29 DIAGNOSIS — I152 Hypertension secondary to endocrine disorders: Secondary | ICD-10-CM

## 2019-08-29 DIAGNOSIS — F419 Anxiety disorder, unspecified: Secondary | ICD-10-CM

## 2019-08-29 DIAGNOSIS — F1320 Sedative, hypnotic or anxiolytic dependence, uncomplicated: Secondary | ICD-10-CM

## 2019-08-29 DIAGNOSIS — Z9119 Patient's noncompliance with other medical treatment and regimen: Secondary | ICD-10-CM

## 2019-08-29 DIAGNOSIS — E1159 Type 2 diabetes mellitus with other circulatory complications: Secondary | ICD-10-CM

## 2019-08-29 DIAGNOSIS — Z91199 Patient's noncompliance with other medical treatment and regimen due to unspecified reason: Secondary | ICD-10-CM

## 2019-08-29 DIAGNOSIS — E782 Mixed hyperlipidemia: Secondary | ICD-10-CM

## 2019-08-29 DIAGNOSIS — E1142 Type 2 diabetes mellitus with diabetic polyneuropathy: Secondary | ICD-10-CM

## 2019-08-29 LAB — POCT GLYCOSYLATED HEMOGLOBIN (HGB A1C): HbA1c POC (<> result, manual entry): >14 % (ref 4.0–5.6)

## 2019-08-29 MED ORDER — TOUJEO MAX SOLOSTAR 300 UNIT/ML ~~LOC~~ SOPN: 60.0000 [IU] | PEN_INJECTOR | Freq: Every day | SUBCUTANEOUS | 11 refills | Status: DC

## 2019-08-29 NOTE — Progress Notes (Signed)
Seth Weeks is a 59 y.o. male who presents to  [facility]  today, 08/29/19, seeking care for the following:  The primary encounter diagnosis was Type 2 diabetes mellitus with peripheral neuropathy ([place]). Diagnoses of History of nonadherence to medical treatment, Mixed hyperlipidemia, Hypertension associated with diabetes ([place]), Chronic systolic congestive heart failure ([place]), Anxiety, and Benzodiazepine dependence ([place]) were also pertinent to this visit.   ASSESSMENT & PLAN with other pertinent history/findings:   1. Type 2 diabetes mellitus with peripheral neuropathy (HCC) 2. History of nonadherence to medical treatment Financial issues are a concern for him.  I filled out forms today for patient assistance to get the Toujeo hopefully free for him.  Patient does not currently have insurance.  A1c is greater than 14.  Patient is aware of risks of untreated diabetes into the and complications that may eventually result in death.  He is encouraged to send out the patient assistance program paperwork with the printed to share prescription ASAP, we will work on getting samples but these cannot be relied on [place] chronic pain due to diabetic neuropathy and cervical spondylosis, for which he is following with pain management.  Given financial situation, if after record review I see no red flags, I would be open to continuing his therapy here -records reviewed, pill counts are as expected, last UDS 03/02/2019 was negative for Percocet and alprazolam.  3. Mixed hyperlipidemia Continue statin  4. Hypertension associated with diabetes ([place]) BP Readings from Last 3 Encounters:  08/29/19 133/76  08/02/19 111/74  07/23/19 95/73   5. Chronic systolic congestive heart failure (HCC) Continue medications as below.  6. Anxiety  Long-term benzodiazepine use, patient is likely dependent at this point, taking 2 mg of alprazolam consistently 3 times  daily.  He reports that his pain management providers have advised that he cut back on this, unclear if this is a condition of continuation of opiates.  I would agree that there are certainly some potential unsafe complications and we should work to cut back on the alprazolam over the next month/years   Orders Placed This Encounter  Procedures  . POCT HgB A1C    Meds ordered this encounter  Medications  . Insulin Glargine, 2 Unit Dial, (TOUJEO MAX SOLOSTAR) 300 UNIT/ML SOPN    Sig: Inject 60-100 Units as directed daily.    Dispense:  9 mL    Refill:  11    Patient Instructions  Plan:  Will work on patient assistance for insulin / may be able to get samples   Will request and review pain management records  Will leave Alprazolam as-is for now, but try to come off this or reduce dose over the next year or two.       Follow-up instructions: Return in about 1 month (around 09/29/2019) for virtual - follow up on pain management records, alprazolam, insulin .        BP 133/76 (BP Location: Left Arm, Patient Position: Sitting, Cuff Size: Normal)   Pulse 79   Temp 98.1 F (36.7 C) (Oral)   Wt 182 lb (82.6 kg)   SpO2 98%   BMI 27.67 kg/m   Current Meds  Medication Sig  . alprazolam (XANAX) 2 MG tablet TAKE 1 TABLET BY MOUTH 3 TIMES A DAY AS NEEDED FOR ANXIETY - LAST FILLED 01/14/19  . AMBULATORY NON FORMULARY MEDICATION Single glucometer of choice with lancets, test strips. Test 3x daily.  E11.65 Disp QS 1  months  . aspirin EC 81 MG tablet Take by mouth.  [name] atorvastatin (LIPITOR) 40 MG tablet Take 1 tablet (40 mg total) by mouth daily.  [name] buPROPion (WELLBUTRIN XL) 150 MG 24 hr tablet TAKE 1 TABLET BY MOUTH ONCE DAILY IN THE MORNING  . chlorhexidine (PERIDEX) 0.12 % solution   . Continuous Blood Gluc Sensor (FREESTYLE LIBRE 14 DAY SENSOR) MISC USE AS DIRECTED CHANGE  EVERY  14  DAYS  . furosemide (LASIX) 40 MG tablet TAKE 1 TABLET BY MOUTH  DAILY  . Insulin Pen Needle  34G X 3.5 MM MISC 1 each by Does not apply route daily.  [name] levocetirizine (XYZAL) 5 MG tablet Take 1 tablet (5 mg total) by mouth every evening.  . loperamide (IMODIUM A-D) 2 MG tablet Take 1 tablet (2 mg total) by mouth 4 (four) times daily as needed for diarrhea or loose stools.  [name] losartan (COZAAR) 25 MG tablet Take by mouth.  . metoprolol succinate (TOPROL-XL) 50 MG 24 hr tablet Take 50 mg by mouth daily.  . Multiple Vitamin (MULTI-VITAMIN) tablet Take by mouth.  Karma Greaser 4 MG/0.1ML LIQD nasal spray kit   . nitroGLYCERIN (NITROSTAT) 0.4 MG SL tablet PLACE 1 TABLET (0.4 MG TOTAL) UNDER THE TONGUE EVERY 5 (FIVE) MINUTES AS NEEDED FOR CHEST PAIN.  [name] omeprazole (PRILOSEC) 40 MG capsule Take 1 capsule (40 mg total) by mouth daily.  . ondansetron (ZOFRAN) 8 MG tablet Take 0.5-1 tablets (4-8 mg total) by mouth every 8 (eight) hours as needed for nausea or vomiting.  . promethazine (PHENERGAN) 25 MG tablet Take 1 tablet (25 mg total) by mouth every 8 (eight) hours as needed for nausea or vomiting.    Results for orders placed or performed in visit on 08/29/19 (from the past 72 hour(s))  POCT HgB A1C     Status: None   Collection Time: 08/29/19  1:26 PM  Result Value Ref Range   Hemoglobin A1C     HbA1c POC (<> result, manual entry) >14.0 4.0 - 5.6 %   HbA1c, POC (prediabetic range)     HbA1c, POC (controlled diabetic range)      No results found.  Depression screen [facility] 2/9 04/27/2019 11/24/2018 08/18/2018  Decreased Interest _0 Down, Depressed, Hopeless _1 PHQ - 2 Score _2 Altered sleeping _3 Tired, decreased energy _4 Change in appetite _5 Feeling bad or failure about yourself  _6 Trouble concentrating _7 Moving slowly or fidgety/restless 3 2 0  Suicidal thoughts _8 PHQ-9 Score _9 Difficult doing work/chores Extremely dIfficult Very difficult Very difficult  Some recent data might be hidden    GAD 7 : Generalized Anxiety Score 04/27/2019  11/24/2018 08/18/2018 03/17/2018  Nervous, Anxious, on Edge _10 Control/stop worrying _11 Worry too much - different things _12 Trouble relaxing _13 Restless _14 Easily annoyed or irritable _15 Afraid - awful might happen 3 0 3 3  Total GAD 7 Score _16 Anxiety Difficulty Extremely difficult Very difficult Very difficult Extremely difficult      All questions at time of visit were answered - patient instructed to contact office with any additional concerns or updates.  ER/RTC precautions were reviewed  with the patient.  Please note: voice recognition software was used to produce this document, and typos may escape review. Please contact Dr. Sheppard Coil for any needed clarifications.

## 2019-08-29 NOTE — Patient Instructions (Signed)
Plan:  Will work on patient assistance for insulin / may be able to get samples   Will request and review pain management records  Will leave Alprazolam as-is for now, but try to come off this or reduce dose over the next year or two.

## 2019-09-06 ENCOUNTER — Telehealth: Payer: Self-pay

## 2019-09-06 ENCOUNTER — Other Ambulatory Visit: Payer: Self-pay

## 2019-09-06 MED ORDER — CHLORHEXIDINE GLUCONATE 0.12 % MT SOLN: 10.0000 mL | Freq: Four times a day (QID) | OROMUCOSAL | 1 refills | Status: DC

## 2019-09-06 MED ORDER — BUPROPION HCL ER (XL) 150 MG PO TB24: ORAL_TABLET | ORAL | 1 refills | Status: DC

## 2019-09-06 NOTE — Telephone Encounter (Signed)
Pt requesting a med refill for Peridex mouth wash. Rx written by historical provider. As per pt, requested refill at last visit with provider but it was never sent. Pls advise, thanks.

## 2019-09-10 ENCOUNTER — Other Ambulatory Visit: Payer: Self-pay | Admitting: Family Medicine

## 2019-09-15 ENCOUNTER — Other Ambulatory Visit: Payer: Self-pay | Admitting: Family Medicine

## 2019-09-15 DIAGNOSIS — F419 Anxiety disorder, unspecified: Secondary | ICD-10-CM

## 2019-09-15 DIAGNOSIS — F329 Major depressive disorder, single episode, unspecified: Secondary | ICD-10-CM

## 2019-09-15 DIAGNOSIS — F32A Depression, unspecified: Secondary | ICD-10-CM

## 2019-09-17 ENCOUNTER — Ambulatory Visit: Payer: BC Managed Care – PPO | Admitting: Podiatry

## 2019-09-26 ENCOUNTER — Encounter: Payer: Self-pay | Admitting: Osteopathic Medicine

## 2019-09-26 ENCOUNTER — Telehealth (INDEPENDENT_AMBULATORY_CARE_PROVIDER_SITE_OTHER): Payer: Self-pay | Admitting: Osteopathic Medicine

## 2019-09-26 DIAGNOSIS — E1159 Type 2 diabetes mellitus with other circulatory complications: Secondary | ICD-10-CM

## 2019-09-26 DIAGNOSIS — Z794 Long term (current) use of insulin: Secondary | ICD-10-CM

## 2019-09-26 DIAGNOSIS — F32A Depression, unspecified: Secondary | ICD-10-CM

## 2019-09-26 DIAGNOSIS — F419 Anxiety disorder, unspecified: Secondary | ICD-10-CM

## 2019-09-26 DIAGNOSIS — F329 Major depressive disorder, single episode, unspecified: Secondary | ICD-10-CM

## 2019-09-26 NOTE — Progress Notes (Signed)
Virtual Visit via Video (App used: Docimity) Note  I connected with      Seth Weeks on 09/26/19 at 12:55 PM  by a telemedicine application and verified that I am speaking with the correct person using two identifiers.  Patient is in his car  I am in office   I discussed the limitations of evaluation and management by telemedicine and the availability of in person appointments. The patient expressed understanding and agreed to proceed.  History of Present Illness: Seth Weeks is a 59 y.o. male who would like to discuss  Chief Complaint  Patient presents with  . 1 month follow up    Gad 7: 19  . Medication Management    states he is needed xanax every day  . Medication Refill    xyzal,zofran,phenergan     DM2: Sugars "not too good." Hasn't completed the paperwork for Rx assistance   Anxiety: Stable, still on Xanax 2 mg tid started by previous provider  Pain management has hesitation to continue opiates w/ this beno dose. Pt accepts risk of death. I think as long as he is taking medications as prescribed and no concern for diversion, I don't have an issue continuing the benzos and planning to taper down over the next few years, be off daily benzo meds by age 72-65. Lack of insurance precludes mental health care referral at this time         Observations/Objective: There were no vitals taken for this visit. BP Readings from Last 3 Encounters:  08/29/19 133/76  08/02/19 111/74  07/23/19 95/73   Exam: Normal Speech.  NAD  Lab and Radiology Results No results found for this or any previous visit (from the past 72 hour(s)). No results found.     Assessment and Plan: 59 y.o. male with The primary encounter diagnosis was Type 2 diabetes mellitus with other circulatory complication, with long-term current use of insulin ([place]). A diagnosis of Anxiety and depression was also pertinent to this visit.   Refills ok for now   Follow w/ pain management  Pt seems  uninterested in getting DM2 under control - needs to fill out papers for Rx assistance which were provided to paitent a month ago, or there's little I can do to help him. Risks of poor DM2 control including death from DKA/eventual DM2 complications including infection, CAD, etc were reviewed again   PDMP reviewed during this encounter. No orders of the defined types were placed in this encounter.  Meds ordered this encounter  Medications  . alprazolam (XANAX) 2 MG tablet    Sig: Take 1 tablet (2 mg total) by mouth 3 (three) times daily as needed. for anxiety. #90 for 30 days    Dispense:  90 tablet    Refill:  2   There are no Patient Instructions on file for this visit.     Follow Up Instructions: No follow-ups on file.    I discussed the assessment and treatment plan with the patient. The patient was provided an opportunity to ask questions and all were answered. The patient agreed with the plan and demonstrated an understanding of the instructions.   The patient was advised to call back or seek an in-person evaluation if any new concerns, if symptoms worsen or if the condition fails to improve as anticipated.  30 minutes of non-face-to-face time was provided during this encounter.      . . . . . . . . . . . . . [name]  Historical information moved to improve visibility of documentation.  Past Medical History:  Diagnosis Date  . CHF (congestive heart failure) ([place]) 04/11/2018  . Diabetes mellitus ([place])   . GAD (generalized anxiety disorder) 07/17/2015  . High triglycerides   . Hypertension   . MDD (major depressive disorder) 03/17/2018   Past Surgical History:  Procedure Laterality Date  . TONSILLECTOMY     Social History   Tobacco Use  . Smoking status: Never Smoker  . Smokeless tobacco: Never Used  Substance Use Topics  . Alcohol use: Yes   family history includes Heart failure in his mother.  Medications: Current  Outpatient Medications  Medication Sig Dispense Refill  . alprazolam (XANAX) 2 MG tablet TAKE ONE TABLET BY MOUTH THREE TIMES A DAY AS NEEDED FOR ANXIETY 90 tablet 0  . AMBULATORY NON FORMULARY MEDICATION Single glucometer of choice with lancets, test strips. Test 3x daily.  E11.65 Disp QS 1 months 1 each 0  . aspirin EC 81 MG tablet Take by mouth.    [name] atorvastatin (LIPITOR) 40 MG tablet Take 1 tablet (40 mg total) by mouth daily. 90 tablet 1  . buPROPion (WELLBUTRIN XL) 150 MG 24 hr tablet Take 1 tablet by mouth once daily in the morning 90 tablet 1  . chlorhexidine (PERIDEX) 0.12 % solution Use as directed 10 mLs in the mouth or throat 4 (four) times daily. 473 mL 1  . Continuous Blood Gluc Sensor (FREESTYLE LIBRE 14 DAY SENSOR) MISC USE AS DIRECTED CHANGE  EVERY  14  DAYS 2 each 12  . furosemide (LASIX) 40 MG tablet TAKE 1 TABLET BY MOUTH  DAILY 90 tablet 3  . Insulin Glargine, 2 Unit Dial, (TOUJEO MAX SOLOSTAR) 300 UNIT/ML SOPN Inject 60-100 Units as directed daily. 9 mL 11  . Insulin Pen Needle 34G X 3.5 MM MISC 1 each by Does not apply route daily. 100 each 12  . levocetirizine (XYZAL) 5 MG tablet Take 1 tablet (5 mg total) by mouth every evening. 90 tablet 1  . loperamide (IMODIUM A-D) 2 MG tablet Take 1 tablet (2 mg total) by mouth 4 (four) times daily as needed for diarrhea or loose stools. 90 tablet 1  . losartan (COZAAR) 25 MG tablet Take by mouth.    . metoprolol succinate (TOPROL-XL) 50 MG 24 hr tablet Take 50 mg by mouth daily.    . Multiple Vitamin (MULTI-VITAMIN) tablet Take by mouth.    Karma Greaser 4 MG/0.1ML LIQD nasal spray kit     . nitroGLYCERIN (NITROSTAT) 0.4 MG SL tablet PLACE 1 TABLET (0.4 MG TOTAL) UNDER THE TONGUE EVERY 5 (FIVE) MINUTES AS NEEDED FOR CHEST PAIN. 300 tablet 1  . omeprazole (PRILOSEC) 40 MG capsule Take 1 capsule (40 mg total) by mouth daily. 90 capsule 3  . ondansetron (ZOFRAN) 8 MG tablet Take 0.5-1 tablets (4-8 mg total) by mouth every 8 (eight) hours  as needed for nausea or vomiting. 30 tablet 0  . promethazine (PHENERGAN) 25 MG tablet Take 1 tablet (25 mg total) by mouth every 8 (eight) hours as needed for nausea or vomiting. 30 tablet 2   No current facility-administered medications for this visit.   Allergies  Allergen Reactions  . Bee Venom Anaphylaxis  . Clindamycin/Lincomycin Diarrhea    Bad diarrhea. Please do not give.   . Pregabalin Other (See Comments)    expensive    . Penicillins   . Terramycin [Oxytetracycline]

## 2019-09-29 MED ORDER — ALPRAZOLAM 2 MG PO TABS: 2.0000 mg | ORAL_TABLET | Freq: Three times a day (TID) | ORAL | 2 refills | Status: AC | PRN

## 2019-10-04 ENCOUNTER — Other Ambulatory Visit: Payer: Self-pay

## 2019-10-04 MED ORDER — FUROSEMIDE 40 MG PO TABS: 40.0000 mg | ORAL_TABLET | Freq: Every day | ORAL | 0 refills | Status: DC

## 2019-10-17 ENCOUNTER — Emergency Department (HOSPITAL_COMMUNITY): Payer: Self-pay

## 2019-10-17 ENCOUNTER — Other Ambulatory Visit: Payer: Self-pay | Admitting: Family Medicine

## 2019-10-17 ENCOUNTER — Inpatient Hospital Stay (HOSPITAL_COMMUNITY)
Admission: EM | Admit: 2019-10-17 | Discharge: 2019-10-19 | DRG: 100 | Disposition: A | Discharge status: Home / Self Care | Payer: Self-pay | Attending: Internal Medicine | Admitting: Internal Medicine

## 2019-10-17 DIAGNOSIS — Z20822 Contact with and (suspected) exposure to covid-19: Secondary | ICD-10-CM | POA: Diagnosis present

## 2019-10-17 DIAGNOSIS — N179 Acute kidney failure, unspecified: Secondary | ICD-10-CM | POA: Diagnosis present

## 2019-10-17 DIAGNOSIS — R4781 Slurred speech: Secondary | ICD-10-CM

## 2019-10-17 DIAGNOSIS — Z79899 Other long term (current) drug therapy: Secondary | ICD-10-CM

## 2019-10-17 DIAGNOSIS — E860 Dehydration: Secondary | ICD-10-CM | POA: Diagnosis present

## 2019-10-17 DIAGNOSIS — E1310 Other specified diabetes mellitus with ketoacidosis without coma: Secondary | ICD-10-CM

## 2019-10-17 DIAGNOSIS — Z7982 Long term (current) use of aspirin: Secondary | ICD-10-CM

## 2019-10-17 DIAGNOSIS — E781 Pure hyperglyceridemia: Secondary | ICD-10-CM | POA: Diagnosis present

## 2019-10-17 DIAGNOSIS — E1010 Type 1 diabetes mellitus with ketoacidosis without coma: Secondary | ICD-10-CM | POA: Diagnosis present

## 2019-10-17 DIAGNOSIS — Z880 Allergy status to penicillin: Secondary | ICD-10-CM

## 2019-10-17 DIAGNOSIS — R739 Hyperglycemia, unspecified: Secondary | ICD-10-CM

## 2019-10-17 DIAGNOSIS — F329 Major depressive disorder, single episode, unspecified: Secondary | ICD-10-CM | POA: Diagnosis present

## 2019-10-17 DIAGNOSIS — E1040 Type 1 diabetes mellitus with diabetic neuropathy, unspecified: Secondary | ICD-10-CM | POA: Diagnosis present

## 2019-10-17 DIAGNOSIS — IMO0002 Reserved for concepts with insufficient information to code with codable children: Secondary | ICD-10-CM

## 2019-10-17 DIAGNOSIS — I639 Cerebral infarction, unspecified: Secondary | ICD-10-CM | POA: Diagnosis present

## 2019-10-17 DIAGNOSIS — F419 Anxiety disorder, unspecified: Secondary | ICD-10-CM

## 2019-10-17 DIAGNOSIS — Z91030 Bee allergy status: Secondary | ICD-10-CM

## 2019-10-17 DIAGNOSIS — E041 Nontoxic single thyroid nodule: Secondary | ICD-10-CM | POA: Diagnosis present

## 2019-10-17 DIAGNOSIS — Z888 Allergy status to other drugs, medicaments and biological substances status: Secondary | ICD-10-CM

## 2019-10-17 DIAGNOSIS — R4701 Aphasia: Secondary | ICD-10-CM | POA: Diagnosis present

## 2019-10-17 DIAGNOSIS — E1065 Type 1 diabetes mellitus with hyperglycemia: Secondary | ICD-10-CM

## 2019-10-17 DIAGNOSIS — I152 Hypertension secondary to endocrine disorders: Secondary | ICD-10-CM | POA: Diagnosis present

## 2019-10-17 DIAGNOSIS — E1159 Type 2 diabetes mellitus with other circulatory complications: Secondary | ICD-10-CM | POA: Diagnosis present

## 2019-10-17 DIAGNOSIS — R569 Unspecified convulsions: Principal | ICD-10-CM

## 2019-10-17 DIAGNOSIS — Z8249 Family history of ischemic heart disease and other diseases of the circulatory system: Secondary | ICD-10-CM

## 2019-10-17 DIAGNOSIS — E785 Hyperlipidemia, unspecified: Secondary | ICD-10-CM | POA: Diagnosis present

## 2019-10-17 DIAGNOSIS — F411 Generalized anxiety disorder: Secondary | ICD-10-CM | POA: Diagnosis present

## 2019-10-17 DIAGNOSIS — F32A Depression, unspecified: Secondary | ICD-10-CM

## 2019-10-17 DIAGNOSIS — I2510 Atherosclerotic heart disease of native coronary artery without angina pectoris: Secondary | ICD-10-CM | POA: Diagnosis present

## 2019-10-17 DIAGNOSIS — Z794 Long term (current) use of insulin: Secondary | ICD-10-CM

## 2019-10-17 LAB — CBC
HCT: 43.2 % (ref 39.0–52.0)
Hemoglobin: 14.3 g/dL (ref 13.0–17.0)
MCH: 27.7 pg (ref 26.0–34.0)
MCHC: 33.1 g/dL (ref 30.0–36.0)
MCV: 83.7 fL (ref 80.0–100.0)
Platelets: 202 10*3/uL (ref 150–400)
RBC: 5.16 MIL/uL (ref 4.22–5.81)
RDW: 14.6 % (ref 11.5–15.5)
WBC: 15.2 10*3/uL — ABNORMAL HIGH (ref 4.0–10.5)
nRBC: 0 % (ref 0.0–0.2)

## 2019-10-17 LAB — DIFFERENTIAL
Abs Immature Granulocytes: 0.05 10*3/uL (ref 0.00–0.07)
Basophils Absolute: 0 10*3/uL (ref 0.0–0.1)
Basophils Relative: 0 %
Eosinophils Absolute: 0 10*3/uL (ref 0.0–0.5)
Eosinophils Relative: 0 %
Immature Granulocytes: 0 %
Lymphocytes Relative: 3 %
Lymphs Abs: 0.5 10*3/uL — ABNORMAL LOW (ref 0.7–4.0)
Monocytes Absolute: 1 10*3/uL (ref 0.1–1.0)
Monocytes Relative: 7 %
Neutro Abs: 13.6 10*3/uL — ABNORMAL HIGH (ref 1.7–7.7)
Neutrophils Relative %: 90 %

## 2019-10-17 LAB — URINALYSIS, ROUTINE W REFLEX MICROSCOPIC
Bacteria, UA: NONE SEEN
Bilirubin Urine: NEGATIVE
Glucose, UA: >=500 mg/dL — AB
Hgb urine dipstick: NEGATIVE
Ketones, ur: NEGATIVE mg/dL
Leukocytes,Ua: NEGATIVE
Nitrite: NEGATIVE
Protein, ur: NEGATIVE mg/dL
Specific Gravity, Urine: 1.025 (ref 1.005–1.030)
pH: 5 (ref 5.0–8.0)

## 2019-10-17 LAB — COMPREHENSIVE METABOLIC PANEL
ALT: 35 U/L (ref 0–44)
AST: 41 U/L (ref 15–41)
Albumin: 3.6 g/dL (ref 3.5–5.0)
Alkaline Phosphatase: 109 U/L (ref 38–126)
Anion gap: 18 — ABNORMAL HIGH (ref 5–15)
BUN: 33 mg/dL — ABNORMAL HIGH (ref 6–20)
CO2: 17 mmol/L — ABNORMAL LOW (ref 22–32)
Calcium: 9 mg/dL (ref 8.9–10.3)
Chloride: 90 mmol/L — ABNORMAL LOW (ref 98–111)
Creatinine, Ser: 1.68 mg/dL — ABNORMAL HIGH (ref 0.61–1.24)
GFR calc Af Amer: 51 mL/min — ABNORMAL LOW (ref >60)
GFR calc non Af Amer: 44 mL/min — ABNORMAL LOW (ref >60)
Glucose, Bld: 818 mg/dL (ref 70–99)
Potassium: 4.8 mmol/L (ref 3.5–5.1)
Sodium: 125 mmol/L — ABNORMAL LOW (ref 135–145)
Total Bilirubin: 0.9 mg/dL (ref 0.3–1.2)
Total Protein: 7.2 g/dL (ref 6.5–8.1)

## 2019-10-17 LAB — PROTIME-INR
INR: 1 (ref 0.8–1.2)
Prothrombin Time: 13.3 seconds (ref 11.4–15.2)

## 2019-10-17 LAB — RAPID URINE DRUG SCREEN, HOSP PERFORMED
Amphetamines: NOT DETECTED
Barbiturates: NOT DETECTED
Benzodiazepines: NOT DETECTED
Cocaine: NOT DETECTED
Opiates: NOT DETECTED
Tetrahydrocannabinol: NOT DETECTED

## 2019-10-17 LAB — CBG MONITORING, ED
Glucose-Capillary: >600 mg/dL (ref 70–99)
Glucose-Capillary: 459 mg/dL — ABNORMAL HIGH (ref 70–99)
Glucose-Capillary: 383 mg/dL — ABNORMAL HIGH (ref 70–99)
Glucose-Capillary: 234 mg/dL — ABNORMAL HIGH (ref 70–99)
Glucose-Capillary: 166 mg/dL — ABNORMAL HIGH (ref 70–99)
Glucose-Capillary: 130 mg/dL — ABNORMAL HIGH (ref 70–99)

## 2019-10-17 LAB — I-STAT CHEM 8, ED
BUN: 33 mg/dL — ABNORMAL HIGH (ref 6–20)
Calcium, Ion: 1.12 mmol/L — ABNORMAL LOW (ref 1.15–1.40)
Chloride: 92 mmol/L — ABNORMAL LOW (ref 98–111)
Creatinine, Ser: 1.4 mg/dL — ABNORMAL HIGH (ref 0.61–1.24)
Glucose, Bld: >700 mg/dL (ref 70–99)
HCT: 44 % (ref 39.0–52.0)
Hemoglobin: 15 g/dL (ref 13.0–17.0)
Potassium: 4.8 mmol/L (ref 3.5–5.1)
Sodium: 127 mmol/L — ABNORMAL LOW (ref 135–145)
TCO2: 19 mmol/L — ABNORMAL LOW (ref 22–32)

## 2019-10-17 LAB — BASIC METABOLIC PANEL
Anion gap: 11 (ref 5–15)
BUN: 21 mg/dL — ABNORMAL HIGH (ref 6–20)
CO2: 24 mmol/L (ref 22–32)
Calcium: 8.5 mg/dL — ABNORMAL LOW (ref 8.9–10.3)
Chloride: 102 mmol/L (ref 98–111)
Creatinine, Ser: 1 mg/dL (ref 0.61–1.24)
GFR calc Af Amer: >60 mL/min (ref >60)
GFR calc non Af Amer: >60 mL/min (ref >60)
Glucose, Bld: 188 mg/dL — ABNORMAL HIGH (ref 70–99)
Potassium: 3.7 mmol/L (ref 3.5–5.1)
Sodium: 137 mmol/L (ref 135–145)

## 2019-10-17 LAB — GLUCOSE, CAPILLARY
Glucose-Capillary: 154 mg/dL — ABNORMAL HIGH (ref 70–99)
Glucose-Capillary: 156 mg/dL — ABNORMAL HIGH (ref 70–99)
Glucose-Capillary: 173 mg/dL — ABNORMAL HIGH (ref 70–99)
Glucose-Capillary: 188 mg/dL — ABNORMAL HIGH (ref 70–99)

## 2019-10-17 LAB — PHOSPHORUS: Phosphorus: 3.8 mg/dL (ref 2.5–4.6)

## 2019-10-17 LAB — APTT: aPTT: 24 s (ref 24–36)

## 2019-10-17 LAB — ETHANOL: Alcohol, Ethyl (B): <10 mg/dL

## 2019-10-17 LAB — BETA-HYDROXYBUTYRIC ACID: Beta-Hydroxybutyric Acid: 0.11 mmol/L (ref 0.05–0.27)

## 2019-10-17 LAB — RESPIRATORY PANEL BY RT PCR (FLU A&B, COVID)
Influenza A by PCR: NEGATIVE
Influenza B by PCR: NEGATIVE
SARS Coronavirus 2 by RT PCR: NEGATIVE

## 2019-10-17 LAB — MAGNESIUM: Magnesium: 2.1 mg/dL (ref 1.7–2.4)

## 2019-10-17 MED ORDER — SODIUM CHLORIDE 0.9 % IV SOLN: INTRAVENOUS | Status: DC

## 2019-10-17 MED ORDER — LEVETIRACETAM IN NACL 1000 MG/100ML IV SOLN
1000.0000 mg | Freq: Once | INTRAVENOUS | Status: AC
  Administered 2019-10-17: 1000 mg via INTRAVENOUS

## 2019-10-17 MED ORDER — ASPIRIN EC 81 MG PO TBEC
81.0000 mg | DELAYED_RELEASE_TABLET | Freq: Every day | ORAL | Status: DC
  Administered 2019-10-18 – 2019-10-19 (×2): 81 mg via ORAL
  Filled 2019-10-17 (×2): qty 1

## 2019-10-17 MED ORDER — DEXTROSE-NACL 5-0.45 % IV SOLN: INTRAVENOUS | Status: DC

## 2019-10-17 MED ORDER — ACETAMINOPHEN 325 MG PO TABS: 650.0000 mg | ORAL_TABLET | ORAL | Status: DC | PRN

## 2019-10-17 MED ORDER — LORAZEPAM 2 MG/ML IJ SOLN
INTRAMUSCULAR | Status: AC
  Filled 2019-10-17: qty 1

## 2019-10-17 MED ORDER — ACETAMINOPHEN 160 MG/5ML PO SOLN: 650.0000 mg | ORAL | Status: DC | PRN

## 2019-10-17 MED ORDER — LORAZEPAM 2 MG/ML IJ SOLN
1.0000 mg | Freq: Once | INTRAMUSCULAR | Status: AC
  Administered 2019-10-17: 1 mg via INTRAVENOUS

## 2019-10-17 MED ORDER — INSULIN REGULAR(HUMAN) IN NACL 100-0.9 UT/100ML-% IV SOLN
INTRAVENOUS | Status: DC
  Administered 2019-10-17: 6.5 [IU]/h via INTRAVENOUS
  Filled 2019-10-17: qty 100

## 2019-10-17 MED ORDER — SODIUM CHLORIDE 0.9% FLUSH
3.0000 mL | Freq: Once | INTRAVENOUS | Status: AC
  Administered 2019-10-17: 3 mL via INTRAVENOUS

## 2019-10-17 MED ORDER — PANTOPRAZOLE SODIUM 40 MG PO TBEC
40.0000 mg | DELAYED_RELEASE_TABLET | Freq: Every day | ORAL | Status: DC
  Administered 2019-10-18 – 2019-10-19 (×2): 40 mg via ORAL
  Filled 2019-10-17 (×2): qty 1

## 2019-10-17 MED ORDER — IOHEXOL 350 MG/ML SOLN
100.0000 mL | Freq: Once | INTRAVENOUS | Status: AC | PRN
  Administered 2019-10-17: 100 mL via INTRAVENOUS

## 2019-10-17 MED ORDER — LEVETIRACETAM IN NACL 1000 MG/100ML IV SOLN
1000.0000 mg | Freq: Once | INTRAVENOUS | Status: AC
  Administered 2019-10-17: 1000 mg via INTRAVENOUS
  Filled 2019-10-17: qty 100

## 2019-10-17 MED ORDER — ACETAMINOPHEN 650 MG RE SUPP: 650.0000 mg | RECTAL | Status: DC | PRN

## 2019-10-17 MED ORDER — ENOXAPARIN SODIUM 40 MG/0.4ML ~~LOC~~ SOLN
40.0000 mg | SUBCUTANEOUS | Status: DC
  Administered 2019-10-17 – 2019-10-18 (×2): 40 mg via SUBCUTANEOUS
  Filled 2019-10-17 (×2): qty 0.4

## 2019-10-17 MED ORDER — SODIUM CHLORIDE 0.9 % IV BOLUS
1000.0000 mL | Freq: Once | INTRAVENOUS | Status: AC
  Administered 2019-10-17: 1000 mL via INTRAVENOUS

## 2019-10-17 MED ORDER — DEXTROSE 50 % IV SOLN: 0.0000 mL | INTRAVENOUS | Status: DC | PRN

## 2019-10-17 MED ORDER — LEVETIRACETAM IN NACL 1000 MG/100ML IV SOLN
1000.0000 mg | Freq: Two times a day (BID) | INTRAVENOUS | Status: DC
  Administered 2019-10-17 – 2019-10-19 (×4): 1000 mg via INTRAVENOUS
  Filled 2019-10-17 (×4): qty 100

## 2019-10-17 MED ORDER — STROKE: EARLY STAGES OF RECOVERY BOOK
Freq: Once | Status: AC
  Filled 2019-10-17: qty 1

## 2019-10-17 MED ORDER — INSULIN REGULAR(HUMAN) IN NACL 100-0.9 UT/100ML-% IV SOLN
INTRAVENOUS | Status: DC
  Administered 2019-10-17: 2 [IU]/h via INTRAVENOUS

## 2019-10-17 NOTE — Progress Notes (Signed)
Pt admitted from ED with diagnosis of Hyperglycemia, pt alert and oriented c/o of slight neuropathic pain in legs and arms, settled in bed with call light within pt's reach, tele monitor put and verified on pt, continuous EEG monitoring set up at bedside, safety concern addressed accordingly, was however reassured and will continue to monitor, v/s stable. [place]-Asidi, Monifah Freehling Efe

## 2019-10-17 NOTE — ED Notes (Signed)
Neurology at bedside, pt now with left sided facial droop.

## 2019-10-17 NOTE — Consult Note (Signed)
Neurology Consultation Reason for Consult: Seizures Referring Physician: Demaris Callander  CC: Seizures  History is obtained from: EMS  HPI: Seth Weeks is a 59 y.o. male with a history of hypertension, diabetes who presents with new onset seizures.  He was standing, when he suddenly lost consciousness and began having seizure-like activity per his girlfriend.  EMS was called and on their arrival he had some left-sided weakness.  En route he began having left hand clonic-like activity which then progressed to involve his left arm with subsequent secondary generalization.  He had a 1.5-minute long seizure, which aborted spontaneously.  On arrival to the emergency department, he had continued clonic activity of his left hand.  CT head was negative.  He was treated with 2 mg of IV Ativan and 3 g IV Keppra with improvement in left hand clonic activity.    ROS: A 14 point ROS was performed and is negative except as noted in the HPI.   Past Medical History:  Diagnosis Date  . CHF (congestive heart failure) ([place]) 04/11/2018  . Diabetes mellitus ([place])   . GAD (generalized anxiety disorder) 07/17/2015  . High triglycerides   . Hypertension   . MDD (major depressive disorder) 03/17/2018     Family History  Problem Relation Age of Onset  . Heart failure Mother      Social History:  reports that he has never smoked. He has never used smokeless tobacco. He reports current alcohol use. He reports that he does not use drugs.   Exam: Current vital signs: BP 137/80   Pulse 91   Temp 98.1 F (36.7 C) (Oral)   Resp (!) 24   Ht 5\' 8"  (1.727 m)   Wt 85.3 kg   SpO2 100%   BMI 28.59 kg/m  Vital signs in last 24 hours: Temp:  [98.1 F (36.7 C)] 98.1 F (36.7 C) (03/17 1318) Pulse Rate:  [90-102] 91 (03/17 1445) Resp:  [18-30] 24 (03/17 1545) BP: (124-158)/(77-92) 137/80 (03/17 1545) SpO2:  [95 %-100 %] 100 % (03/17 1445) Weight:  [85.3 kg] 85.3 kg (03/17 1300)   Physical Exam   Constitutional: Appears well-developed and well-nourished.  Psych: Affect appropriate to situation Eyes: No scleral injection HENT: No OP obstrucion MSK: no joint deformities.  Cardiovascular: Normal rate and regular rhythm.  Respiratory: Effort normal, non-labored breathing GI: Soft.  No distension. There is no tenderness.  Skin: WDI  Neuro: Mental Status: Patient is awake, alert, oriented to person, place, month, year, and situation. Patient is able to give a clear and coherent history. No signs of aphasia or neglect Cranial Nerves: II: Visual Fields are full. Pupils are equal, round, and reactive to light.   III,IV, VI: EOMI without ptosis or diploplia.  V: Facial sensation is symmetric to temperature VII: Facial movement is symmetric.  VIII: hearing is intact to voice X: Uvula elevates symmetrically XI: Shoulder shrug is symmetric. XII: tongue is midline without atrophy or fasciculations.  Motor: Tone is normal. Bulk is normal. 5/5 strength was present in bilateral legs and right hand.  His left hand when it is not having clonic activity has good strength, but most the time is intermittently spasming.  Even when it is, he is able to hold his left arm against gravity and no movement is detectable at the elbow or shoulder joints Sensory: Sensation is symmetric to light touch and temperature in the arms and legs. Cerebellar: FNF without ataxia on the right, difficulty on the left due to clonic activity.  I have reviewed labs in epic and the results pertinent to this consultation are: Glucose 818 Creatinine 1.68  I have reviewed the images obtained: CT/CTA/CTP-negative for acute findings, old strokes in the right  Impression: 59 year old male with new onset seizure activity and persistent left hand spasms.  I suspect that this is primarily due to hyperosmolar nonketotic state due to his hyperglycemia, though contribution from his previous infarct is also possible.  In either  case, he needs to be continued on antiepileptic therapy.  I would favor monitoring on EEG to ensure that he is not having intermittent subclinical spread.  Recommendations: 1) Keppra 1 g twice daily 2) treatment of hyperglycemia per internal medicine 3) continuous EEG monitoring 4) magnesium pending   Roland Rack, MD [facility] [phone]  If 7pm- 7am, please page neurology on call as listed in [place].

## 2019-10-17 NOTE — H&P (Addendum)
History and Physical    Seth Weeks OVZ:[id number] DOB: 04/06/1961 DOA: 10/17/2019  I have briefly reviewed the patient's prior medical records in [place]  PCP: Emeterio Reeve, DO  Patient coming from: home  Chief Complaint: Seizure episode, strokelike symptoms  HPI: Seth Weeks is a 59 y.o. male with medical history significant of insulin-dependent diabetes mellitus, poorly controlled, hypertension, hyperlipidemia, generalized anxiety disorder, comes to the hospital with complaints of strokelike symptoms as well as a seizure episode.  Patient currently appears quite lethargic and confused, also appears to have slurred speech and communication is very difficult.  History obtained from discussion with the EDP as well as neurology.  He presented to the hospital with new onset seizures, apparently he was standing and suddenly lost consciousness and began having seizure-like activity per his family.  EMS was called and when they arrived they saw left-sided weakness.  On arrival to the hospital he had left hand clonic like activity which progressed and had another subsequent generalized seizure episode.  It aborted spontaneously.  Neurology consulted, he was seen as a code stroke, and upon my evaluation he is undergoing a stat EEG.  He is lethargic, wakes up a little bit, his speech is slurred and very difficult to understand but his only complaint is that he needs to use the urinal.  ED Course: In the ED his vitals are stable, normotensive, satting well on room air.  Initial blood work revealed CBG of 818, creatinine 1.68, WBC 15.2.  SARS-CoV-2 was negative.  CT scan, CT angio of head and neck, CT cerebral perfusion without large vessel occlusion or hemodynamically significant stenosis, no evidence of core infarction or penumbra, but did show 2 small cortically based infarct with a left high right parietal lobe favored to be subacute to chronic.  We are asked to admit  Review of Systems: All  systems reviewed, and apart from HPI, all negative  Past Medical History:  Diagnosis Date  . CHF (congestive heart failure) ([place]) 04/11/2018  . Diabetes mellitus ([place])   . GAD (generalized anxiety disorder) 07/17/2015  . High triglycerides   . Hypertension   . MDD (major depressive disorder) 03/17/2018    Past Surgical History:  Procedure Laterality Date  . TONSILLECTOMY       reports that he has never smoked. He has never used smokeless tobacco. He reports current alcohol use. He reports that he does not use drugs.  Allergies  Allergen Reactions  . Bee Venom Anaphylaxis  . Clindamycin/Lincomycin Diarrhea    Bad diarrhea. Please do not give.   . Pregabalin Other (See Comments)    expensive    . Penicillins   . Terramycin [Oxytetracycline]     Family History  Problem Relation Age of Onset  . Heart failure Mother     Prior to Admission medications   Medication Sig Start Date End Date Taking? Authorizing Provider  alprazolam Duanne Moron) 2 MG tablet Take 1 tablet (2 mg total) by mouth 3 (three) times daily as needed. for anxiety. #90 for 30 days 10/18/19 11/17/19  Emeterio Reeve, DO  AMBULATORY NON FORMULARY MEDICATION Single glucometer of choice with lancets, test strips. Test 3x daily.  E11.65 Disp QS 1 months 05/20/17   Gregor Hams, MD  aspirin EC 81 MG tablet Take by mouth. 03/16/19 03/15/20  [provider]  buPROPion (WELLBUTRIN XL) 150 MG 24 hr tablet Take 1 tablet by mouth once daily in the morning 09/06/19   Emeterio Reeve, DO  chlorhexidine [facility])  0.12 % solution Use as directed 10 mLs in the mouth or throat 4 (four) times daily. 09/06/19   Emeterio Reeve, DO  Continuous Blood Gluc Sensor (FREESTYLE LIBRE 14 DAY SENSOR) MISC USE AS DIRECTED CHANGE  EVERY  14  DAYS 03/20/19   Gregor Hams, MD  furosemide (LASIX) 40 MG tablet Take 1 tablet (40 mg total) by mouth daily. 10/04/19   Emeterio Reeve, DO  Insulin Glargine, 2 Unit Dial, (TOUJEO MAX SOLOSTAR)  300 UNIT/ML SOPN Inject 60-100 Units as directed daily. 08/29/19   Emeterio Reeve, DO  Insulin Pen Needle 34G X 3.5 MM MISC 1 each by Does not apply route daily. 11/24/18   Gregor Hams, MD  levocetirizine (XYZAL) 5 MG tablet Take 1 tablet (5 mg total) by mouth every evening. 06/16/18   Gregor Hams, MD  loperamide (IMODIUM A-D) 2 MG tablet Take 1 tablet (2 mg total) by mouth 4 (four) times daily as needed for diarrhea or loose stools. 06/16/18   Gregor Hams, MD  losartan (COZAAR) 25 MG tablet Take by mouth. 03/16/19   [provider]  metoprolol succinate (TOPROL-XL) 50 MG 24 hr tablet Take 50 mg by mouth daily. 04/18/18   [provider]  Multiple Vitamin (MULTI-VITAMIN) tablet Take by mouth.    [provider]  NARCAN 4 MG/0.1ML LIQD nasal spray kit  12/16/18   [provider]  nitroGLYCERIN (NITROSTAT) 0.4 MG SL tablet PLACE 1 TABLET (0.4 MG TOTAL) UNDER THE TONGUE EVERY 5 (FIVE) MINUTES AS NEEDED FOR CHEST PAIN. Patient not taking: Reported on 09/26/2019 06/12/19   Silverio Decamp, MD  omeprazole (PRILOSEC) 40 MG capsule Take 1 capsule (40 mg total) by mouth daily. 03/16/19   Gregor Hams, MD  ondansetron (ZOFRAN) 8 MG tablet Take 0.5-1 tablets (4-8 mg total) by mouth every 8 (eight) hours as needed for nausea or vomiting. 07/13/19   Hali Marry, MD  promethazine (PHENERGAN) 25 MG tablet Take 1 tablet (25 mg total) by mouth every 8 (eight) hours as needed for nausea or vomiting. 04/27/19   Gregor Hams, MD    Physical Exam: Vitals:   10/17/19 1500 10/17/19 1515 10/17/19 1530 10/17/19 1545  BP: 134/85 138/87 130/87 137/80  Pulse:      Resp: (!) 27 (!) 30 (!) 30 (!) 24  Temp:      TempSrc:      SpO2:      Weight:      Height:       Constitutional: NAD, lethargic Eyes: PERRL, lids and conjunctivae normal ENMT: Mucous membranes are moist. Neck: normal, supple Respiratory: clear to auscultation bilaterally, no wheezing, no  crackles. Normal respiratory effort. Cardiovascular: Regular rate and rhythm, no murmurs / rubs / gallops. No extremity edema. 2+ pedal pulses.  Abdomen: no tenderness, no masses palpated. Bowel sounds positive.  Musculoskeletal: no clubbing / cyanosis. Normal muscle tone.  Skin: no rashes, lesions, ulcers. No induration Neurologic: Grossly nonfocal, he is somewhat slow to follow commands but he is overall weak but appears to be equal  Labs on Admission: I have personally reviewed following labs and imaging studies  CBC: Recent Labs  Lab 10/17/19 1314 10/17/19 1316  WBC 15.2*  --   NEUTROABS 13.6*  --   HGB 14.3 15.0  HCT 43.2 44.0  MCV 83.7  --   PLT 202  --    Basic Metabolic Panel: Recent Labs  Lab 10/17/19 1314 10/17/19 1316  NA 125* 127*  K 4.8 4.8  CL 90* 92*  CO2 17*  --   GLUCOSE 818* >700*  BUN 33* 33*  CREATININE 1.68* 1.40*  CALCIUM 9.0  --    Liver Function Tests: Recent Labs  Lab 10/17/19 1314  AST 41  ALT 35  ALKPHOS 109  BILITOT 0.9  PROT 7.2  ALBUMIN 3.6   Coagulation Profile: Recent Labs  Lab 10/17/19 1314  INR 1.0   BNP (last 3 results) No results for input(s): PROBNP in the last 8760 hours. CBG: Recent Labs  Lab 10/17/19 1311 10/17/19 1520 10/17/19 1556  GLUCAP >600* 459* 383*   Thyroid Function Tests: No results for input(s): TSH, T4TOTAL, FREET4, T3FREE, THYROIDAB in the last 72 hours. Urine analysis:    Component Value Date/Time   COLORURINE STRAW (A) 10/17/2019 1334   APPEARANCEUR CLEAR 10/17/2019 1334   LABSPEC 1.025 10/17/2019 1334   PHURINE 5.0 10/17/2019 1334   GLUCOSEU >=500 (A) 10/17/2019 1334   HGBUR NEGATIVE 10/17/2019 1334   BILIRUBINUR NEGATIVE 10/17/2019 1334   KETONESUR NEGATIVE 10/17/2019 1334   PROTEINUR NEGATIVE 10/17/2019 1334   NITRITE NEGATIVE 10/17/2019 1334   LEUKOCYTESUR NEGATIVE 10/17/2019 1334     Radiological Exams on Admission: CT Code Stroke CTA Head W/WO contrast  Result Date:  10/17/2019 CLINICAL DATA:  Code stroke follow-up EXAM: CT ANGIOGRAPHY HEAD AND NECK CT PERFUSION BRAIN TECHNIQUE: Multidetector CT imaging of the head and neck was performed using the standard protocol during bolus administration of intravenous contrast. Multiplanar CT image reconstructions and MIPs were obtained to evaluate the vascular anatomy. Carotid stenosis measurements (when applicable) are obtained utilizing NASCET criteria, using the distal internal carotid diameter as the denominator. Multiphase CT imaging of the brain was performed following IV bolus contrast injection. Subsequent parametric perfusion maps were calculated using RAPID software. CONTRAST:  154m OMNIPAQUE IOHEXOL 350 MG/ML SOLN COMPARISON:  None. FINDINGS: Suboptimal contrast bolus timing with significant venous enhancement. CTA NECK FINDINGS Aortic arch: Great vessel origins are patent. Right carotid system: Patent. Atherosclerotic wall thickening along the common carotid. Mild calcified and noncalcified plaque at the ICA origin causing minimal stenosis. Left carotid system: Patent. Atherosclerotic wall thickening along the common carotid. There is calcified and noncalcified plaque at the proximal ICA causing minimal stenosis. Vertebral arteries: Patent.  Left vertebral artery is dominant. Skeleton: Degenerative changes of the cervical spine. Other neck: There is a 1 cm nodule of the left thyroid lobe. No followup recommended (ref: J Am Coll Radiol. 2015 Feb;12(2): 143-50). Upper chest: No apical lung mass. Review of the MIP images confirms the above findings CTA HEAD FINDINGS Anterior circulation: Intracranial internal carotid arteries are patent with minor calcified plaque. Anterior and middle cerebral arteries are patent. There is likely atherosclerotic irregularity superimposed on artifact. Posterior circulation: Intracranial left vertebral artery is patent with calcified plaque causing mild stenosis. Right vertebral artery terminates  as a PICA. Basilar artery is patent. Posterior cerebral arteries are patent. There is likely atherosclerotic irregularity superimposed artifact. Venous sinuses: As permitted by contrast timing, patent. Review of the MIP images confirms the above findings CT Brain Perfusion Findings: CBF (<30%) Volume: 044mPerfusion (Tmax>6.0s) volume: 64m64mismatch Volume: 64mL44mfarction Location:None IMPRESSION: Suboptimal contrast bolus timing with significant venous enhancement. There is no large vessel occlusion or hemodynamically significant stenosis. Probable mild intracranial atherosclerotic irregularity. Perfusion imaging does not demonstrate any evidence of core infarction or penumbra. Electronically Signed   By: PranMacy Mis.   On: 10/17/2019 15:07   CT Code Stroke CTA  Neck W/WO contrast  Result Date: 10/17/2019 CLINICAL DATA:  Code stroke follow-up EXAM: CT ANGIOGRAPHY HEAD AND NECK CT PERFUSION BRAIN TECHNIQUE: Multidetector CT imaging of the head and neck was performed using the standard protocol during bolus administration of intravenous contrast. Multiplanar CT image reconstructions and MIPs were obtained to evaluate the vascular anatomy. Carotid stenosis measurements (when applicable) are obtained utilizing NASCET criteria, using the distal internal carotid diameter as the denominator. Multiphase CT imaging of the brain was performed following IV bolus contrast injection. Subsequent parametric perfusion maps were calculated using RAPID software. CONTRAST:  166m OMNIPAQUE IOHEXOL 350 MG/ML SOLN COMPARISON:  None. FINDINGS: Suboptimal contrast bolus timing with significant venous enhancement. CTA NECK FINDINGS Aortic arch: Great vessel origins are patent. Right carotid system: Patent. Atherosclerotic wall thickening along the common carotid. Mild calcified and noncalcified plaque at the ICA origin causing minimal stenosis. Left carotid system: Patent. Atherosclerotic wall thickening along the common carotid.  There is calcified and noncalcified plaque at the proximal ICA causing minimal stenosis. Vertebral arteries: Patent.  Left vertebral artery is dominant. Skeleton: Degenerative changes of the cervical spine. Other neck: There is a 1 cm nodule of the left thyroid lobe. No followup recommended (ref: J Am Coll Radiol. 2015 Feb;12(2): 143-50). Upper chest: No apical lung mass. Review of the MIP images confirms the above findings CTA HEAD FINDINGS Anterior circulation: Intracranial internal carotid arteries are patent with minor calcified plaque. Anterior and middle cerebral arteries are patent. There is likely atherosclerotic irregularity superimposed on artifact. Posterior circulation: Intracranial left vertebral artery is patent with calcified plaque causing mild stenosis. Right vertebral artery terminates as a PICA. Basilar artery is patent. Posterior cerebral arteries are patent. There is likely atherosclerotic irregularity superimposed artifact. Venous sinuses: As permitted by contrast timing, patent. Review of the MIP images confirms the above findings CT Brain Perfusion Findings: CBF (<30%) Volume: 068mPerfusion (Tmax>6.0s) volume: 39m41mismatch Volume: 39mL62mfarction Location:None IMPRESSION: Suboptimal contrast bolus timing with significant venous enhancement. There is no large vessel occlusion or hemodynamically significant stenosis. Probable mild intracranial atherosclerotic irregularity. Perfusion imaging does not demonstrate any evidence of core infarction or penumbra. Electronically Signed   By: PranMacy Mis.   On: 10/17/2019 15:07   CT Code Stroke Cerebral Perfusion with contrast  Result Date: 10/17/2019 CLINICAL DATA:  Code stroke follow-up EXAM: CT ANGIOGRAPHY HEAD AND NECK CT PERFUSION BRAIN TECHNIQUE: Multidetector CT imaging of the head and neck was performed using the standard protocol during bolus administration of intravenous contrast. Multiplanar CT image reconstructions and MIPs were  obtained to evaluate the vascular anatomy. Carotid stenosis measurements (when applicable) are obtained utilizing NASCET criteria, using the distal internal carotid diameter as the denominator. Multiphase CT imaging of the brain was performed following IV bolus contrast injection. Subsequent parametric perfusion maps were calculated using RAPID software. CONTRAST:  1039mL239mIPAQUE IOHEXOL 350 MG/ML SOLN COMPARISON:  None. FINDINGS: Suboptimal contrast bolus timing with significant venous enhancement. CTA NECK FINDINGS Aortic arch: Great vessel origins are patent. Right carotid system: Patent. Atherosclerotic wall thickening along the common carotid. Mild calcified and noncalcified plaque at the ICA origin causing minimal stenosis. Left carotid system: Patent. Atherosclerotic wall thickening along the common carotid. There is calcified and noncalcified plaque at the proximal ICA causing minimal stenosis. Vertebral arteries: Patent.  Left vertebral artery is dominant. Skeleton: Degenerative changes of the cervical spine. Other neck: There is a 1 cm nodule of the left thyroid lobe. No followup recommended (ref: J Am Coll Radiol. 2015  Feb;12(2): 143-50). Upper chest: No apical lung mass. Review of the MIP images confirms the above findings CTA HEAD FINDINGS Anterior circulation: Intracranial internal carotid arteries are patent with minor calcified plaque. Anterior and middle cerebral arteries are patent. There is likely atherosclerotic irregularity superimposed on artifact. Posterior circulation: Intracranial left vertebral artery is patent with calcified plaque causing mild stenosis. Right vertebral artery terminates as a PICA. Basilar artery is patent. Posterior cerebral arteries are patent. There is likely atherosclerotic irregularity superimposed artifact. Venous sinuses: As permitted by contrast timing, patent. Review of the MIP images confirms the above findings CT Brain Perfusion Findings: CBF (<30%) Volume: 65m  Perfusion (Tmax>6.0s) volume: 035mMismatch Volume: 47m53mnfarction Location:None IMPRESSION: Suboptimal contrast bolus timing with significant venous enhancement. There is no large vessel occlusion or hemodynamically significant stenosis. Probable mild intracranial atherosclerotic irregularity. Perfusion imaging does not demonstrate any evidence of core infarction or penumbra. Electronically Signed   By: PraMacy MisD.   On: 10/17/2019 15:07   CT HEAD CODE STROKE WO CONTRAST  Result Date: 10/17/2019 CLINICAL DATA:  Code stroke. Neuro deficit, acute, stroke suspected. Possible stroke. Additional history provided: Last known well 11 a.m., left hand and left foot, slurred speech, aphasia EXAM: CT HEAD WITHOUT CONTRAST TECHNIQUE: Contiguous axial images were obtained from the base of the skull through the vertex without intravenous contrast. COMPARISON:  No pertinent prior studies available for comparison. FINDINGS: Brain: There is no evidence of acute intracranial hemorrhage. There are two small cortically based infarcts within the high right parietal lobe which appear subacute to chronic (series 3, images 20-25) (series 6, image 23). Gray-white differentiation elsewhere appears preserved. There is no evidence of intracranial mass. No midline shift or extra-axial fluid collection. Mild generalized parenchymal atrophy. Vascular: No hyperdense vessel.  Atherosclerotic calcifications. Skull: Normal. Negative for fracture or focal lesion. Sinuses/Orbits: Visualized orbits demonstrate no acute abnormality. Mild ethmoid and maxillary sinus mucosal thickening. No significant mastoid effusion These results were called by telephone at the time of interpretation on 10/17/2019 at 1:43 pm to provider Dr. KirLeonel RamsayWho verbally acknowledged these results. IMPRESSION: No evidence of acute intracranial hemorrhage. There are two small cortically based infarcts within the high right parietal lobe which are favored subacute  to chronic. Consider brain MRI for confirmation, as clinically warranted. Mild generalized parenchymal atrophy. Electronically Signed   By: KylKellie Simmering   On: 10/17/2019 13:45   EKG: Independently reviewed.  Sinus tachycardia  Assessment/Plan  Principal Problem Seizure/strokelike symptoms -Neurology consulted, appreciate input.  Currently undergoing continuous EEG monitoring, his left hand clonic activity appears to have subsided -Further work-up per neurology, may eventually need an MRI once the EEG is done -Hold off on ordering 2D echo unless MRI is positive for CVA -Potentially related to significant hyperglycemia -started on keppra, continue per neurology  Active Problems DKA in the setting of poorly controlled diabetes mellitus with hyperglycemia -Most recent A1c in January 2021 was greater than 14 -Placed on insulin infusion, BMP every 4 hours, as per protocol  Acute kidney injury -Likely in the setting of dehydration/DKA, monitor renal function with hydration  Essential hypertension -Hold home medications due to AKI and allow permissive hypertension until stroke is worked up  Elevated WBC -White count 15, no clear evidence of infection, likely reactive, monitor  GAD -Continue home medications when no longer n.p.o.  DVT prophylaxis: Lovenox Code Status: Presumed full code Family Communication: No family present Disposition Plan: Likely home when improved Bed Type: Progressive Consults called: Neurology Obs/Inp:  Observation    Marzetta Board, MD, PhD [facility]  Contact via [email or web address]  10/17/2019, 4:30 PM

## 2019-10-17 NOTE — Progress Notes (Signed)
Pt now has three consecutive CBG goal range, currently on IV Insulin infusing at 0.58ml/hr, NP Forrest Moron paged for more orders, pt quiet in bed, will however continue to monitor. Seth Weeks, Seth Weeks

## 2019-10-17 NOTE — ED Notes (Signed)
EEG at bedside.

## 2019-10-17 NOTE — ED Notes (Signed)
To CT

## 2019-10-17 NOTE — Progress Notes (Signed)
LTM EEG hooked up and running - no initial skin breakdown - push button tested - neuro notified.  Same leads used.  

## 2019-10-17 NOTE — Progress Notes (Signed)
STAT EEG complete - results pending LTM to follow same leads will be used

## 2019-10-17 NOTE — ED Notes (Signed)
Seizure pads placed on bed

## 2019-10-17 NOTE — Procedures (Signed)
Patient Name: Seth Weeks  MRN: [id number]  Epilepsy Attending: Lora Havens  Referring Physician/Provider: Dr. Kathrynn Speed Date: 10/17/2019 Duration: 25.36 mins  Patient history: 59 year old male with history of diabetes (CBG more than 700) who presented after an episode of left-sided weakness and seizure-like activity.  EEG evaluate for seizures.  Level of alertness: awake, asleep  AEDs during EEG study: Keppra, Ativan  Technical aspects: This EEG study was done with scalp electrodes positioned according to the 10-20 International system of electrode placement. Electrical activity was acquired at a sampling rate of 500Hz  and reviewed with a high frequency filter of 70Hz  and a low frequency filter of 1Hz . EEG data were recorded continuously and digitally stored.   Description: No clear posterior dominant rhythm was seen. Sleep was characterized by sleep spindles (12-14hz ), maximal frontocentral.  EEG showed continuous generalized low amplitude 6-9hz  theta-alpha activity.  Hyperventilation and photic stimulation were not performed.  Abnormality - Continuous slow, generalized   IMPRESSION: This study is suggestive of mild to moderate diffuse encephalopathy, non specific to etiology. No seizures or epileptiform discharges were seen throughout the recording.  Seth Weeks Seth Weeks

## 2019-10-17 NOTE — Code Documentation (Addendum)
Stroke Response Nurse Documentation Code Documentation  Seth Weeks is a 59 y.o. male arriving to [facility] ED via [facility] on 10/17/19 with past medical hx of DM, HTN, CHF, anxiety, depression. Code stroke was activated by EMS. Patient was at a hotel visiting friend where he was LKW at 29 and now complaining of left sided weakness, fall and had seizure activity. On No antithrombotic. Stroke team at the bedside on patient arrival. Labs drawn and patient cleared for CT by EDP. Patient to CT with team. NIHSS 3, see documentation for details and code stroke times.  The following imaging was completed:CT. 2 mg of Ativan and 2g of Keppra given. Pt's CBG greater than 700. Patient is not a candidate for tPA due to stroke not suspected r/t seizure activity. Care/Plan stat EEG, insulin gtt, and fluids started.  Bedside handoff with ED RN Clydene Fake.     Reassessed pt with Neuro MD at bedside and noticed a new left facial droop with worsening dysarthria. EEG started and paused for a CTA/CTP. RN updated.   Debralee Braaksma, Rande Brunt  Stroke Response RN

## 2019-10-17 NOTE — ED Provider Notes (Signed)
[facility] EMERGENCY DEPARTMENT Provider Note   CSN: [id number] Arrival date & time: 10/17/19  1309  An emergency department physician performed an initial assessment on this suspected stroke patient at 1310.  History Chief Complaint  Patient presents with  . Hyperglycemia  . Code Stroke    Seth Weeks is a 59 y.o. male.  59 year old male with medical history as described below presents for evaluation of reported seizure-like activity.  Patient seen as a code stroke immediately by neuro.   Patient with seizure-like activity prior to arrival.  Patient with AMS on arrival.  Level 5 caveat secondary to patient's AMS.  Additional history is difficult to obtain from the patient.   The history is provided by the patient and medical records.  Illness Location:  Seizure-like activity, AMS Severity:  Moderate Onset quality:  Unable to specify Timing:  Rare Progression:  Unchanged Chronicity:  New      Past Medical History:  Diagnosis Date  . CHF (congestive heart failure) ([place]) 04/11/2018  . Diabetes mellitus ([place])   . GAD (generalized anxiety disorder) 07/17/2015  . High triglycerides   . Hypertension   . MDD (major depressive disorder) 03/17/2018    Patient Active Problem List   Diagnosis Date Noted  . Acute respiratory failure with hypoxia ([place]) 07/26/2019  . Acute kidney failure ([place]) 07/24/2019  . Elevated troponin 07/24/2019  . Hyperlipidemia 03/16/2019  . Onychomycosis of multiple toenails with type 2 diabetes mellitus ([place]) 11/30/2018  . Hypertension associated with diabetes ([place]) 11/24/2018  . Coronary artery disease involving native coronary artery of native heart without angina pectoris 04/14/2018  . Chronic systolic congestive heart failure ([place]) 04/11/2018  . Nonischemic cardiomyopathy ([place]) 04/11/2018  . Pericardial effusion 04/11/2018  . Tachycardia 04/11/2018  . MDD (major depressive disorder) 03/17/2018  . Metatarsalgia 03/17/2018   . Carpal tunnel syndrome 03/17/2018  . Peripheral neuropathy 06/16/2017  . Type 2 diabetes mellitus with diabetic neuropathy, unspecified ([place]) 06/16/2017  . Chronic pain 04/26/2017  . Bilateral tennis elbow 03/22/2017  . Impingement syndrome of right shoulder region 06/28/2016  . Skin lesion 03/03/2016  . GAD (generalized anxiety disorder) 07/17/2015  . Pes cavus 01/28/2015  . Umbilical hernia 39/76/7341  . Benign hypertension with coincident congestive heart failure ([place]) 10/19/2013  . BPV (benign positional vertigo) 07/23/2013  . Type 2 diabetes mellitus with peripheral neuropathy ([place]) 05/25/2013  . Erectile dysfunction 05/25/2013    Past Surgical History:  Procedure Laterality Date  . TONSILLECTOMY         Family History  Problem Relation Age of Onset  . Heart failure Mother     Social History   Tobacco Use  . Smoking status: Never Smoker  . Smokeless tobacco: Never Used  Substance Use Topics  . Alcohol use: Yes  . Drug use: No    Home Medications Prior to Admission medications   Medication Sig Start Date End Date Taking? Authorizing Provider  alprazolam Duanne Moron) 2 MG tablet Take 1 tablet (2 mg total) by mouth 3 (three) times daily as needed. for anxiety. #90 for 30 days 10/18/19 11/17/19  Emeterio Reeve, DO  AMBULATORY NON FORMULARY MEDICATION Single glucometer of choice with lancets, test strips. Test 3x daily.  E11.65 Disp QS 1 months 05/20/17   Gregor Hams, MD  aspirin EC 81 MG tablet Take by mouth. 03/16/19 03/15/20  [provider]  buPROPion (WELLBUTRIN XL) 150 MG 24 hr tablet Take 1 tablet by mouth once daily in the morning 09/06/19  Emeterio Reeve, DO  chlorhexidine (PERIDEX) 0.12 % solution Use as directed 10 mLs in the mouth or throat 4 (four) times daily. 09/06/19   Emeterio Reeve, DO  Continuous Blood Gluc Sensor (FREESTYLE LIBRE 14 DAY SENSOR) MISC USE AS DIRECTED CHANGE  EVERY  14  DAYS 03/20/19   Gregor Hams, MD  furosemide  (LASIX) 40 MG tablet Take 1 tablet (40 mg total) by mouth daily. 10/04/19   Emeterio Reeve, DO  Insulin Glargine, 2 Unit Dial, (TOUJEO MAX SOLOSTAR) 300 UNIT/ML SOPN Inject 60-100 Units as directed daily. 08/29/19   Emeterio Reeve, DO  Insulin Pen Needle 34G X 3.5 MM MISC 1 each by Does not apply route daily. 11/24/18   Gregor Hams, MD  levocetirizine (XYZAL) 5 MG tablet Take 1 tablet (5 mg total) by mouth every evening. 06/16/18   Gregor Hams, MD  loperamide (IMODIUM A-D) 2 MG tablet Take 1 tablet (2 mg total) by mouth 4 (four) times daily as needed for diarrhea or loose stools. 06/16/18   Gregor Hams, MD  losartan (COZAAR) 25 MG tablet Take by mouth. 03/16/19   [provider]  metoprolol succinate (TOPROL-XL) 50 MG 24 hr tablet Take 50 mg by mouth daily. 04/18/18   [provider]  Multiple Vitamin (MULTI-VITAMIN) tablet Take by mouth.    [provider]  NARCAN 4 MG/0.1ML LIQD nasal spray kit  12/16/18   [provider]  nitroGLYCERIN (NITROSTAT) 0.4 MG SL tablet PLACE 1 TABLET (0.4 MG TOTAL) UNDER THE TONGUE EVERY 5 (FIVE) MINUTES AS NEEDED FOR CHEST PAIN. Patient not taking: Reported on 09/26/2019 06/12/19   Silverio Decamp, MD  omeprazole (PRILOSEC) 40 MG capsule Take 1 capsule (40 mg total) by mouth daily. 03/16/19   Gregor Hams, MD  ondansetron (ZOFRAN) 8 MG tablet Take 0.5-1 tablets (4-8 mg total) by mouth every 8 (eight) hours as needed for nausea or vomiting. 07/13/19   Hali Marry, MD  promethazine (PHENERGAN) 25 MG tablet Take 1 tablet (25 mg total) by mouth every 8 (eight) hours as needed for nausea or vomiting. 04/27/19   Gregor Hams, MD    Allergies    Bee venom, Clindamycin/lincomycin, Pregabalin, Penicillins, and Terramycin [oxytetracycline]  Review of Systems   Review of Systems  All other systems reviewed and are negative.   Physical Exam Updated Vital Signs BP 137/80   Pulse 91   Temp 98.1 F (36.7 C)  (Oral)   Resp (!) 24   Ht _0  (1.727 m)   Wt 85.3 kg   SpO2 100%   BMI 28.59 kg/m   Physical Exam Vitals and nursing note reviewed.  Constitutional:      General: He is not in acute distress.    Appearance: He is well-developed.  HENT:     Head: Normocephalic and atraumatic.  Eyes:     Conjunctiva/sclera: Conjunctivae normal.     Pupils: Pupils are equal, round, and reactive to light.  Cardiovascular:     Rate and Rhythm: Normal rate and regular rhythm.     Heart sounds: Normal heart sounds.  Pulmonary:     Effort: Pulmonary effort is normal. No respiratory distress.     Breath sounds: Normal breath sounds.  Abdominal:     General: There is no distension.     Palpations: Abdomen is soft.     Tenderness: There is no abdominal tenderness.  Musculoskeletal:        General: No deformity. Normal  range of motion.     Cervical back: Normal range of motion and neck supple.  Skin:    General: Skin is warm and dry.  Neurological:     Mental Status: He is alert.     Comments: Confused and oriented only to person.  Slurred speech.  No facial droop noted  Full active range of motion of all 4 extremities       ED Results / Procedures / Treatments   Labs (all labs ordered are listed, but only abnormal results are displayed) Labs Reviewed  CBC - Abnormal; Notable for the following components:      Result Value   WBC 15.2 (*)    All other components within normal limits  DIFFERENTIAL - Abnormal; Notable for the following components:   Neutro Abs 13.6 (*)    Lymphs Abs 0.5 (*)    All other components within normal limits  COMPREHENSIVE METABOLIC PANEL - Abnormal; Notable for the following components:   Sodium 125 (*)    Chloride 90 (*)    CO2 17 (*)    Glucose, Bld 818 (*)    BUN 33 (*)    Creatinine, Ser 1.68 (*)    GFR calc non Af Amer 44 (*)    GFR calc Af Amer 51 (*)    Anion gap 18 (*)    All other components within normal limits  URINALYSIS, ROUTINE W REFLEX  MICROSCOPIC - Abnormal; Notable for the following components:   Color, Urine STRAW (*)    Glucose, UA >=500 (*)    All other components within normal limits  I-STAT CHEM 8, ED - Abnormal; Notable for the following components:   Sodium 127 (*)    Chloride 92 (*)    BUN 33 (*)    Creatinine, Ser 1.40 (*)    Glucose, Bld >700 (*)    Calcium, Ion 1.12 (*)    TCO2 19 (*)    All other components within normal limits  CBG MONITORING, ED - Abnormal; Notable for the following components:   Glucose-Capillary >600 (*)    All other components within normal limits  CBG MONITORING, ED - Abnormal; Notable for the following components:   Glucose-Capillary 459 (*)    All other components within normal limits  CBG MONITORING, ED - Abnormal; Notable for the following components:   Glucose-Capillary 383 (*)    All other components within normal limits  RESPIRATORY PANEL BY RT PCR (FLU A&B, COVID)  PROTIME-INR  APTT  RAPID URINE DRUG SCREEN, HOSP PERFORMED  ETHANOL  MAGNESIUM  PHOSPHORUS    EKG EKG Interpretation  Date/Time:  Wednesday October 17 2019 13:31:03 EDT Ventricular Rate:  102 PR Interval:    QRS Duration: 123 QT Interval:  368 QTC Calculation: 480 R Axis:   -63 Text Interpretation: Sinus tachycardia Prolonged PR interval Probable left atrial enlargement Left bundle branch block Confirmed by Dene Gentry [phone]) on 10/17/2019 2:04:39 PM Also confirmed by Dene Gentry [phone]), editor Hattie Perch ([id number])  on 10/17/2019 2:07:16 PM   Radiology CT Code Stroke CTA Head W/WO contrast  Result Date: 10/17/2019 CLINICAL DATA:  Code stroke follow-up EXAM: CT ANGIOGRAPHY HEAD AND NECK CT PERFUSION BRAIN TECHNIQUE: Multidetector CT imaging of the head and neck was performed using the standard protocol during bolus administration of intravenous contrast. Multiplanar CT image reconstructions and MIPs were obtained to evaluate the vascular anatomy. Carotid stenosis measurements (when  applicable) are obtained utilizing NASCET criteria, using the distal internal carotid diameter as  the denominator. Multiphase CT imaging of the brain was performed following IV bolus contrast injection. Subsequent parametric perfusion maps were calculated using RAPID software. CONTRAST:  174m OMNIPAQUE IOHEXOL 350 MG/ML SOLN COMPARISON:  None. FINDINGS: Suboptimal contrast bolus timing with significant venous enhancement. CTA NECK FINDINGS Aortic arch: Great vessel origins are patent. Right carotid system: Patent. Atherosclerotic wall thickening along the common carotid. Mild calcified and noncalcified plaque at the ICA origin causing minimal stenosis. Left carotid system: Patent. Atherosclerotic wall thickening along the common carotid. There is calcified and noncalcified plaque at the proximal ICA causing minimal stenosis. Vertebral arteries: Patent.  Left vertebral artery is dominant. Skeleton: Degenerative changes of the cervical spine. Other neck: There is a 1 cm nodule of the left thyroid lobe. No followup recommended (ref: J Am Coll Radiol. 2015 Feb;12(2): 143-50). Upper chest: No apical lung mass. Review of the MIP images confirms the above findings CTA HEAD FINDINGS Anterior circulation: Intracranial internal carotid arteries are patent with minor calcified plaque. Anterior and middle cerebral arteries are patent. There is likely atherosclerotic irregularity superimposed on artifact. Posterior circulation: Intracranial left vertebral artery is patent with calcified plaque causing mild stenosis. Right vertebral artery terminates as a PICA. Basilar artery is patent. Posterior cerebral arteries are patent. There is likely atherosclerotic irregularity superimposed artifact. Venous sinuses: As permitted by contrast timing, patent. Review of the MIP images confirms the above findings CT Brain Perfusion Findings: CBF (<30%) Volume: 054mPerfusion (Tmax>6.0s) volume: 60m27mismatch Volume: 60mL16mfarction  Location:None IMPRESSION: Suboptimal contrast bolus timing with significant venous enhancement. There is no large vessel occlusion or hemodynamically significant stenosis. Probable mild intracranial atherosclerotic irregularity. Perfusion imaging does not demonstrate any evidence of core infarction or penumbra. Electronically Signed   By: PranMacy Mis.   On: 10/17/2019 15:07   CT Code Stroke CTA Neck W/WO contrast  Result Date: 10/17/2019 CLINICAL DATA:  Code stroke follow-up EXAM: CT ANGIOGRAPHY HEAD AND NECK CT PERFUSION BRAIN TECHNIQUE: Multidetector CT imaging of the head and neck was performed using the standard protocol during bolus administration of intravenous contrast. Multiplanar CT image reconstructions and MIPs were obtained to evaluate the vascular anatomy. Carotid stenosis measurements (when applicable) are obtained utilizing NASCET criteria, using the distal internal carotid diameter as the denominator. Multiphase CT imaging of the brain was performed following IV bolus contrast injection. Subsequent parametric perfusion maps were calculated using RAPID software. CONTRAST:  1060mL160mIPAQUE IOHEXOL 350 MG/ML SOLN COMPARISON:  None. FINDINGS: Suboptimal contrast bolus timing with significant venous enhancement. CTA NECK FINDINGS Aortic arch: Great vessel origins are patent. Right carotid system: Patent. Atherosclerotic wall thickening along the common carotid. Mild calcified and noncalcified plaque at the ICA origin causing minimal stenosis. Left carotid system: Patent. Atherosclerotic wall thickening along the common carotid. There is calcified and noncalcified plaque at the proximal ICA causing minimal stenosis. Vertebral arteries: Patent.  Left vertebral artery is dominant. Skeleton: Degenerative changes of the cervical spine. Other neck: There is a 1 cm nodule of the left thyroid lobe. No followup recommended (ref: J Am Coll Radiol. 2015 Feb;12(2): 143-50). Upper chest: No apical lung  mass. Review of the MIP images confirms the above findings CTA HEAD FINDINGS Anterior circulation: Intracranial internal carotid arteries are patent with minor calcified plaque. Anterior and middle cerebral arteries are patent. There is likely atherosclerotic irregularity superimposed on artifact. Posterior circulation: Intracranial left vertebral artery is patent with calcified plaque causing mild stenosis. Right vertebral artery terminates as a PICA. Basilar artery is patent. Posterior  cerebral arteries are patent. There is likely atherosclerotic irregularity superimposed artifact. Venous sinuses: As permitted by contrast timing, patent. Review of the MIP images confirms the above findings CT Brain Perfusion Findings: CBF (<30%) Volume: 64m Perfusion (Tmax>6.0s) volume: 037mMismatch Volume: 15m57mnfarction Location:None IMPRESSION: Suboptimal contrast bolus timing with significant venous enhancement. There is no large vessel occlusion or hemodynamically significant stenosis. Probable mild intracranial atherosclerotic irregularity. Perfusion imaging does not demonstrate any evidence of core infarction or penumbra. Electronically Signed   By: PraMacy MisD.   On: 10/17/2019 15:07   CT Code Stroke Cerebral Perfusion with contrast  Result Date: 10/17/2019 CLINICAL DATA:  Code stroke follow-up EXAM: CT ANGIOGRAPHY HEAD AND NECK CT PERFUSION BRAIN TECHNIQUE: Multidetector CT imaging of the head and neck was performed using the standard protocol during bolus administration of intravenous contrast. Multiplanar CT image reconstructions and MIPs were obtained to evaluate the vascular anatomy. Carotid stenosis measurements (when applicable) are obtained utilizing NASCET criteria, using the distal internal carotid diameter as the denominator. Multiphase CT imaging of the brain was performed following IV bolus contrast injection. Subsequent parametric perfusion maps were calculated using RAPID software. CONTRAST:   1015m83mNIPAQUE IOHEXOL 350 MG/ML SOLN COMPARISON:  None. FINDINGS: Suboptimal contrast bolus timing with significant venous enhancement. CTA NECK FINDINGS Aortic arch: Great vessel origins are patent. Right carotid system: Patent. Atherosclerotic wall thickening along the common carotid. Mild calcified and noncalcified plaque at the ICA origin causing minimal stenosis. Left carotid system: Patent. Atherosclerotic wall thickening along the common carotid. There is calcified and noncalcified plaque at the proximal ICA causing minimal stenosis. Vertebral arteries: Patent.  Left vertebral artery is dominant. Skeleton: Degenerative changes of the cervical spine. Other neck: There is a 1 cm nodule of the left thyroid lobe. No followup recommended (ref: J Am Coll Radiol. 2015 Feb;12(2): 143-50). Upper chest: No apical lung mass. Review of the MIP images confirms the above findings CTA HEAD FINDINGS Anterior circulation: Intracranial internal carotid arteries are patent with minor calcified plaque. Anterior and middle cerebral arteries are patent. There is likely atherosclerotic irregularity superimposed on artifact. Posterior circulation: Intracranial left vertebral artery is patent with calcified plaque causing mild stenosis. Right vertebral artery terminates as a PICA. Basilar artery is patent. Posterior cerebral arteries are patent. There is likely atherosclerotic irregularity superimposed artifact. Venous sinuses: As permitted by contrast timing, patent. Review of the MIP images confirms the above findings CT Brain Perfusion Findings: CBF (<30%) Volume: 15mL 33mfusion (Tmax>6.0s) volume: 15mL M79match Volume: 15mL In56mction Location:None IMPRESSION: Suboptimal contrast bolus timing with significant venous enhancement. There is no large vessel occlusion or hemodynamically significant stenosis. Probable mild intracranial atherosclerotic irregularity. Perfusion imaging does not demonstrate any evidence of core infarction  or penumbra. Electronically Signed   By: PraneilMacy Mis On: 10/17/2019 15:07   CT HEAD CODE STROKE WO CONTRAST  Result Date: 10/17/2019 CLINICAL DATA:  Code stroke. Neuro deficit, acute, stroke suspected. Possible stroke. Additional history provided: Last known well 11 a.m., left hand and left foot, slurred speech, aphasia EXAM: CT HEAD WITHOUT CONTRAST TECHNIQUE: Contiguous axial images were obtained from the base of the skull through the vertex without intravenous contrast. COMPARISON:  No pertinent prior studies available for comparison. FINDINGS: Brain: There is no evidence of acute intracranial hemorrhage. There are two small cortically based infarcts within the high right parietal lobe which appear subacute to chronic (series 3, images 20-25) (series 6, image 23). Gray-white differentiation elsewhere appears preserved. There is no evidence  of intracranial mass. No midline shift or extra-axial fluid collection. Mild generalized parenchymal atrophy. Vascular: No hyperdense vessel.  Atherosclerotic calcifications. Skull: Normal. Negative for fracture or focal lesion. Sinuses/Orbits: Visualized orbits demonstrate no acute abnormality. Mild ethmoid and maxillary sinus mucosal thickening. No significant mastoid effusion These results were called by telephone at the time of interpretation on 10/17/2019 at 1:43 pm to provider Dr. Leonel Ramsay., Who verbally acknowledged these results. IMPRESSION: No evidence of acute intracranial hemorrhage. There are two small cortically based infarcts within the high right parietal lobe which are favored subacute to chronic. Consider brain MRI for confirmation, as clinically warranted. Mild generalized parenchymal atrophy. Electronically Signed   By: Kellie Simmering DO   On: 10/17/2019 13:45    Procedures Procedures (including critical care time) CRITICAL CARE Performed by: Valarie Merino   Total critical care time: 30 minutes  Critical care time was exclusive of  separately billable procedures and treating other patients.  Critical care was necessary to treat or prevent imminent or life-threatening deterioration.  Critical care was time spent personally by me on the following activities: development of treatment plan with patient and/or surrogate as well as nursing, discussions with consultants, evaluation of patient's response to treatment, examination of patient, obtaining history from patient or surrogate, ordering and performing treatments and interventions, ordering and review of laboratory studies, ordering and review of radiographic studies, pulse oximetry and re-evaluation of patient's condition.   Medications Ordered in ED Medications  LORazepam (ATIVAN) 2 MG/ML injection (has no administration in time range)  insulin regular, human (MYXREDLIN) 100 units/ 100 mL infusion (5 Units/hr Intravenous Rate/Dose Change 10/17/19 1557)  0.9 %  sodium chloride infusion (has no administration in time range)  dextrose 5 %-0.45 % sodium chloride infusion (has no administration in time range)  dextrose 50 % solution 0-50 mL (has no administration in time range)  sodium chloride flush (NS) 0.9 % injection 3 mL (3 mLs Intravenous Given 10/17/19 1349)  levETIRAcetam (KEPPRA) IVPB 1000 mg/100 mL premix (0 mg Intravenous Stopped 10/17/19 1343)    And  levETIRAcetam (KEPPRA) IVPB 1000 mg/100 mL premix (0 mg Intravenous Stopped 10/17/19 1342)  sodium chloride 0.9 % bolus 1,000 mL (0 mLs Intravenous Stopped 10/17/19 1416)  LORazepam (ATIVAN) injection 1 mg (1 mg Intravenous Given 10/17/19 1336)  levETIRAcetam (KEPPRA) IVPB 1000 mg/100 mL premix (0 mg Intravenous Stopped 10/17/19 1438)  iohexol (OMNIPAQUE) 350 MG/ML injection 100 mL (100 mLs Intravenous Contrast Given 10/17/19 1427)    ED Course  I have reviewed the triage vital signs and the nursing notes.  Pertinent labs & imaging results that were available during my care of the patient were reviewed by me and  considered in my medical decision making (see chart for details).    MDM Rules/Calculators/A&P                      MDM  Screen complete  Esli Jernigan was evaluated in Emergency Department on 10/17/2019 for the symptoms described in the history of present illness. He was evaluated in the context of the global COVID-19 pandemic, which necessitated consideration that the patient might be at risk for infection with the SARS-CoV-2 virus that causes COVID-19. Institutional protocols and algorithms that pertain to the evaluation of patients at risk for COVID-19 are in a state of rapid change based on information released by regulatory bodies including the CDC and federal and state organizations. These policies and algorithms were followed during the patient's care in the  ED.  Patient presented for evaluation of seizure and altered mental status.  Patient initially evaluated primarily by neuro.  Patient did not meet criteria for TPA or other acute intervention by neuro.  Patient's work-up did reveal significant hyperglycemia.  It is thought that his seizure and altered mental status could be secondary to this.  Keppra initiated.  Benzodiazepines given.  Insulin drip initiated.  Hospitalist service is aware case and will evaluate for admission.  Final Clinical Impression(s) / ED Diagnoses Final diagnoses:  Hyperglycemia  Slurred speech  Seizure [facility])    Rx / DC Orders ED Discharge Orders    None       Valarie Merino, MD 10/17/19 1605

## 2019-10-18 ENCOUNTER — Encounter (HOSPITAL_COMMUNITY): Payer: Self-pay | Admitting: Internal Medicine

## 2019-10-18 ENCOUNTER — Other Ambulatory Visit: Payer: Self-pay

## 2019-10-18 DIAGNOSIS — R569 Unspecified convulsions: Secondary | ICD-10-CM

## 2019-10-18 DIAGNOSIS — IMO0002 Reserved for concepts with insufficient information to code with codable children: Secondary | ICD-10-CM

## 2019-10-18 DIAGNOSIS — E10649 Type 1 diabetes mellitus with hypoglycemia without coma: Secondary | ICD-10-CM

## 2019-10-18 DIAGNOSIS — E1065 Type 1 diabetes mellitus with hyperglycemia: Secondary | ICD-10-CM

## 2019-10-18 LAB — GLUCOSE, CAPILLARY
Glucose-Capillary: 159 mg/dL — ABNORMAL HIGH (ref 70–99)
Glucose-Capillary: 164 mg/dL — ABNORMAL HIGH (ref 70–99)
Glucose-Capillary: 222 mg/dL — ABNORMAL HIGH (ref 70–99)
Glucose-Capillary: 232 mg/dL — ABNORMAL HIGH (ref 70–99)
Glucose-Capillary: 242 mg/dL — ABNORMAL HIGH (ref 70–99)
Glucose-Capillary: 296 mg/dL — ABNORMAL HIGH (ref 70–99)
Glucose-Capillary: 317 mg/dL — ABNORMAL HIGH (ref 70–99)
Glucose-Capillary: 414 mg/dL — ABNORMAL HIGH (ref 70–99)

## 2019-10-18 LAB — LIPID PANEL
Cholesterol: 181 mg/dL (ref 0–200)
HDL: 26 mg/dL — ABNORMAL LOW (ref >40)
LDL Cholesterol: 127 mg/dL — ABNORMAL HIGH (ref 0–99)
Total CHOL/HDL Ratio: 7 RATIO
Triglycerides: 142 mg/dL (ref <150)
VLDL: 28 mg/dL (ref 0–40)

## 2019-10-18 LAB — BASIC METABOLIC PANEL
Anion gap: 8 (ref 5–15)
BUN: 18 mg/dL (ref 6–20)
CO2: 25 mmol/L (ref 22–32)
Calcium: 8.5 mg/dL — ABNORMAL LOW (ref 8.9–10.3)
Chloride: 104 mmol/L (ref 98–111)
Creatinine, Ser: 1.07 mg/dL (ref 0.61–1.24)
GFR calc Af Amer: >60 mL/min (ref >60)
GFR calc non Af Amer: >60 mL/min (ref >60)
Glucose, Bld: 179 mg/dL — ABNORMAL HIGH (ref 70–99)
Potassium: 3.5 mmol/L (ref 3.5–5.1)
Sodium: 137 mmol/L (ref 135–145)

## 2019-10-18 LAB — HEMOGLOBIN A1C
Hgb A1c MFr Bld: 13.4 % — ABNORMAL HIGH (ref 4.8–5.6)
Mean Plasma Glucose: 337.88 mg/dL

## 2019-10-18 LAB — HIV ANTIBODY (ROUTINE TESTING W REFLEX): HIV Screen 4th Generation wRfx: NONREACTIVE

## 2019-10-18 MED ORDER — INSULIN ASPART 100 UNIT/ML ~~LOC~~ SOLN
0.0000 [IU] | SUBCUTANEOUS | Status: DC
  Administered 2019-10-18: 1 [IU] via SUBCUTANEOUS
  Administered 2019-10-18: 6 [IU] via SUBCUTANEOUS
  Administered 2019-10-18 (×2): 2 [IU] via SUBCUTANEOUS
  Administered 2019-10-18: 3 [IU] via SUBCUTANEOUS
  Administered 2019-10-19 (×3): 1 [IU] via SUBCUTANEOUS

## 2019-10-18 MED ORDER — LORAZEPAM 2 MG/ML IJ SOLN: 1.0000 mg | INTRAMUSCULAR | Status: DC | PRN

## 2019-10-18 MED ORDER — ATORVASTATIN CALCIUM 40 MG PO TABS
40.0000 mg | ORAL_TABLET | Freq: Every day | ORAL | Status: DC
  Administered 2019-10-18: 40 mg via ORAL
  Filled 2019-10-18: qty 1

## 2019-10-18 MED ORDER — DEXTROSE 50 % IV SOLN
25.0000 g | INTRAVENOUS | Status: AC
  Administered 2019-10-18: 25 g via INTRAVENOUS
  Filled 2019-10-18: qty 50

## 2019-10-18 MED ORDER — INSULIN GLARGINE 100 UNIT/ML ~~LOC~~ SOLN
20.0000 [IU] | Freq: Every day | SUBCUTANEOUS | Status: DC
  Administered 2019-10-18: 20 [IU] via SUBCUTANEOUS
  Filled 2019-10-18 (×2): qty 0.2

## 2019-10-18 MED ORDER — INSULIN GLARGINE 100 UNIT/ML ~~LOC~~ SOLN
5.0000 [IU] | Freq: Every day | SUBCUTANEOUS | Status: DC
  Administered 2019-10-18: 5 [IU] via SUBCUTANEOUS
  Filled 2019-10-18 (×2): qty 0.05

## 2019-10-18 MED ORDER — INSULIN GLARGINE 100 UNIT/ML ~~LOC~~ SOLN
15.0000 [IU] | Freq: Once | SUBCUTANEOUS | Status: AC
  Administered 2019-10-18: 15 [IU] via SUBCUTANEOUS
  Filled 2019-10-18: qty 0.15

## 2019-10-18 NOTE — Procedures (Addendum)
Patient Name: Seth Weeks  MRN: [id number]  Epilepsy Attending: Lora Havens  Referring Physician/Provider: Dr. Kathrynn Speed Duration: 10/17/2019 1507 to 10/18/2019 1044 Duration of study: 19.5 hours  Patient history: 59 year old male with history of diabetes (CBG more than 700) who presented after an episode of left-sided weakness and seizure-like activity.  EEG evaluate for seizures.  Level of alertness: awake, asleep  AEDs during EEG study: Keppra, Ativan  Technical aspects: This EEG study was done with scalp electrodes positioned according to the 10-20 International system of electrode placement. Electrical activity was acquired at a sampling rate of 500Hz  and reviewed with a high frequency filter of 70Hz  and a low frequency filter of 1Hz . EEG data were recorded continuously and digitally stored.   Description:  The posterior dominant rhythm consists of 8 Hz activity of moderate voltage (25-35 uV) seen predominantly in posterior head regions, symmetric and reactive to eye opening and eye closing.         Sleep was characterized by sleep spindles (12-14hz ), maximal frontocentral. Hyperventilation and photic stimulation were not performed.  IMPRESSION: This study is within normal limits. No seizures or epileptiform discharges were seen throughout the recording.  Seth Weeks [name]

## 2019-10-18 NOTE — Progress Notes (Addendum)
Inpatient Diabetes Program Recommendations  AACE/ADA: New Consensus Statement on Inpatient Glycemic Control   Target Ranges:  Prepandial:   less than 140 mg/dL      Peak postprandial:   less than 180 mg/dL (1-2 hours)      Critically ill patients:  140 - 180 mg/dL  Results for Trosper, Carlester "[name]" (MRN [id number]) as of 10/18/2019 09:05  Ref. Range 10/18/2019 00:43 10/18/2019 02:01 10/18/2019 04:06 10/18/2019 08:19  Glucose-Capillary Latest Ref Range: 70 - 99 mg/dL 159 (H) 242 (H) 164 (H) 232 (H)   Results for Faulds, Emiliano "[name]" (MRN [id number]) as of 10/18/2019 09:05  Ref. Range 10/17/2019 13:11 10/17/2019 15:20 10/17/2019 15:56 10/17/2019 17:06 10/17/2019 18:33 10/17/2019 19:33 10/17/2019 20:40 10/17/2019 21:44 10/17/2019 22:40 10/17/2019 23:41  Glucose-Capillary Latest Ref Range: 70 - 99 mg/dL >600 (HH) 459 (H) 383 (H) 234 (H) 166 (H) 130 (H) 156 (H) 188 (H) 154 (H) 173 (H)   Review of Glycemic Control  Diabetes history: DM2 Outpatient Diabetes medications: Toujeo 60-100 units daily Current orders for Inpatient glycemic control: Lantus 5 units QHS, Novolog 0-6 units Q4H  Inpatient Diabetes Program Recommendations:   Insulin - Basal: Please consider increasing Lantus to 13 units QHS (based on 85.1 kg x 0.15 units).  HgbA1C: A1C 13.4% on 10/18/19 indicating an average glucose of 338 mg/dl over the past 2-3 months.  NOTE: In reviewing chart, noted video visit note on 09/26/19 by Dr. Sheppard Coil which notes "Pt seems uninterested in getting DM2 under control - needs to fill out papers for Rx assistance which were provided to paitent a month ago, or there's little I can do to help him."  Patient has no insurance and cash price for [employer] over $320 so question if patient has been taking any insulin at all if he has not completed and been approved for patient assistance for Toujeo.  Patient may need to be changed to more affordable insulin such as Novolin (NPH, 70/30, and/or Regular) which are $25 per vial or $43  per box of 5 insulin pens at [facility]  Will plan to talk with patient today.  Addendum 10/18/19@13 :21-Spoke with patient about diabetes and home regimen for diabetes control. Patient reports being followed by PCP for diabetes management and currently taking Toujeo (no specific dose as he has been rationing insulin). Patient reports that he has very little Toujeo left at home. Patient states that he is out of work under [employer] and he is not able to afford to purchase medications or pay to see his PCP due to financial hardship.  Asked patient if he filled out and mailed medication assistance forms his PCP provided him with on 08/29/19 and patient states that he has not had time to fill them out yet as he has a lot going on.  Discussed A1C results (13.4% on 10/18/19 ) and explained that current A1C indicates an average glucose of 338 mg/dl over the past 2-3 months. Discussed glucose and A1C goals. Patient states that our goals and his goals for DM are different. Patient states that he experienced 2 severe lows in the past couple of years and he does not want his glucose to be too low. Asked patient when he began to feel his glucose was getting too low and patient reports that he is not sure he just knows he does not want his glucose to get low because it makes him feel so badly.  Discussed importance of checking CBGs and maintaining good CBG control to prevent long-term and short-term  complications. Explained how hyperglycemia leads to damage within blood vessels which lead to the common complications seen with uncontrolled diabetes. Stressed to the patient the importance of improving glycemic control to prevent further complications from uncontrolled diabetes. Discussed impact of nutrition, exercise, stress, sickness, and medications on diabetes control.  Explained that I understand his fear of hypoglycemia but it is important that he improve glycemic control to decrease risk of further complications from  uncontrolled DM.  Discussed that he needs more basal insulin than given at time of transition from IV insulin drip to get DM under control (up to 414 mg/dl at 12:05 today). Patient stated that insulin adjustments need to be made slowly with him to ensure he does not experience hypoglycemia. Acknowledged his concerns about hypoglycemia and explained that our goal is to improve glucose but also to prevent hypoglycemia as well.  Discussed more affordable insulins (such as Novolin NPH, 70/30, and/or Regular) which are $25 per vial or $43 per box of 5 insulin pens at [facility]. Patient states that the [place] works best for him and if his insulin is changed that it needs to be done safely to prevent hypoglycemia. Informed patient that TOC was consulted for medication assistance and asked if patient would like to establish care with a clinic for follow up since he is not able to afford to follow up with PCP consistently due to financial hardship. Patient states he is interested in being set up with a clinic especially "since I am out of work and having a hard time paying for anything." Encouraged patient to work with providers to improve his DM control. Patient verbalized understanding of information discussed and reports no further questions at this time related to diabetes.  Thanks, Barnie Alderman, RN, MSN, CDE Diabetes Coordinator Inpatient Diabetes Program [phone] (Team Pager from 8am to 5pm)

## 2019-10-18 NOTE — Progress Notes (Signed)
PROGRESS NOTE  Atlas Crossland WUJ:[id number] DOB: 1960/11/04 DOA: 10/17/2019 PCP: Emeterio Reeve, DO  Brief History    Quinnten Calvin is a 59 y.o. male with medical history significant of insulin-dependent diabetes mellitus, poorly controlled, hypertension, hyperlipidemia, generalized anxiety disorder, comes to the hospital with complaints of strokelike symptoms as well as a seizure episode.  Patient currently appears quite lethargic and confused, also appears to have slurred speech and communication is very difficult.  History obtained from discussion with the EDP as well as neurology.  He presented to the hospital with new onset seizures, apparently he was standing and suddenly lost consciousness and began having seizure-like activity per his family.  EMS was called and when they arrived they saw left-sided weakness.  On arrival to the hospital he had left hand clonic like activity which progressed and had another subsequent generalized seizure episode.  It aborted spontaneously.  Neurology consulted, he was seen as a code stroke, and upon my evaluation he is undergoing a stat EEG.  He is lethargic, wakes up a little bit, his speech is slurred and very difficult to understand but his only complaint is that he needs to use the urinal.  In the ED his vitals are stable, normotensive, satting well on room air.  Initial blood work revealed CBG of 818, creatinine 1.68, WBC 15.2.  SARS-CoV-2 was negative.  CT scan, CT angio of head and neck, CT cerebral perfusion without large vessel occlusion or hemodynamically significant stenosis, no evidence of core infarction or penumbra, but did show 2 small cortically based infarct with a left high right parietal lobe favored to be subacute to chronic.  We are asked to admit.  The patient was admitted to a telemetry bed. Neurology was consulted. They have recommended continuing Keppra at 1000 mg bid. They also recommended ASA 81 mg and atorvastatin 40 mg daily for  secondary stroke prevention. EEG has been performed which demonstrated no seizures or epileptiform discharges were seen throughout the recording.  The patient has poorly controlled glucoses at baseline. He says that his glucoses usually run around 300. They have been difficult to control as inpatient as well. Consultants  . neurology  Procedures  . EEG  Antibiotics   Anti-infectives (From admission, onward)   None    .  Subjective  The patient is resting comfortably. No new complaints.  Objective   Vitals:  Vitals:   10/18/19 1400 10/18/19 1616  BP:    Pulse: 97 96  Resp: 15 19  Temp:    SpO2:     Exam:  Constitutional:  . The patient is awake, alert, and oriented x 3. No acute distress. [name]  Respiratory:  . No increased work of breathing. . No wheezes, rales, or rhonchi . No tactile fremitus Cardiovascular:  . Regular rate and rhythm . No murmurs, ectopy, or gallups. . No lateral PMI. No thrills. Abdomen:  . Abdomen is soft, non-tender, non-distended . No hernias, masses, or organomegaly . Normoactive bowel sounds.  Musculoskeletal:  . No cyanosis, clubbing, or edema Skin:  . No rashes, lesions, ulcers . palpation of skin: no induration or nodules Neurologic:  . CN 2-12 intact . Sensation all 4 extremities intact Psychiatric:  . Mental status o Mood, affect appropriate o Orientation to person, place, time  . judgment and insight appear intact I have personally reviewed the following:   Today's Data  . Vitals, BMP, lipid panel, HbA1c  Other Data  . EEG negative  Scheduled Meds: . aspirin EC  81 mg Oral Daily  . atorvastatin  40 mg Oral q1800  . enoxaparin (LOVENOX) injection  40 mg Subcutaneous Q24H  . insulin aspart  0-6 Units Subcutaneous Q4H  . insulin glargine  20 Units Subcutaneous QHS  . pantoprazole  40 mg Oral Daily   Continuous Infusions: . sodium chloride 75 mL/hr at 10/18/19 1500  . levETIRAcetam 1,000 mg (10/18/19 1031)       LOS: 0 days    A & P   Problem  Seizure (Hcc)  DM (Diabetes Mellitus), Type 1, Uncontrolled (Hcc)  Cva (Cerebral Vascular Accident) (Hcc)  Hyperlipidemia  Hypertension Associated With Diabetes (Hcc)   Seizure/strokelike symptoms: Neurology consulted, appreciate input. Left hand clonic activity appears to have subsided.  EEG negative for seizure activity. Neurology has seen the patient and recommends continuation of keppra 1000 mg bid, initiation of ASA 81 mg daily and atrovastatin 40mg  daily. They recommend MRI brain and TTE which may be done as outpatient.   DKA: Resolved. Pt is now on [place] and SSI.    DM II - Uncontrolled. Most recent A1c in January 2021 was greater than 14. Lantus increased and SSI changed to resistant. Monitor. Most recent FSGS greater than 400.  Acute kidney injury: Likely in the setting of dehydration/DKA, monitor renal function with hydration  Essential hypertension: Hold home medications due to AKI and allow permissive hypertension until stroke is worked up  Elevated WBC: White count 15, no clear evidence of infection, likely reactive, monitor  GAD: Continue home medications when no longer n.p.o.  I have seen and examined this patient myself. I have spent 35 minutes in his evaluation and care.  DVT prophylaxis: Lovenox Code Status: Presumed full code Family Communication: No family present Disposition Plan: Likely home when improved   Nataliyah Packham, DO [facility] Direct contact: see [email or web address]  7PM-7AM contact night coverage as above 10/18/2019, 5:25 PM  LOS: 0 days

## 2019-10-18 NOTE — TOC Initial Note (Signed)
Transition of Care [facility]) - Initial/Assessment Note    Patient Details  Name: Seth Weeks MRN: [id number] Date of Birth: 02-Dec-1960  Transition of [facility]) CM/SW Contact:    Pollie Friar, RN Phone Number: 10/18/2019, 3:33 PM  Clinical Narrative:                 Pt states he lives in [place] and his PCP is in [place] but he has had issues affording going to PCP. CM inquired about one of the [facility] and would he have transportation. Pt would like to get into one of the clinics and states he would have transport. CM will get him a hospital f/u.  Pt also states he has trouble affording his troujeo. His PCP did give him the financial assistance form to fill out and mail in but pt has not done this yet. CM informed him this would be the most cost effective way for him to obtain the medication and he needed to get this sent in. Pt states he will work on it.  CM following for medications assist at d/c. If pt d/c tomorrow meds need to be sent to [facility]. If d/c over the weekend CM will provide MATCH with accepting pharmacies.    Expected Discharge Plan: Home/Self Care Barriers to Discharge: Continued Medical Work up, Inadequate or no insurance   Patient Goals and CMS Choice        Expected Discharge Plan and Services Expected Discharge Plan: Home/Self Care   Discharge Planning Services: CM Consult                                          Prior Living Arrangements/Services   Lives with:: Significant Other Patient language and need for interpreter reviewed:: Yes Do you feel safe going back to the place where you live?: Yes      Need for Family Participation in Patient Care: Yes (Comment)     Criminal Activity/Legal Involvement Pertinent to Current Situation/Hospitalization: No - Comment as needed  Activities of Daily Living Home Assistive Devices/Equipment: CBG Meter ADL Screening (condition at time of admission) Patient's cognitive ability adequate to  safely complete daily activities?: Yes Is the patient deaf or have difficulty hearing?: No Does the patient have difficulty seeing, even when wearing glasses/contacts?: No Does the patient have difficulty concentrating, remembering, or making decisions?: No Patient able to express need for assistance with ADLs?: Yes Does the patient have difficulty dressing or bathing?: No Independently performs ADLs?: No Communication: Independent Dressing (OT): Needs assistance Is this a change from baseline?: Change from baseline, expected to last <3days Grooming: Needs assistance Is this a change from baseline?: Change from baseline, expected to last <3 days Feeding: Independent Bathing: Needs assistance Is this a change from baseline?: Change from baseline, expected to last <3 days Toileting: Needs assistance Is this a change from baseline?: Change from baseline, expected to last <3 days In/Out Bed: Needs assistance Is this a change from baseline?: Change from baseline, expected to last <3 days Walks in Home: Independent Does the patient have difficulty walking or climbing stairs?: No Weakness of Legs: Both Weakness of Arms/Hands: Both  Permission Sought/Granted                  Emotional Assessment Appearance:: Appears stated age Attitude/Demeanor/Rapport: Engaged Affect (typically observed): Accepting Orientation: : Oriented to Self, Oriented to Place, Oriented to  Time, Oriented to Situation   Psych Involvement: No (comment)  Admission diagnosis:  Slurred speech [R47.81] Seizure ([place]) [R56.9] Hyperglycemia [R73.9] CVA (cerebral vascular accident) [facility]) [I63.9] Patient Active Problem List   Diagnosis Date Noted  . Seizure ([place]) 10/18/2019  . CVA (cerebral vascular accident) ([place]) 10/17/2019  . Acute respiratory failure with hypoxia ([place]) 07/26/2019  . Acute kidney failure ([place]) 07/24/2019  . Elevated troponin 07/24/2019  . Hyperlipidemia 03/16/2019  . Onychomycosis of  multiple toenails with type 2 diabetes mellitus ([place]) 11/30/2018  . Hypertension associated with diabetes ([place]) 11/24/2018  . Coronary artery disease involving native coronary artery of native heart without angina pectoris 04/14/2018  . Chronic systolic congestive heart failure ([place]) 04/11/2018  . Nonischemic cardiomyopathy ([place]) 04/11/2018  . Pericardial effusion 04/11/2018  . Tachycardia 04/11/2018  . MDD (major depressive disorder) 03/17/2018  . Metatarsalgia 03/17/2018  . Carpal tunnel syndrome 03/17/2018  . Peripheral neuropathy 06/16/2017  . Type 2 diabetes mellitus with diabetic neuropathy, unspecified ([place]) 06/16/2017  . Chronic pain 04/26/2017  . Bilateral tennis elbow 03/22/2017  . Impingement syndrome of right shoulder region 06/28/2016  . Skin lesion 03/03/2016  . GAD (generalized anxiety disorder) 07/17/2015  . Pes cavus 01/28/2015  . Umbilical hernia 37/29/0211  . Benign hypertension with coincident congestive heart failure ([place]) 10/19/2013  . BPV (benign positional vertigo) 07/23/2013  . Type 2 diabetes mellitus with peripheral neuropathy ([place]) 05/25/2013  . Erectile dysfunction 05/25/2013   PCP:  Emeterio Reeve, DO Pharmacy:   [facility] [address], [place] [address] Phone: [phone] Fax: [place], [place] [address] [address] Phone: [phone] Fax: [phone]  [id number] [place], [place] [address] Phone: [phone] Fax: [phone]     Social Determinants of Health (SDOH) Interventions    Readmission Risk Interventions No flowsheet data found.

## 2019-10-18 NOTE — Progress Notes (Signed)
LTM EEG discontinued - no skin breakdown at unhook.   

## 2019-10-18 NOTE — Progress Notes (Signed)
OT Cancellation Note  Patient Details Name: Seth Weeks MRN: [id number] DOB: 10/30/1960   Cancelled Treatment:    Reason Eval/Treat Not Completed: Patient at procedure or test/ unavailable. Pt on continuous EEG. Will await until this is over to do eval so we are not limited in mobility.  Tye [place] , OTR/L [place] Pager [phone] Office [phone]    10/18/2019, 9:21 AM

## 2019-10-18 NOTE — Progress Notes (Addendum)
Subjective: No further seizure-like activity overnight.  Patient states he is feeling much better than yesterday.  Denies any concerns at this point except bilateral foot pain secondary to his chronic diabetic neuropathy.  Also denies ever having seizures in the past.  States his blood sugar is usually about 300 and he has had some issues obtaining insulin in the recent past which may have led to his presentation yesterday   ROS: negative except above  Examination  Vital signs in last 24 hours: Temp:  [97.6 F (36.4 C)-98.6 F (37 C)] 97.7 F (36.5 C) (03/18 0810) Pulse Rate:  [45-102] 90 (03/18 0810) Resp:  [15-30] 16 (03/18 0810) BP: (120-158)/(77-94) 127/84 (03/18 0810) SpO2:  [95 %-100 %] 99 % (03/18 0810) Weight:  [85.1 kg-85.3 kg] 85.1 kg (03/17 2003)  General: lying in bed, not in apparent distress CVS: pulse-normal rate and rhythm RS: breathing comfortably, CTA B Extremities: normal, warm  Neuro: MS: Alert, oriented, follows commands CN: pupils equal and reactive, left eye does not cross midline to left at baseline since birth per patient, rest ocular movements intact, face symmetric, tongue midline, normal sensation over face, Motor: 5/5 strength in all 4 extremities Sensory: Intact to light touch in all 4 extremities  Basic Metabolic Panel: Recent Labs  Lab 10/17/19 1314 10/17/19 1316 10/17/19 2215 10/18/19 0429  NA 125* 127* 137 137  K 4.8 4.8 3.7 3.5  CL 90* 92* 102 104  CO2 17*  --  24 25  GLUCOSE 818* >700* 188* 179*  BUN 33* 33* 21* 18  CREATININE 1.68* 1.40* 1.00 1.07  CALCIUM 9.0  --  8.5* 8.5*  MG  --   --  2.1  --   PHOS  --   --  3.8  --     CBC: Recent Labs  Lab 10/17/19 1314 10/17/19 1316  WBC 15.2*  --   NEUTROABS 13.6*  --   HGB 14.3 15.0  HCT 43.2 44.0  MCV 83.7  --   PLT 202  --      Coagulation Studies: Recent Labs    10/17/19 1314  LABPROT 13.3  INR 1.0    Imaging CT head without contrast 10/17/2019: No evidence of  acute intracranial hemorrhage.  There are two small cortically based infarcts within the high right parietal lobe which are favored subacute to chronic. Consider brain MRI for confirmation, as clinically warranted. Mild generalized parenchymal atrophy.      CT head and neck 10/17/2019: No large vessel occlusion  ASSESSMENT AND PLAN: 59 year old male with poorly controlled diabetes who presented with DKA and most likely provoked seizure.    DKA Suspected provoked seizure in setting of hypoglycemia Acute metabolic encephalopathy (resolved) Left thyroid nodule Subacute left parietal stroke Hyperlipidemia -Patient presented with seizure yesterday which is likely provoked in the setting of DKA. -LTM EEG did not show any epileptiform activity -CT head showed small cortically based infarcts in high right parietal lobe favored to be subacute to chronic.  Recommendations -We will discontinue LTM as no further seizures overnight and patient appears to be back to baseline -Even though this was likely a provoked seizure in setting of DKA, patient has cortical lesion which increases his risk for future seizures.  Therefore recommend continuing Keppra 1000 mg twice daily -Also recommend MRI brain without contrast and TTE which can be performed as an outpatient -Recommend starting aspirin 81 mg daily and atorvastatin 40 mg nightly for secondary stroke prevention -Recommend follow-up of thyroid nodule by PCP -  Recommend follow-up with neurology in 8 to 12 weeks for further stroke work-up as well as management of seizures. -Seizure precautions including do not drive.   Thank you for allowing [place] to participate in the care of this patient.  Neurology will sign off.  Please page neuro hospitalist for any further questions after 5 PM.  I have spent a total of  35 minutes with the patient reviewing hospital notes,  test results, labs and examining the patient as well as establishing an assessment and plan  that was discussed personally with the patient and Dr Benny Lennert.  > 50% of time was spent in direct patient care.   Tahir Blank [name]

## 2019-10-18 NOTE — Evaluation (Signed)
Physical Therapy Evaluation Patient Details Name: Seth Weeks MRN: [id number] DOB: [birth year] Today's Date: 10/18/2019   History of Present Illness  59 y.o. male with medical history significant of insulin-dependent diabetes mellitus, poorly controlled, hypertension, hyperlipidemia, generalized anxiety disorder, comes to the hospital with complaints of strokelike symptoms as well as a seizure episode. Pt presents with lethargy, confusion, and slurred speech, apparently he was standing and suddenly lost consciousness and began having seizure-like activity per his family.  Clinical Impression  Pt presents to PT with deficits in functional mobility, gait, balance, power, strength. Pt cites general fatigue durign session and mobility is limited by continuous EEG lines at this time. Pt is able to perform bed mobility, transfer, and march at edge of bed without physical assistance at this time. Pt will benefit form further gait assessment once off of EEG to assess further DME and PT needs, however PT anticipates the patient will mobilize well and be safe for discharge home when medically ready.    Follow Up Recommendations No PT follow up;Supervision - Intermittent    Equipment Recommendations  (TBD with further gait assessment)    Recommendations for Other Services       Precautions / Restrictions Precautions Precautions: Fall;Other (comment) Precaution Comments: seizure, continuous EEG Restrictions Weight Bearing Restrictions: No      Mobility  Bed Mobility Overal bed mobility: Needs Assistance Bed Mobility: Supine to Sit;Sit to Supine     Supine to sit: Supervision;HOB elevated Sit to supine: Supervision      Transfers Overall transfer level: Needs assistance Equipment used: None Transfers: Sit to/from Stand Sit to Stand: Supervision            Ambulation/Gait Ambulation/Gait assistance: Supervision Gait Distance (Feet): 0 Feet(8 steps in place at edge of  bed) Assistive device: None Gait Pattern/deviations: Step-to pattern Gait velocity: 0 Gait velocity interpretation: <1.31 ft/sec, indicative of household ambulator General Gait Details: pt marching in place at edge of bed due to EEG  Stairs            Wheelchair Mobility    Modified Rankin (Stroke Patients Only)       Balance Overall balance assessment: Needs assistance Sitting-balance support: No upper extremity supported;Feet supported Sitting balance-Leahy Scale: Good Sitting balance - Comments: supervision   Standing balance support: No upper extremity supported Standing balance-Leahy Scale: Good Standing balance comment: supervision                             Pertinent Vitals/Pain Pain Assessment: Faces Faces Pain Scale: Hurts little more Pain Location: generalized Pain Descriptors / Indicators: Sore Pain Intervention(s): Limited activity within patient's tolerance    Home Living Family/patient expects to be discharged to:: Private residence Living Arrangements: Alone Available Help at Discharge: Family;Friend(s);Available PRN/intermittently Type of Home: House Home Access: Stairs to enter Entrance Stairs-Rails: Right Entrance Stairs-Number of Steps: 2 Home Layout: One level Home Equipment: None(may have DME available if needed, unable to say what DME)      Prior Function Level of Independence: Independent               Hand Dominance   Dominant Hand: Right    Extremity/Trunk Assessment   Upper Extremity Assessment Upper Extremity Assessment: Generalized weakness(grossly 4/5)    Lower Extremity Assessment Lower Extremity Assessment: Overall WFL for tasks assessed    Cervical / Trunk Assessment Cervical / Trunk Assessment: Normal  Communication   Communication: No difficulties  Cognition Arousal/Alertness:  Awake/alert Behavior During Therapy: WFL for tasks assessed/performed Overall Cognitive Status: Within Functional  Limits for tasks assessed                                        General Comments General comments (skin integrity, edema, etc.): VSS on RA, pt on EEG. PT does note midline gaze deviation of R eye, pt denies visual disturbance, reports vision is at baseline    Exercises     Assessment/Plan    PT Assessment Patient needs continued PT services  PT Problem List Decreased strength;Decreased activity tolerance;Decreased balance;Decreased mobility;Decreased knowledge of use of DME       PT Treatment Interventions DME instruction;Gait training;Stair training;Functional mobility training;Therapeutic activities;Therapeutic exercise;Balance training;Neuromuscular re-education;Patient/family education    PT Goals (Current goals can be found in the Care Plan section)  Acute Rehab PT Goals Patient Stated Goal: To return to baseline PT Goal Formulation: With patient Time For Goal Achievement: 11/01/19 Potential to Achieve Goals: Good Additional Goals Additional Goal #1: Pt will maintain synamic standing balance within 10 inches of his base of support independently without UE support    Frequency Min 3X/week   Barriers to discharge        Co-evaluation               AM-PAC PT "6 Clicks" Mobility  Outcome Measure Help needed turning from your back to your side while in a flat bed without using bedrails?: None Help needed moving from lying on your back to sitting on the side of a flat bed without using bedrails?: None Help needed moving to and from a bed to a chair (including a wheelchair)?: None Help needed standing up from a chair using your arms (e.g., wheelchair or bedside chair)?: None Help needed to walk in hospital room?: A Little Help needed climbing 3-5 steps with a railing? : A Little 6 Click Score: 22    End of Session   Activity Tolerance: Patient tolerated treatment well Patient left: in bed;with call bell/phone within reach;with bed alarm set Nurse  Communication: Mobility status PT Visit Diagnosis: Unsteadiness on feet (R26.81);Other abnormalities of gait and mobility (R26.89);Other symptoms and signs involving the nervous system (U13.244)    Time: 0102-7253 PT Time Calculation (min) (ACUTE ONLY): 20 min   Charges:   PT Evaluation $PT Eval Moderate Complexity: 1 Mod          Zenaida Niece, PT, DPT Acute Rehabilitation Pager: [phone]   Zenaida Niece 10/18/2019, 8:45 AM

## 2019-10-18 NOTE — Progress Notes (Signed)
Pt CBG 414. MD notified. MD gave verbal order to give 14 units Novolog one time. 14 units Novolog administered subq. Will recheck CBG.

## 2019-10-19 DIAGNOSIS — I10 Essential (primary) hypertension: Secondary | ICD-10-CM

## 2019-10-19 DIAGNOSIS — E1159 Type 2 diabetes mellitus with other circulatory complications: Secondary | ICD-10-CM

## 2019-10-19 DIAGNOSIS — E782 Mixed hyperlipidemia: Secondary | ICD-10-CM

## 2019-10-19 LAB — BASIC METABOLIC PANEL
Anion gap: 10 (ref 5–15)
BUN: 15 mg/dL (ref 6–20)
CO2: 23 mmol/L (ref 22–32)
Calcium: 8.5 mg/dL — ABNORMAL LOW (ref 8.9–10.3)
Chloride: 103 mmol/L (ref 98–111)
Creatinine, Ser: 1.03 mg/dL (ref 0.61–1.24)
GFR calc Af Amer: >60 mL/min (ref >60)
GFR calc non Af Amer: >60 mL/min (ref >60)
Glucose, Bld: 192 mg/dL — ABNORMAL HIGH (ref 70–99)
Potassium: 3.7 mmol/L (ref 3.5–5.1)
Sodium: 136 mmol/L (ref 135–145)

## 2019-10-19 LAB — GLUCOSE, CAPILLARY
Glucose-Capillary: 140 mg/dL — ABNORMAL HIGH (ref 70–99)
Glucose-Capillary: 162 mg/dL — ABNORMAL HIGH (ref 70–99)
Glucose-Capillary: 195 mg/dL — ABNORMAL HIGH (ref 70–99)
Glucose-Capillary: 199 mg/dL — ABNORMAL HIGH (ref 70–99)

## 2019-10-19 MED ORDER — PEN NEEDLES 31G X 8 MM MISC: 1 refills | Status: AC

## 2019-10-19 MED ORDER — LEVETIRACETAM 1000 MG PO TABS: 1000.0000 mg | ORAL_TABLET | Freq: Two times a day (BID) | ORAL | 0 refills | Status: DC

## 2019-10-19 MED ORDER — TOUJEO MAX SOLOSTAR 300 UNIT/ML ~~LOC~~ SOPN: 40.0000 [IU] | PEN_INJECTOR | Freq: Every day | SUBCUTANEOUS | 11 refills | Status: DC

## 2019-10-19 MED ORDER — ATORVASTATIN CALCIUM 40 MG PO TABS: 40.0000 mg | ORAL_TABLET | Freq: Every day | ORAL | 0 refills | Status: DC

## 2019-10-19 MED ORDER — ONDANSETRON HCL 8 MG PO TABS: 4.0000 mg | ORAL_TABLET | Freq: Three times a day (TID) | ORAL | 0 refills | Status: DC | PRN

## 2019-10-19 MED ORDER — INSULIN GLARGINE 100 UNIT/ML ~~LOC~~ SOLN: 40.0000 [IU] | Freq: Every day | SUBCUTANEOUS | 11 refills | Status: DC

## 2019-10-19 MED FILL — ATORVASTATIN CALCIUM 40 MG: 40 | 30 days supply | Qty: 30 | Fill #0

## 2019-10-19 MED FILL — levETIRAcetam 500 MG TABS: 500 | 30 days supply | Qty: 120 | Fill #0

## 2019-10-19 MED FILL — ONDANSETRON HCL 8 MG TABLET: 8 | 10 days supply | Qty: 30 | Fill #0

## 2019-10-19 MED FILL — LANTUS SOLOSTAR 100 UNITS/M: 100 | 30 days supply | Qty: 12 | Fill #0

## 2019-10-19 MED FILL — PENTIPS 31G X 8 MM MISC: 31G X 8 MM | 30 days supply | Qty: 100 | Fill #0

## 2019-10-19 NOTE — Plan of Care (Signed)
  Problem: Coping: Goal: Ability to identify appropriate support needs will improve 10/19/2019 1607 by Myriam Forehand, RN Outcome: Adequate for Discharge 10/19/2019 1606 by Myriam Forehand, RN Outcome: Adequate for Discharge 10/19/2019 1606 by Myriam Forehand, RN Outcome: Adequate for Discharge   Problem: Health Behavior/Discharge Planning: Goal: Compliance with prescribed medication regimen will improve 10/19/2019 1607 by Myriam Forehand, RN Outcome: Adequate for Discharge 10/19/2019 1606 by Myriam Forehand, RN Outcome: Adequate for Discharge 10/19/2019 1606 by Myriam Forehand, RN Outcome: Adequate for Discharge   Problem: Medication: Goal: Risk for medication side effects will decrease 10/19/2019 1607 by Myriam Forehand, RN Outcome: Adequate for Discharge 10/19/2019 1606 by Myriam Forehand, RN Outcome: Adequate for Discharge 10/19/2019 1606 by Myriam Forehand, RN Outcome: Adequate for Discharge

## 2019-10-19 NOTE — Plan of Care (Signed)
  Problem: Coping: Goal: Ability to identify appropriate support needs will improve 10/19/2019 1606 by Myriam Forehand, RN Outcome: Adequate for Discharge 10/19/2019 1606 by Myriam Forehand, RN Outcome: Adequate for Discharge   Problem: Health Behavior/Discharge Planning: Goal: Compliance with prescribed medication regimen will improve 10/19/2019 1606 by Myriam Forehand, RN Outcome: Adequate for Discharge 10/19/2019 1606 by Myriam Forehand, RN Outcome: Adequate for Discharge   Problem: Medication: Goal: Risk for medication side effects will decrease 10/19/2019 1606 by Myriam Forehand, RN Outcome: Adequate for Discharge 10/19/2019 1606 by Myriam Forehand, RN Outcome: Adequate for Discharge

## 2019-10-19 NOTE — Plan of Care (Signed)
  Problem: Coping: Goal: Ability to identify appropriate support needs will improve 10/19/2019 1607 by Myriam Forehand, RN Outcome: Adequate for Discharge 10/19/2019 1607 by Myriam Forehand, RN Outcome: Adequate for Discharge 10/19/2019 1606 by Myriam Forehand, RN Outcome: Adequate for Discharge 10/19/2019 1606 by Myriam Forehand, RN Outcome: Adequate for Discharge   Problem: Health Behavior/Discharge Planning: Goal: Compliance with prescribed medication regimen will improve 10/19/2019 1607 by Myriam Forehand, RN Outcome: Adequate for Discharge 10/19/2019 1607 by Myriam Forehand, RN Outcome: Adequate for Discharge 10/19/2019 1606 by Myriam Forehand, RN Outcome: Adequate for Discharge 10/19/2019 1606 by Myriam Forehand, RN Outcome: Adequate for Discharge   Problem: Medication: Goal: Risk for medication side effects will decrease 10/19/2019 1607 by Myriam Forehand, RN Outcome: Adequate for Discharge 10/19/2019 1607 by Myriam Forehand, RN Outcome: Adequate for Discharge 10/19/2019 1606 by Myriam Forehand, RN Outcome: Adequate for Discharge 10/19/2019 1606 by Myriam Forehand, RN Outcome: Adequate for Discharge

## 2019-10-19 NOTE — Progress Notes (Signed)
SLP Cancellation Note  Patient Details Name: Eliah Marquard MRN: [id number] DOB: May 26, 1961   Cancelled treatment:       Reason Eval/Treat Not Completed: SLP screened, no needs identified, will sign off   Jhase Creppel, Katherene Ponto 10/19/2019, 11:48 AM

## 2019-10-19 NOTE — Evaluation (Signed)
Occupational Therapy Evaluation Patient Details Name: Seth Weeks MRN: [id number] DOB: 1960-08-06 Today's Date: 10/19/2019    History of Present Illness 59 y.o. male with medical history significant of insulin-dependent diabetes mellitus, poorly controlled, hypertension, hyperlipidemia, generalized anxiety disorder, comes to the hospital with complaints of strokelike symptoms as well as a seizure episode. Pt presents with lethargy, confusion, and slurred speech, apparently he was standing and suddenly lost consciousness and began having seizure-like activity per his family.   Clinical Impression   Patient evaluated by Occupational Therapy with no further acute OT needs identified. All education has been completed and the patient has no further questions. See below for any follow-up Occupational Therapy or equipment needs. OT to sign off. Thank you for referral.   Pt did demonstrate trouble locating room on return from bathroom transfer. Pt recognized error and able to adjust to locate room. Pt becoming more anxious with this error initially.     Follow Up Recommendations  No OT follow up    Equipment Recommendations  None recommended by OT    Recommendations for Other Services       Precautions / Restrictions Precautions Precautions: Fall Precaution Comments: seizure      Mobility Bed Mobility Overal bed mobility: Modified Independent                Transfers Overall transfer level: Modified independent                    Balance                               High Level Balance Comments: pt was able to pick object off the floor by using counter of the sink to reach item           ADL either performed or assessed with clinical judgement   ADL Overall ADL's : Modified independent                                             Vision Patient Visual Report: No change from baseline Additional Comments: pt noted to have  internal rotation of L eye     Perception     Praxis      Pertinent Vitals/Pain Pain Assessment: No/denies pain     Hand Dominance Right   Extremity/Trunk Assessment Upper Extremity Assessment Upper Extremity Assessment: Overall WFL for tasks assessed   Lower Extremity Assessment Lower Extremity Assessment: Defer to PT evaluation   Cervical / Trunk Assessment Cervical / Trunk Assessment: Normal   Communication Communication Communication: No difficulties   Cognition Arousal/Alertness: Awake/alert Behavior During Therapy: WFL for tasks assessed/performed Overall Cognitive Status: Within Functional Limits for tasks assessed                                     General Comments       Exercises     Shoulder Instructions      Home Living Family/patient expects to be discharged to:: Private residence Living Arrangements: Alone Available Help at Discharge: Family;Friend(s);Available PRN/intermittently Type of Home: House Home Access: Stairs to enter [employer] of Steps: 2 Entrance Stairs-Rails: Right Home Layout: One level     Bathroom Shower/Tub: Teacher, early years/pre:  Standard     Home Equipment: None   Additional Comments: Tammy friend that he could call for (A) if needed.       Prior Functioning/Environment Level of Independence: Independent                 OT Problem List: Decreased strength;Decreased activity tolerance;Impaired balance (sitting and/or standing)      OT Treatment/Interventions:      OT Goals(Current goals can be found in the care plan section) Acute Rehab OT Goals Patient Stated Goal: to leave today - i am learning i like hospitals less and less OT Goal Formulation: With patient Potential to Achieve Goals: Good  OT Frequency:     Barriers to D/C:            Co-evaluation              AM-PAC OT "6 Clicks" Daily Activity     Outcome Measure Help from another person eating  meals?: None Help from another person taking care of personal grooming?: None Help from another person toileting, which includes using toliet, bedpan, or urinal?: None Help from another person bathing (including washing, rinsing, drying)?: None Help from another person to put on and taking off regular upper body clothing?: None Help from another person to put on and taking off regular lower body clothing?: None 6 Click Score: 24   End of Session Equipment Utilized During Treatment: Gait belt Nurse Communication: Mobility status;Precautions  Activity Tolerance: Patient tolerated treatment well Patient left: in chair;with call bell/phone within reach;with chair alarm set(MD in room )  OT Visit Diagnosis: Unsteadiness on feet (R26.81)                Time: 1030-1055 OT Time Calculation (min): 25 min Charges:  OT General Charges $OT Visit: 1 Visit OT Evaluation $OT Eval Moderate Complexity: 1 Mod   Brynn, OTR/L  Acute Rehabilitation Services Pager: [phone] Office: [phone] .   Jeri Modena 10/19/2019, 11:12 AM

## 2019-10-19 NOTE — Progress Notes (Signed)
Inpatient Diabetes Program Recommendations  AACE/ADA: New Consensus Statement on Inpatient Glycemic Control (2015)  Target Ranges:  Prepandial:   less than 140 mg/dL      Peak postprandial:   less than 180 mg/dL (1-2 hours)      Critically ill patients:  140 - 180 mg/dL   Lab Results  Component Value Date   GLUCAP 140 (H) 10/19/2019   HGBA1C 13.4 (H) 10/18/2019    Review of Glycemic Control  Diabetes history: DM 2 Outpatient Diabetes medications: Toujeo 60-100 units Daily Current orders for Inpatient glycemic control:  Lantus 20 units Novolog "very sensitive scale" 0-6 units q4 hours  Inpatient Diabetes Program Recommendations:  Pt received Lantus 15 units at 1406 pm, Lantus 20 units at 2140 pm. Fasting glucose 140 this am.  -  Consider increasing Lantus to 35 units.  -  Increase Novolog Correction to 0-9 units tid  Thanks,  Tama Headings RN, MSN, BC-ADM Inpatient Diabetes Coordinator Team Pager [phone] (8a-5p)

## 2019-10-19 NOTE — Progress Notes (Addendum)
PT Cancellation Note  Patient Details Name: Seth Weeks MRN: [id number] DOB: [birth year]   Cancelled Treatment:    Reason Eval/Treat Not Completed: Other (comment) Pt was seen by OT earlier this am and able to ambulate in hall.  (PT saw pt ambulating with OT >100' gait appeared normal during that time).  Pt declined PT at this time.  States he feels like his symptoms have resolved and near baseline, just reports sore.  States "I'm leaving today with or without permission, prefer with permission."  Discussed will keep on PT list just in case he is still in hospital. Family was present.  Maggie Font, PT Acute Rehab Services Pager [phone] [facility] [phone]   Karlton Lemon 10/19/2019, 1:16 PM

## 2019-10-19 NOTE — Plan of Care (Signed)
  Problem: Coping: Goal: Ability to identify appropriate support needs will improve Outcome: Adequate for Discharge   Problem: Health Behavior/Discharge Planning: Goal: Compliance with prescribed medication regimen will improve Outcome: Adequate for Discharge   Problem: Medication: Goal: Risk for medication side effects will decrease Outcome: Adequate for Discharge

## 2019-10-20 NOTE — Discharge Summary (Signed)
Physician Discharge Summary  Seth Weeks FXJ:[id number] DOB: 15-Jul-1961 DOA: 10/17/2019  PCP: Emeterio Reeve, DO  Admit date: 10/17/2019 Discharge date: 10/20/2019  Recommendations for Outpatient Follow-up:  1. Discharge to home 2. Check blood sugars twice daily. Take record of blood sugars in to PCP visit. 3. Follow up with PCP in 7-10 days. 4. Follow up with neurology as directed.  Discharge Diagnoses: Principal diagnosis is #1 1. Seizure/stroke-like symptoms 2. DKA 3. DM II - Poor control 4. AKI 5. Essential hypertension 6. Reactive leukocytosis 7. Generalized Anxiety Disorder  Discharge Condition: Fair  Disposition: Home  Diet recommendation: Heart healthy/Carbohydrate modified  Filed Weights   10/17/19 1300 10/17/19 2003  Weight: 85.3 kg 85.1 kg   History of present illness:  Seth Weeks is a 59 y.o. male with medical history significant of insulin-dependent diabetes mellitus, poorly controlled, hypertension, hyperlipidemia, generalized anxiety disorder, comes to the hospital with complaints of strokelike symptoms as well as a seizure episode.  Patient currently appears quite lethargic and confused, also appears to have slurred speech and communication is very difficult.  History obtained from discussion with the EDP as well as neurology.  He presented to the hospital with new onset seizures, apparently he was standing and suddenly lost consciousness and began having seizure-like activity per his family.  EMS was called and when they arrived they saw left-sided weakness.  On arrival to the hospital he had left hand clonic like activity which progressed and had another subsequent generalized seizure episode.  It aborted spontaneously.  Neurology consulted, he was seen as a code stroke, and upon my evaluation he is undergoing a stat EEG.  He is lethargic, wakes up a little bit, his speech is slurred and very difficult to understand but his only complaint is that he needs to use  the urinal.  In the ED his vitals are stable, normotensive, satting well on room air.  Initial blood work revealed CBG of 818, creatinine 1.68, WBC 15.2.  SARS-CoV-2 was negative.  CT scan, CT angio of head and neck, CT cerebral perfusion without large vessel occlusion or hemodynamically significant stenosis, no evidence of core infarction or penumbra, but did show 2 small cortically based infarct with a left high right parietal lobe favored to be subacute to chronic.  We are asked to admit.  Hospital Course:  The patient was admitted to a telemetry bed. Neurology was consulted. They have recommended continuing Keppra at 1000 mg bid. They also recommended ASA 81 mg and atorvastatin 40 mg daily for secondary stroke prevention. EEG has been performed which demonstrated no seizures or epileptiform discharges were seen throughout the recording.  The patient has poorly controlled glucoses at baseline. He says that his glucoses usually run around 300. They have been difficult to control as inpatient as well.  Today's assessment: S: The patient is resting comfortably. He is anxious to discharge. O: Vitals:  Vitals:   10/19/19 1231 10/19/19 1613  BP: (!) 137/104 (!) 141/105  Pulse: 99 90  Resp: 18 16  Temp: 98 F (36.7 C) 97.7 F (36.5 C)  SpO2: 100% 100%    Exam:  Constitutional:   The patient is awake, alert, and oriented x 3. No acute distress.   Respiratory:   No increased work of breathing.  No wheezes, rales, or rhonchi  No tactile fremitus Cardiovascular:   Regular rate and rhythm  No murmurs, ectopy, or gallups.  No lateral PMI. No thrills. Abdomen:   Abdomen is soft, non-tender, non-distended  No hernias,  masses, or organomegaly  Normoactive bowel sounds.  Musculoskeletal:   No cyanosis, clubbing, or edema Skin:   No rashes, lesions, ulcers  palpation of skin: no induration or nodules Neurologic:   CN 2-12 intact  Sensation all 4 extremities  intact Psychiatric:   Mental status ? Mood, affect appropriate ? Orientation to person, place, time   judgment and insight appear intact Discharge Instructions  Discharge Instructions    Call MD for:   Complete by: As directed    Neurological changes   Call MD for:  extreme fatigue   Complete by: As directed    Call MD for:  persistant dizziness or light-headedness   Complete by: As directed    Diet - low sodium heart healthy   Complete by: As directed    Diet Carb Modified   Complete by: As directed    Discharge instructions   Complete by: As directed    Discharge to home Check blood sugars twice daily. Take record of blood sugars in to PCP visit. Follow up with PCP in 7-10 days. Follow up with neurology as directed.   Increase activity slowly   Complete by: As directed      Allergies as of 10/19/2019      Reactions   Bee Venom Anaphylaxis   Clindamycin/lincomycin Diarrhea   Bad diarrhea. Please do not give.    Pregabalin Other (See Comments)   expensive   Penicillins    Terramycin [oxytetracycline]       Medication List    STOP taking these medications   Narcan 4 MG/0.1ML Liqd nasal spray kit Generic drug: naloxone   promethazine 25 MG tablet Commonly known as: PHENERGAN   Toujeo Max SoloStar 300 UNIT/ML Solostar Pen Generic drug: insulin glargine (2 Unit Dial) Replaced by: insulin glargine 100 UNIT/ML injection     TAKE these medications   alprazolam 2 MG tablet Commonly known as: XANAX Take 1 tablet (2 mg total) by mouth 3 (three) times daily as needed. for anxiety. #90 for 30 days   AMBULATORY NON FORMULARY MEDICATION Single glucometer of choice with lancets, test strips. Test 3x daily.  E11.65 Disp QS 1 months   aspirin EC 81 MG tablet Take 81 mg by mouth daily.   atorvastatin 40 MG tablet Commonly known as: LIPITOR Take 1 tablet (40 mg total) by mouth daily at 6 PM.   buPROPion 150 MG 24 hr tablet Commonly known as: WELLBUTRIN XL Take  1 tablet by mouth once daily in the morning What changed:   how much to take  how to take this  when to take this  additional instructions   chlorhexidine 0.12 % solution Commonly known as: PERIDEX Use as directed 10 mLs in the mouth or throat 4 (four) times daily.   FreeStyle Libre 14 Day Sensor Misc USE AS DIRECTED CHANGE  EVERY  14  DAYS   furosemide 40 MG tablet Commonly known as: LASIX Take 1 tablet (40 mg total) by mouth daily.   insulin glargine 100 UNIT/ML injection Commonly known as: LANTUS Inject 0.4 mLs (40 Units total) into the skin at bedtime. Replaces: Toujeo Max SoloStar 300 UNIT/ML Solostar Pen   levETIRAcetam 1000 MG tablet Commonly known as: Keppra Take 1 tablet (1,000 mg total) by mouth 2 (two) times daily.   levocetirizine 5 MG tablet Commonly known as: Xyzal Take 1 tablet (5 mg total) by mouth every evening.   loperamide 2 MG tablet Commonly known as: IMODIUM A-D Take 1 tablet (2 mg  total) by mouth 4 (four) times daily as needed for diarrhea or loose stools.   losartan 25 MG tablet Commonly known as: COZAAR Take by mouth.   metoprolol succinate 50 MG 24 hr tablet Commonly known as: TOPROL-XL Take 50 mg by mouth daily.   Multi-Vitamin tablet Take 1 tablet by mouth daily.   nitroGLYCERIN 0.4 MG SL tablet Commonly known as: NITROSTAT PLACE 1 TABLET (0.4 MG TOTAL) UNDER THE TONGUE EVERY 5 (FIVE) MINUTES AS NEEDED FOR CHEST PAIN.   omeprazole 40 MG capsule Commonly known as: PRILOSEC Take 1 capsule (40 mg total) by mouth daily.   ondansetron 8 MG tablet Commonly known as: ZOFRAN Take 0.5-1 tablets (4-8 mg total) by mouth every 8 (eight) hours as needed for nausea or vomiting.   Pen Needles 31G X 8 MM Misc Use with lantus pen What changed:   medication strength  how much to take  how to take this  when to take this  additional instructions      Allergies  Allergen Reactions  . Bee Venom Anaphylaxis  .  Clindamycin/Lincomycin Diarrhea    Bad diarrhea. Please do not give.   . Pregabalin Other (See Comments)    expensive    . Penicillins   . Terramycin [Oxytetracycline]     The results of significant diagnostics from this hospitalization (including imaging, microbiology, ancillary and laboratory) are listed below for reference.    Significant Diagnostic Studies: CT Code Stroke CTA Head W/WO contrast  Result Date: 10/17/2019 CLINICAL DATA:  Code stroke follow-up EXAM: CT ANGIOGRAPHY HEAD AND NECK CT PERFUSION BRAIN TECHNIQUE: Multidetector CT imaging of the head and neck was performed using the standard protocol during bolus administration of intravenous contrast. Multiplanar CT image reconstructions and MIPs were obtained to evaluate the vascular anatomy. Carotid stenosis measurements (when applicable) are obtained utilizing NASCET criteria, using the distal internal carotid diameter as the denominator. Multiphase CT imaging of the brain was performed following IV bolus contrast injection. Subsequent parametric perfusion maps were calculated using RAPID software. CONTRAST:  183m OMNIPAQUE IOHEXOL 350 MG/ML SOLN COMPARISON:  None. FINDINGS: Suboptimal contrast bolus timing with significant venous enhancement. CTA NECK FINDINGS Aortic arch: Great vessel origins are patent. Right carotid system: Patent. Atherosclerotic wall thickening along the common carotid. Mild calcified and noncalcified plaque at the ICA origin causing minimal stenosis. Left carotid system: Patent. Atherosclerotic wall thickening along the common carotid. There is calcified and noncalcified plaque at the proximal ICA causing minimal stenosis. Vertebral arteries: Patent.  Left vertebral artery is dominant. Skeleton: Degenerative changes of the cervical spine. Other neck: There is a 1 cm nodule of the left thyroid lobe. No followup recommended (ref: J Am Coll Radiol. 2015 Feb;12(2): 143-50). Upper chest: No apical lung mass. Review of  the MIP images confirms the above findings CTA HEAD FINDINGS Anterior circulation: Intracranial internal carotid arteries are patent with minor calcified plaque. Anterior and middle cerebral arteries are patent. There is likely atherosclerotic irregularity superimposed on artifact. Posterior circulation: Intracranial left vertebral artery is patent with calcified plaque causing mild stenosis. Right vertebral artery terminates as a PICA. Basilar artery is patent. Posterior cerebral arteries are patent. There is likely atherosclerotic irregularity superimposed artifact. Venous sinuses: As permitted by contrast timing, patent. Review of the MIP images confirms the above findings CT Brain Perfusion Findings: CBF (<30%) Volume: 075mPerfusion (Tmax>6.0s) volume: 47m102mismatch Volume: 47mL103mfarction Location:None IMPRESSION: Suboptimal contrast bolus timing with significant venous enhancement. There is no large vessel occlusion or hemodynamically significant  stenosis. Probable mild intracranial atherosclerotic irregularity. Perfusion imaging does not demonstrate any evidence of core infarction or penumbra. Electronically Signed   By: Macy Mis M.D.   On: 10/17/2019 15:07   CT Code Stroke CTA Neck W/WO contrast  Result Date: 10/17/2019 CLINICAL DATA:  Code stroke follow-up EXAM: CT ANGIOGRAPHY HEAD AND NECK CT PERFUSION BRAIN TECHNIQUE: Multidetector CT imaging of the head and neck was performed using the standard protocol during bolus administration of intravenous contrast. Multiplanar CT image reconstructions and MIPs were obtained to evaluate the vascular anatomy. Carotid stenosis measurements (when applicable) are obtained utilizing NASCET criteria, using the distal internal carotid diameter as the denominator. Multiphase CT imaging of the brain was performed following IV bolus contrast injection. Subsequent parametric perfusion maps were calculated using RAPID software. CONTRAST:  175m OMNIPAQUE IOHEXOL 350  MG/ML SOLN COMPARISON:  None. FINDINGS: Suboptimal contrast bolus timing with significant venous enhancement. CTA NECK FINDINGS Aortic arch: Great vessel origins are patent. Right carotid system: Patent. Atherosclerotic wall thickening along the common carotid. Mild calcified and noncalcified plaque at the ICA origin causing minimal stenosis. Left carotid system: Patent. Atherosclerotic wall thickening along the common carotid. There is calcified and noncalcified plaque at the proximal ICA causing minimal stenosis. Vertebral arteries: Patent.  Left vertebral artery is dominant. Skeleton: Degenerative changes of the cervical spine. Other neck: There is a 1 cm nodule of the left thyroid lobe. No followup recommended (ref: J Am Coll Radiol. 2015 Feb;12(2): 143-50). Upper chest: No apical lung mass. Review of the MIP images confirms the above findings CTA HEAD FINDINGS Anterior circulation: Intracranial internal carotid arteries are patent with minor calcified plaque. Anterior and middle cerebral arteries are patent. There is likely atherosclerotic irregularity superimposed on artifact. Posterior circulation: Intracranial left vertebral artery is patent with calcified plaque causing mild stenosis. Right vertebral artery terminates as a PICA. Basilar artery is patent. Posterior cerebral arteries are patent. There is likely atherosclerotic irregularity superimposed artifact. Venous sinuses: As permitted by contrast timing, patent. Review of the MIP images confirms the above findings CT Brain Perfusion Findings: CBF (<30%) Volume: 075mPerfusion (Tmax>6.0s) volume: 51m56mismatch Volume: 51mL57mfarction Location:None IMPRESSION: Suboptimal contrast bolus timing with significant venous enhancement. There is no large vessel occlusion or hemodynamically significant stenosis. Probable mild intracranial atherosclerotic irregularity. Perfusion imaging does not demonstrate any evidence of core infarction or penumbra. Electronically  Signed   By: PranMacy Mis.   On: 10/17/2019 15:07   CT Code Stroke Cerebral Perfusion with contrast  Result Date: 10/17/2019 CLINICAL DATA:  Code stroke follow-up EXAM: CT ANGIOGRAPHY HEAD AND NECK CT PERFUSION BRAIN TECHNIQUE: Multidetector CT imaging of the head and neck was performed using the standard protocol during bolus administration of intravenous contrast. Multiplanar CT image reconstructions and MIPs were obtained to evaluate the vascular anatomy. Carotid stenosis measurements (when applicable) are obtained utilizing NASCET criteria, using the distal internal carotid diameter as the denominator. Multiphase CT imaging of the brain was performed following IV bolus contrast injection. Subsequent parametric perfusion maps were calculated using RAPID software. CONTRAST:  1051mL53mIPAQUE IOHEXOL 350 MG/ML SOLN COMPARISON:  None. FINDINGS: Suboptimal contrast bolus timing with significant venous enhancement. CTA NECK FINDINGS Aortic arch: Great vessel origins are patent. Right carotid system: Patent. Atherosclerotic wall thickening along the common carotid. Mild calcified and noncalcified plaque at the ICA origin causing minimal stenosis. Left carotid system: Patent. Atherosclerotic wall thickening along the common carotid. There is calcified and noncalcified plaque at the proximal ICA causing minimal stenosis.  Vertebral arteries: Patent.  Left vertebral artery is dominant. Skeleton: Degenerative changes of the cervical spine. Other neck: There is a 1 cm nodule of the left thyroid lobe. No followup recommended (ref: J Am Coll Radiol. 2015 Feb;12(2): 143-50). Upper chest: No apical lung mass. Review of the MIP images confirms the above findings CTA HEAD FINDINGS Anterior circulation: Intracranial internal carotid arteries are patent with minor calcified plaque. Anterior and middle cerebral arteries are patent. There is likely atherosclerotic irregularity superimposed on artifact. Posterior circulation:  Intracranial left vertebral artery is patent with calcified plaque causing mild stenosis. Right vertebral artery terminates as a PICA. Basilar artery is patent. Posterior cerebral arteries are patent. There is likely atherosclerotic irregularity superimposed artifact. Venous sinuses: As permitted by contrast timing, patent. Review of the MIP images confirms the above findings CT Brain Perfusion Findings: CBF (<30%) Volume: 69m Perfusion (Tmax>6.0s) volume: 091mMismatch Volume: 73m673mnfarction Location:None IMPRESSION: Suboptimal contrast bolus timing with significant venous enhancement. There is no large vessel occlusion or hemodynamically significant stenosis. Probable mild intracranial atherosclerotic irregularity. Perfusion imaging does not demonstrate any evidence of core infarction or penumbra. Electronically Signed   By: PraMacy MisD.   On: 10/17/2019 15:07   EEG adult  Result Date: 10/17/2019 YadLora HavensD     10/17/2019  4:52 PM Patient Name: Seth LavellN: [id number] Attending: PriLora Havensferring Physician/Provider: Dr. McNKathrynn Speedte: 10/17/2019 Duration: 25.36 mins Patient history: [age]ar old male with history of diabetes (CBG more than 700) who presented after an episode of left-sided weakness and seizure-like activity.  EEG evaluate for seizures. Level of alertness: awake, asleep AEDs during EEG study: Keppra, Ativan Technical aspects: This EEG study was done with scalp electrodes positioned according to the 10-20 International system of electrode placement. Electrical activity was acquired at a sampling rate of '500Hz'  and reviewed with a high frequency filter of '70Hz'  and a low frequency filter of '1Hz' . EEG data were recorded continuously and digitally stored. Description: No clear posterior dominant rhythm was seen. Sleep was characterized by sleep spindles (12-'14hz' ), maximal frontocentral.  EEG showed continuous generalized low amplitude 6-'9hz'  theta-alpha  activity.  Hyperventilation and photic stimulation were not performed. Abnormality - Continuous slow, generalized IMPRESSION: This study is suggestive of mild to moderate diffuse encephalopathy, non specific to etiology. No seizures or epileptiform discharges were seen throughout the recording. PriLora HavensCT HEAD CODE STROKE WO CONTRAST  Result Date: 10/17/2019 CLINICAL DATA:  Code stroke. Neuro deficit, acute, stroke suspected. Possible stroke. Additional history provided: Last known well 11 a.m., left hand and left foot, slurred speech, aphasia EXAM: CT HEAD WITHOUT CONTRAST TECHNIQUE: Contiguous axial images were obtained from the base of the skull through the vertex without intravenous contrast. COMPARISON:  No pertinent prior studies available for comparison. FINDINGS: Brain: There is no evidence of acute intracranial hemorrhage. There are two small cortically based infarcts within the high right parietal lobe which appear subacute to chronic (series 3, images 20-25) (series 6, image 23). Gray-white differentiation elsewhere appears preserved. There is no evidence of intracranial mass. No midline shift or extra-axial fluid collection. Mild generalized parenchymal atrophy. Vascular: No hyperdense vessel.  Atherosclerotic calcifications. Skull: Normal. Negative for fracture or focal lesion. Sinuses/Orbits: Visualized orbits demonstrate no acute abnormality. Mild ethmoid and maxillary sinus mucosal thickening. No significant mastoid effusion These results were called by telephone at the time of interpretation on 10/17/2019 at 1:43 pm to provider Dr. KirLeonel RamsayWho verbally acknowledged these results. IMPRESSION:  No evidence of acute intracranial hemorrhage. There are two small cortically based infarcts within the high right parietal lobe which are favored subacute to chronic. Consider brain MRI for confirmation, as clinically warranted. Mild generalized parenchymal atrophy. Electronically Signed    By: Kellie Simmering DO   On: 10/17/2019 13:45    Microbiology: Recent Results (from the past 240 hour(s))  Respiratory Panel by RT PCR (Flu A&B, Covid) - Nasopharyngeal Swab     Status: None   Collection Time: 10/17/19  1:51 PM   Specimen: Nasopharyngeal Swab  Result Value Ref Range Status   SARS Coronavirus 2 by RT PCR NEGATIVE NEGATIVE Final    Comment: (NOTE) SARS-CoV-2 target nucleic acids are NOT DETECTED. The SARS-CoV-2 RNA is generally detectable in upper respiratoy specimens during the acute phase of infection. The lowest concentration of SARS-CoV-2 viral copies this assay can detect is 131 copies/mL. A negative result does not preclude SARS-Cov-2 infection and should not be used as the sole basis for treatment or other patient management decisions. A negative result may occur with  improper specimen collection/handling, submission of specimen other than nasopharyngeal swab, presence of viral mutation(s) within the areas targeted by this assay, and inadequate number of viral copies (<131 copies/mL). A negative result must be combined with clinical observations, patient history, and epidemiological information. The expected result is Negative. Fact Sheet for Patients:  PinkCheek.be Fact Sheet for Healthcare Providers:  GravelBags.it This test is not yet ap proved or cleared by the [place] FDA and  has been authorized for detection and/or diagnosis of SARS-CoV-2 by FDA under an Emergency Use Authorization (EUA). This EUA will remain  in effect (meaning this test can be used) for the duration of the COVID-19 declaration under Section 564(b)(1) of the Act, 21 U.S.C. section 360bbb-3(b)(1), unless the authorization is terminated or revoked sooner.    Influenza A by PCR NEGATIVE NEGATIVE Final   Influenza B by PCR NEGATIVE NEGATIVE Final    Comment: (NOTE) The Xpert Xpress SARS-CoV-2/FLU/RSV assay is intended as an  aid in  the diagnosis of influenza from Nasopharyngeal swab specimens and  should not be used as a sole basis for treatment. Nasal washings and  aspirates are unacceptable for Xpert Xpress SARS-CoV-2/FLU/RSV  testing. Fact Sheet for Patients: PinkCheek.be Fact Sheet for Healthcare Providers: GravelBags.it This test is not yet approved or cleared by the [place] FDA and  has been authorized for detection and/or diagnosis of SARS-CoV-2 by  FDA under an Emergency Use Authorization (EUA). This EUA will remain  in effect (meaning this test can be used) for the duration of the  Covid-19 declaration under Section 564(b)(1) of the Act, 21  U.S.C. section 360bbb-3(b)(1), unless the authorization is  terminated or revoked. Performed at [facility], [place] [address], [place], [place] [address]      Labs: Basic Metabolic Panel: Recent Labs  Lab 10/17/19 1314 10/17/19 1316 10/17/19 2215 10/18/19 0429 10/19/19 0446  NA 125* 127* 137 137 136  K 4.8 4.8 3.7 3.5 3.7  CL 90* 92* 102 104 103  CO2 17*  --  '24 25 23  ' GLUCOSE 818* >700* 188* 179* 192*  BUN 33* 33* 21* 18 15  CREATININE 1.68* 1.40* 1.00 1.07 1.03  CALCIUM 9.0  --  8.5* 8.5* 8.5*  MG  --   --  2.1  --   --   PHOS  --   --  3.8  --   --    Liver Function  Tests: Recent Labs  Lab 10/17/19 1314  AST 41  ALT 35  ALKPHOS 109  BILITOT 0.9  PROT 7.2  ALBUMIN 3.6   No results for input(s): LIPASE, AMYLASE in the last 168 hours. No results for input(s): AMMONIA in the last 168 hours. CBC: Recent Labs  Lab 10/17/19 1314 10/17/19 1316  WBC 15.2*  --   NEUTROABS 13.6*  --   HGB 14.3 15.0  HCT 43.2 44.0  MCV 83.7  --   PLT 202  --    Cardiac Enzymes: No results for input(s): CKTOTAL, CKMB, CKMBINDEX, TROPONINI in the last 168 hours. BNP: BNP (last 3 results) Recent Labs    07/23/19 1654  BNP 798*    ProBNP (last 3 results) No results for  input(s): PROBNP in the last 8760 hours.  CBG: Recent Labs  Lab 10/18/19 2115 10/19/19 0009 10/19/19 0401 10/19/19 0808 10/19/19 1135  GLUCAP 296* 199* 195* 140* 162*    Active Problems:   Hypertension associated with diabetes ([place])   Hyperlipidemia   CVA (cerebral vascular accident) ([place])   Seizure ([place])   DM (diabetes mellitus), type 1, uncontrolled ([place])  Time coordinating discharge: 38 minutes  Signed:        Aahil Fredin, DO [facility]  10/20/2019, 7:42 AM

## 2019-10-29 ENCOUNTER — Ambulatory Visit: Payer: BC Managed Care – PPO | Admitting: Podiatry

## 2019-11-01 ENCOUNTER — Telehealth: Payer: Self-pay

## 2019-11-01 ENCOUNTER — Emergency Department
Admission: EM | Admit: 2019-11-01 | Discharge: 2019-11-01 | Disposition: A | Discharge status: Home / Self Care | Payer: Self-pay | Source: Home / Self Care | Attending: Family Medicine | Admitting: Family Medicine

## 2019-11-01 ENCOUNTER — Other Ambulatory Visit: Payer: Self-pay

## 2019-11-01 DIAGNOSIS — R110 Nausea: Secondary | ICD-10-CM

## 2019-11-01 DIAGNOSIS — I5023 Acute on chronic systolic (congestive) heart failure: Secondary | ICD-10-CM

## 2019-11-01 MED ORDER — PROMETHAZINE HCL 12.5 MG PO TABS: ORAL_TABLET | ORAL | 0 refills | Status: DC

## 2019-11-01 MED ORDER — ONDANSETRON 4 MG PO TBDP
4.0000 mg | ORAL_TABLET | Freq: Once | ORAL | Status: AC
  Administered 2019-11-01: 4 mg via ORAL

## 2019-11-01 MED ORDER — TORSEMIDE 20 MG PO TABS: 20.0000 mg | ORAL_TABLET | Freq: Two times a day (BID) | ORAL | 0 refills | Status: DC

## 2019-11-01 NOTE — Telephone Encounter (Signed)
[place] per Dr Assunta Found to cancel rx. Pt picking up at [facility] tonight instead as they are open later.

## 2019-11-01 NOTE — ED Triage Notes (Signed)
Patient presents to Urgent Care with complaints of increased fatigue and fluid retention since about 4-5 days ago. Patient reports he has CKD and HF and took an extra lasix for the past three days and it has not been hleping. Patient is also newly on seizure medication for new-onset seizures he was admitted for a few weeks ago.

## 2019-11-01 NOTE — ED Provider Notes (Signed)
[name]    CSN: [id number] Arrival date & time: 11/01/19  1646      History   Chief Complaint Chief Complaint  Patient presents with  . Fatigue    HPI Waldron Gerry is a 59 y.o. male.   Patient complains of increased fatigue, fluid retention, and lower leg edema for 4 to 5 days.  He normally has shortness of breath with activity, but no increase during the past several days.  He denies chest pain, cough, and fevers, chills, and sweats.  During the past 2 to 3 days he has had nausea with occasional vomiting, but no abdominal pain. Patient has chronic CHF, having been started on Entresto on 10/16/19.  During the past two days he has doubled his Lasix dose.  He has diabetes, but denies recent change (while in our office he measures his glucose at 140). He was discharged from hospital 10/20/19 after evaluation/treatment of a new onset seizure disorder.  He was prescribed Keppra 1gm BID, denying further seizure activity.  The history is provided by the patient.    Past Medical History:  Diagnosis Date  . CHF (congestive heart failure) ([place]) 04/11/2018  . Diabetes mellitus ([place])   . GAD (generalized anxiety disorder) 07/17/2015  . High triglycerides   . Hypertension   . MDD (major depressive disorder) 03/17/2018    Patient Active Problem List   Diagnosis Date Noted  . Seizure ([place]) 10/18/2019  . DM (diabetes mellitus), type 1, uncontrolled ([place]) 10/18/2019  . CVA (cerebral vascular accident) ([place]) 10/17/2019  . Acute respiratory failure with hypoxia ([place]) 07/26/2019  . Acute kidney failure ([place]) 07/24/2019  . Elevated troponin 07/24/2019  . Hyperlipidemia 03/16/2019  . Onychomycosis of multiple toenails with type 2 diabetes mellitus ([place]) 11/30/2018  . Hypertension associated with diabetes ([place]) 11/24/2018  . Coronary artery disease involving native coronary artery of native heart without angina pectoris 04/14/2018  . Chronic systolic congestive heart failure ([place])  04/11/2018  . Nonischemic cardiomyopathy ([place]) 04/11/2018  . Pericardial effusion 04/11/2018  . Tachycardia 04/11/2018  . MDD (major depressive disorder) 03/17/2018  . Metatarsalgia 03/17/2018  . Carpal tunnel syndrome 03/17/2018  . Peripheral neuropathy 06/16/2017  . Type 2 diabetes mellitus with diabetic neuropathy, unspecified ([place]) 06/16/2017  . Chronic pain 04/26/2017  . Bilateral tennis elbow 03/22/2017  . Impingement syndrome of right shoulder region 06/28/2016  . Skin lesion 03/03/2016  . GAD (generalized anxiety disorder) 07/17/2015  . Pes cavus 01/28/2015  . Umbilical hernia 92/33/0076  . Benign hypertension with coincident congestive heart failure ([place]) 10/19/2013  . BPV (benign positional vertigo) 07/23/2013  . Type 2 diabetes mellitus with peripheral neuropathy ([place]) 05/25/2013  . Erectile dysfunction 05/25/2013    Past Surgical History:  Procedure Laterality Date  . TONSILLECTOMY         Home Medications    Prior to Admission medications   Medication Sig Start Date End Date Taking? Authorizing Provider  alprazolam Duanne Moron) 2 MG tablet Take 1 tablet (2 mg total) by mouth 3 (three) times daily as needed. for anxiety. #90 for 30 days 10/18/19 11/17/19  Emeterio Reeve, DO  AMBULATORY NON FORMULARY MEDICATION Single glucometer of choice with lancets, test strips. Test 3x daily.  E11.65 Disp QS 1 months 05/20/17   Gregor Hams, MD  aspirin EC 81 MG tablet Take 81 mg by mouth daily.  03/16/19 03/15/20  [provider]  atorvastatin (LIPITOR) 40 MG tablet Take 1 tablet (40 mg total) by mouth daily at 6  PM. 10/19/19   Swayze, Ava, DO  buPROPion (WELLBUTRIN XL) 150 MG 24 hr tablet Take 1 tablet by mouth once daily in the morning Patient taking differently: Take 150 mg by mouth daily.  09/06/19   Emeterio Reeve, DO  chlorhexidine (PERIDEX) 0.12 % solution Use as directed 10 mLs in the mouth or throat 4 (four) times daily. 09/06/19   Emeterio Reeve, DO   Continuous Blood Gluc Sensor (FREESTYLE LIBRE 14 DAY SENSOR) MISC USE AS DIRECTED CHANGE  EVERY  14  DAYS 03/20/19   Gregor Hams, MD  furosemide (LASIX) 40 MG tablet Take 1 tablet (40 mg total) by mouth daily. 10/04/19   Emeterio Reeve, DO  insulin glargine (LANTUS) 100 UNIT/ML injection Inject 0.4 mLs (40 Units total) into the skin at bedtime. 10/19/19   Swayze, Ava, DO  Insulin Pen Needle (PEN NEEDLES) 31G X 8 MM MISC Use with lantus pen 10/19/19   Swayze, Ava, DO  levETIRAcetam (KEPPRA) 1000 MG tablet Take 1 tablet (1,000 mg total) by mouth 2 (two) times daily. 10/19/19   Swayze, Ava, DO  levocetirizine (XYZAL) 5 MG tablet Take 1 tablet (5 mg total) by mouth every evening. 06/16/18   Gregor Hams, MD  loperamide (IMODIUM A-D) 2 MG tablet Take 1 tablet (2 mg total) by mouth 4 (four) times daily as needed for diarrhea or loose stools. 06/16/18   Gregor Hams, MD  losartan (COZAAR) 25 MG tablet Take by mouth. 03/16/19   [provider]  metoprolol succinate (TOPROL-XL) 50 MG 24 hr tablet Take 50 mg by mouth daily. 04/18/18   [provider]  Multiple Vitamin (MULTI-VITAMIN) tablet Take 1 tablet by mouth daily.     [provider]  nitroGLYCERIN (NITROSTAT) 0.4 MG SL tablet PLACE 1 TABLET (0.4 MG TOTAL) UNDER THE TONGUE EVERY 5 (FIVE) MINUTES AS NEEDED FOR CHEST PAIN. 06/12/19   Silverio Decamp, MD  omeprazole (PRILOSEC) 40 MG capsule Take 1 capsule (40 mg total) by mouth daily. 03/16/19   Gregor Hams, MD  ondansetron (ZOFRAN) 8 MG tablet Take 0.5-1 tablets (4-8 mg total) by mouth every 8 (eight) hours as needed for nausea or vomiting. 10/19/19   Swayze, Ava, DO  promethazine (PHENERGAN) 12.5 MG tablet Take one or two tabs PO Q6hr prn nausea 11/01/19   Kandra Nicolas, MD  torsemide (DEMADEX) 20 MG tablet Take 1 tablet (20 mg total) by mouth 2 (two) times daily. 11/01/19   Kandra Nicolas, MD    Family History Family History  Problem Relation Age of Onset  . Heart  failure Mother     Social History Social History   Tobacco Use  . Smoking status: Never Smoker  . Smokeless tobacco: Never Used  Substance Use Topics  . Alcohol use: Yes  . Drug use: No     Allergies   Bee venom, Clindamycin/lincomycin, Pregabalin, Penicillins, and Terramycin [oxytetracycline]   Review of Systems Review of Systems  Constitutional: Positive for activity change, appetite change, fatigue and unexpected weight change. Negative for chills, diaphoresis and fever.  HENT: Negative.   Eyes: Negative.   Respiratory: Positive for cough and shortness of breath. Negative for chest tightness, wheezing and stridor.   Cardiovascular: Positive for leg swelling. Negative for chest pain.  Gastrointestinal: Negative.   Genitourinary: Negative.   Musculoskeletal: Negative.   Neurological: Negative for dizziness, seizures, syncope, speech difficulty, light-headedness and headaches.     Physical Exam Triage Vital Signs ED Triage Vitals  Enc  Vitals Group     BP 11/01/19 1701 111/78     Pulse Rate 11/01/19 1701 79     Resp 11/01/19 1701 16     Temp 11/01/19 1701 98.4 F (36.9 C)     Temp Source 11/01/19 1701 Oral     SpO2 11/01/19 1701 98 %     Weight --      Height --      Head Circumference --      Peak Flow --      Pain Score 11/01/19 1659 0     Pain Loc --      Pain Edu? --      Excl. in [place]? --    No data found.  Updated Vital Signs BP 111/78 (BP Location: Right Arm)   Pulse 79   Temp 98.4 F (36.9 C) (Oral)   Resp 16   Wt 86.2 kg   SpO2 98%   BMI 28.89 kg/m   Visual Acuity Right Eye Distance:   Left Eye Distance:   Bilateral Distance:    Right Eye Near:   Left Eye Near:    Bilateral Near:     Physical Exam Nursing notes and Vital Signs reviewed. Appearance:  Patient appears stated age, and in no acute distress.  He is alert and oriented.    Eyes:  Pupils are equal, round, and reactive to light and accomodation.  Extraocular movement is  intact.  Conjunctivae are not inflamed   Pharynx:  Normal; moist mucous membranes  Neck:  Supple.  No adenopathy.  No JVD Lungs:  Clear to auscultation.  Breath sounds are equal.  Moving air well. Heart:  Regular rate and rhythm without murmurs, rubs, or gallops.  Abdomen:  Nontender without masses or hepatosplenomegaly.  Bowel sounds are present.  No CVA or flank tenderness.  Extremities:   2+ lower leg edema. Skin:  No rash present.     UC Treatments / Results  Labs (all labs ordered are listed, but only abnormal results are displayed) Labs Reviewed  EXTRA SPECIMEN  BRAIN NATRIURETIC PEPTIDE  COMPLETE METABOLIC PANEL WITH GFR  CBC WITH DIFFERENTIAL/PLATELET    EKG   Radiology No results found.  Procedures Procedures (including critical care time)  Medications Ordered in UC Medications  ondansetron (ZOFRAN-ODT) disintegrating tablet 4 mg (4 mg Oral Given 11/01/19 1707)    Initial Impression / Assessment and Plan / UC Course  I have reviewed the triage vital signs and the nursing notes.  Pertinent labs & imaging results that were available during my care of the patient were reviewed by me and considered in my medical decision making (see chart for details).    Check BNP, CMP, and CBC/diff Switch to Demadex 20mg  BID for five days. Administered Zofran ODT 4mg  PO.  Rx Phenergan for nausea. Followup with PCP in 7 to 10 days.   Final Clinical Impressions(s) / UC Diagnoses   Final diagnoses:  Nausea without vomiting  Acute on chronic systolic congestive heart failure [facility])     Discharge Instructions     Stop Lasix for now and switch to [facility] for five days.  If symptoms become significantly worse during the night or over the weekend, proceed to the local emergency room.     ED Prescriptions    Medication Sig Dispense Auth. Provider   torsemide (DEMADEX) 20 MG tablet  (Status: Discontinued) Take 1 tablet (20 mg total) by mouth 2 (two) times daily. 10 tablet  Kandra Nicolas, MD  promethazine (PHENERGAN) 12.5 MG tablet  (Status: Discontinued) Take one or two tabs PO Q6hr prn nausea 20 tablet Kandra Nicolas, MD   promethazine (PHENERGAN) 12.5 MG tablet Take one or two tabs PO Q6hr prn nausea 20 tablet Kandra Nicolas, MD   torsemide (DEMADEX) 20 MG tablet Take 1 tablet (20 mg total) by mouth 2 (two) times daily. 10 tablet Kandra Nicolas, MD        Kandra Nicolas, MD 11/03/19 1150

## 2019-11-01 NOTE — Discharge Instructions (Addendum)
Stop Lasix for now and switch to Demadex for five days.  If symptoms become significantly worse during the night or over the weekend, proceed to the local emergency room.

## 2019-11-02 ENCOUNTER — Ambulatory Visit: Payer: BC Managed Care – PPO | Admitting: Podiatry

## 2019-11-05 ENCOUNTER — Telehealth: Payer: Self-pay

## 2019-11-05 LAB — BRAIN NATRIURETIC PEPTIDE: Brain Natriuretic Peptide: 625 pg/mL — ABNORMAL HIGH (ref <100)

## 2019-11-05 LAB — EXTRA SPECIMEN

## 2019-11-05 LAB — COMPLETE METABOLIC PANEL WITH GFR

## 2019-11-05 LAB — CBC WITH DIFFERENTIAL/PLATELET

## 2019-11-05 MED ORDER — GENERIC EXTERNAL MEDICATION
Status: DC
Start: ? — End: 2019-11-05

## 2019-11-05 MED ORDER — METOCLOPRAMIDE HCL 5 MG/ML IJ SOLN
10.00 | INTRAMUSCULAR | Status: DC
Start: ? — End: 2019-11-05

## 2019-11-05 MED ORDER — SODIUM CHLORIDE 0.9 % IV SOLN
10.00 | INTRAVENOUS | Status: DC
Start: ? — End: 2019-11-05

## 2019-11-05 MED ORDER — SACUBITRIL-VALSARTAN 24-26 MG PO TABS
1.00 | ORAL_TABLET | ORAL | Status: DC
Start: 2019-11-06 — End: 2019-11-05

## 2019-11-05 MED ORDER — ALPRAZOLAM 0.25 MG PO TABS
0.25 | ORAL_TABLET | ORAL | Status: DC
Start: ? — End: 2019-11-05

## 2019-11-05 MED ORDER — ASPIRIN 81 MG PO TBEC
81.00 | DELAYED_RELEASE_TABLET | ORAL | Status: DC
Start: 2019-11-07 — End: 2019-11-05

## 2019-11-05 MED ORDER — SENNOSIDES-DOCUSATE SODIUM 8.6-50 MG PO TABS
1.00 | ORAL_TABLET | ORAL | Status: DC
Start: ? — End: 2019-11-05

## 2019-11-05 MED ORDER — GUAIFENESIN 100 MG/5ML PO SYRP
200.00 | ORAL_SOLUTION | ORAL | Status: DC
Start: ? — End: 2019-11-05

## 2019-11-05 MED ORDER — MUPIROCIN 2 % EX OINT
TOPICAL_OINTMENT | CUTANEOUS | Status: DC
Start: 2019-11-06 — End: 2019-11-05

## 2019-11-05 MED ORDER — HYDRALAZINE HCL 25 MG PO TABS
25.00 | ORAL_TABLET | ORAL | Status: DC
Start: 2019-11-05 — End: 2019-11-05

## 2019-11-05 MED ORDER — HYDROCODONE-ACETAMINOPHEN 5-325 MG PO TABS
1.00 | ORAL_TABLET | ORAL | Status: DC
Start: ? — End: 2019-11-05

## 2019-11-05 MED ORDER — TICAGRELOR 90 MG PO TABS
90.00 | ORAL_TABLET | ORAL | Status: DC
Start: 2019-11-06 — End: 2019-11-05

## 2019-11-05 MED ORDER — MAGNESIUM HYDROXIDE 400 MG/5ML PO SUSP
30.00 | ORAL | Status: DC
Start: ? — End: 2019-11-05

## 2019-11-05 MED ORDER — NITROGLYCERIN 0.4 MG SL SUBL
0.40 | SUBLINGUAL_TABLET | SUBLINGUAL | Status: DC
Start: ? — End: 2019-11-05

## 2019-11-05 MED ORDER — PROMETHAZINE HCL 25 MG/ML IJ SOLN
12.50 | INTRAMUSCULAR | Status: DC
Start: ? — End: 2019-11-05

## 2019-11-05 MED ORDER — INSULIN GLARGINE 100 UNIT/ML ~~LOC~~ SOLN
1.00 | SUBCUTANEOUS | Status: DC
Start: ? — End: 2019-11-05

## 2019-11-05 MED ORDER — BUPROPION HCL ER (XL) 150 MG PO TB24
150.00 | ORAL_TABLET | ORAL | Status: DC
Start: 2019-11-07 — End: 2019-11-05

## 2019-11-05 MED ORDER — PANTOPRAZOLE SODIUM 40 MG PO TBEC
40.00 | DELAYED_RELEASE_TABLET | ORAL | Status: DC
Start: 2019-11-07 — End: 2019-11-05

## 2019-11-05 MED ORDER — METOPROLOL SUCCINATE ER 50 MG PO TB24
50.00 | ORAL_TABLET | ORAL | Status: DC
Start: 2019-11-07 — End: 2019-11-05

## 2019-11-05 MED ORDER — MORPHINE SULFATE (PF) 2 MG/ML IV SOLN
2.00 | INTRAVENOUS | Status: DC
Start: ? — End: 2019-11-05

## 2019-11-05 MED ORDER — INSULIN LISPRO 100 UNIT/ML ~~LOC~~ SOLN
1.00 | SUBCUTANEOUS | Status: DC
Start: ? — End: 2019-11-05

## 2019-11-05 MED ORDER — LEVETIRACETAM 500 MG PO TABS
500.00 | ORAL_TABLET | ORAL | Status: DC
Start: 2019-11-06 — End: 2019-11-05

## 2019-11-05 MED ORDER — INSULIN LISPRO 100 UNIT/ML ~~LOC~~ SOLN
1.00 | SUBCUTANEOUS | Status: DC
Start: 2019-11-06 — End: 2019-11-05

## 2019-11-05 MED ORDER — MAGNESIUM OXIDE 400 MG PO TABS
800.00 | ORAL_TABLET | ORAL | Status: DC
Start: 2019-11-05 — End: 2019-11-05

## 2019-11-05 MED ORDER — ATORVASTATIN CALCIUM 80 MG PO TABS
80.00 | ORAL_TABLET | ORAL | Status: DC
Start: 2019-11-05 — End: 2019-11-05

## 2019-11-05 MED ORDER — HYDROCODONE-ACETAMINOPHEN 10-325 MG PO TABS
1.00 | ORAL_TABLET | ORAL | Status: DC
Start: ? — End: 2019-11-05

## 2019-11-05 MED ORDER — INSULIN GLARGINE 100 UNIT/ML ~~LOC~~ SOLN
1.00 | SUBCUTANEOUS | Status: DC
Start: 2019-11-06 — End: 2019-11-05

## 2019-11-05 NOTE — Telephone Encounter (Signed)
Can we confirm w/ patient Rx dose he's using? Thanks

## 2019-11-05 NOTE — Telephone Encounter (Signed)
[facility] requesting med refills for gabapentin. Rx not listed in active med list. As per pharmacist - SIG directions - "take 1 capsule by mouth at betime for 7 days, then take 1 tab twice daily, then 1 tab three times daily. May double wkly to a max of 3600 mg/day". Pls advise, thanks.

## 2019-11-06 MED ORDER — GABAPENTIN 300 MG PO CAPS: 300.0000 mg | ORAL_CAPSULE | Freq: Three times a day (TID) | ORAL | 0 refills | Status: DC

## 2019-11-06 NOTE — Telephone Encounter (Signed)
Refilled but he is very overdue for hospital followup, needs to come in / virtual visit

## 2019-11-06 NOTE — Telephone Encounter (Signed)
At provider's request contact patient regarding gabapentin med refill. As per pt, taking 1 capsule/TID daily.

## 2019-11-06 NOTE — Telephone Encounter (Signed)
Task completed. Pt has been updated regarding provider's note and med refill for gabapentin. Pt was agreeable with provider's recommendation. Pt has been scheduled for 11/07/19 with PCP. No other inquiries during call.

## 2019-11-07 ENCOUNTER — Encounter: Payer: Self-pay | Admitting: Osteopathic Medicine

## 2019-11-07 ENCOUNTER — Ambulatory Visit (INDEPENDENT_AMBULATORY_CARE_PROVIDER_SITE_OTHER): Payer: Self-pay | Admitting: Osteopathic Medicine

## 2019-11-07 ENCOUNTER — Other Ambulatory Visit: Payer: Self-pay

## 2019-11-07 VITALS — BP 124/83 | HR 70 | Temp 98.1°F | Wt 198.0 lb

## 2019-11-07 DIAGNOSIS — I213 ST elevation (STEMI) myocardial infarction of unspecified site: Secondary | ICD-10-CM

## 2019-11-07 DIAGNOSIS — E1142 Type 2 diabetes mellitus with diabetic polyneuropathy: Secondary | ICD-10-CM

## 2019-11-07 DIAGNOSIS — F329 Major depressive disorder, single episode, unspecified: Secondary | ICD-10-CM

## 2019-11-07 DIAGNOSIS — F419 Anxiety disorder, unspecified: Secondary | ICD-10-CM

## 2019-11-07 DIAGNOSIS — Z79899 Other long term (current) drug therapy: Secondary | ICD-10-CM

## 2019-11-07 DIAGNOSIS — E782 Mixed hyperlipidemia: Secondary | ICD-10-CM

## 2019-11-07 DIAGNOSIS — N184 Chronic kidney disease, stage 4 (severe): Secondary | ICD-10-CM

## 2019-11-07 DIAGNOSIS — F32A Depression, unspecified: Secondary | ICD-10-CM

## 2019-11-07 DIAGNOSIS — R7401 Elevation of levels of liver transaminase levels: Secondary | ICD-10-CM

## 2019-11-07 MED ORDER — FUROSEMIDE 40 MG PO TABS: 40.0000 mg | ORAL_TABLET | Freq: Every day | ORAL | 0 refills | Status: DC

## 2019-11-07 MED ORDER — ONDANSETRON 8 MG PO TBDP: 8.0000 mg | ORAL_TABLET | Freq: Three times a day (TID) | ORAL | 3 refills | Status: DC | PRN

## 2019-11-07 MED ORDER — OXYCODONE-ACETAMINOPHEN 10-325 MG PO TABS: 1.0000 | ORAL_TABLET | Freq: Three times a day (TID) | ORAL | 0 refills | Status: AC | PRN

## 2019-11-07 MED ORDER — GENERIC EXTERNAL MEDICATION
Status: DC
Start: ? — End: 2019-11-07

## 2019-11-07 MED ORDER — METOPROLOL SUCCINATE ER 25 MG PO TB24: 25.0000 mg | ORAL_TABLET | Freq: Every day | ORAL | 3 refills | Status: AC

## 2019-11-07 MED ORDER — ATORVASTATIN CALCIUM 40 MG PO TABS: 40.0000 mg | ORAL_TABLET | Freq: Every day | ORAL | 3 refills | Status: DC

## 2019-11-07 MED ORDER — FUROSEMIDE 40 MG PO TABS
40.00 | ORAL_TABLET | ORAL | Status: DC
Start: ? — End: 2019-11-07

## 2019-11-07 NOTE — Progress Notes (Signed)
Seth Weeks is a 59 y.o. male who presents to  [place] at [facility]  today, 11/07/19, seeking care for the following: . Hospital follow-up . Pain management    ASSESSMENT & PLAN with other pertinent history/findings:  The primary encounter diagnosis was ST elevation myocardial infarction (STEMI), unspecified artery ([place]). Diagnoses of Anxiety and depression, Type 2 diabetes mellitus with peripheral neuropathy ([place]), Mixed hyperlipidemia, Transaminitis, CKD (chronic kidney disease) stage 4, GFR 15-29 ml/min ([place]), and Controlled substance agreement signed were also pertinent to this visit.   Discharge records reviewed from recent hospitalization.  Patient suffered a STEMI, underwent cardiac catheterization with thrombectomy of distal right coronary artery, angioplasty of distal RCA, PCI to distal and proximal RCA.  Patient was started on aspirin, Brilinta.  Decreased Lasix dose and metoprolol dose.  Plan to be on aspirin/Brilinta for 1 month, then will be on Plavix  Statins were held due to elevated LFT Abdominal ultrasound showed coarsened echotexture to the liver On review of labs, LFTs were trending down as of time of discharge, I think would be okay to start low-dose statin with close follow-up of liver enzymes  Other pertinent diagnoses: AKI -creatinine stable around 2, monitor closely when we recheck liver enzymes Acute on chronic CHF Uncontrolled type 2 diabetes  Patient is benzodiazepine and opiate dependent.  He and I have discussed before the dangers of coadministration of these medications, including oversedation/death, particularly with his impaired drug clearance due to CKD.  I will absolutely not increase the dose of his medications.  Admittedly, he puts me in a difficult situation because he freely admits to getting opiates off the street when he cannot get them through prescriptions, adamant that he needs his current dose in  order to function.  I think the lesser of 2 evils here is to maintain his prescriptions with close monitoring, will plan for UDS on next visit, if there is any concern for diversion whatsoever we will taper off of these medications.    Orders Placed This Encounter  Procedures  . COMPLETE METABOLIC PANEL WITH GFR    Meds ordered this encounter  Medications  . DISCONTD: atorvastatin (LIPITOR) 40 MG tablet    Sig: Take 1 tablet (40 mg total) by mouth daily.    Dispense:  90 tablet    Refill:  3  . furosemide (LASIX) 40 MG tablet    Sig: Take 1 tablet (40 mg total) by mouth daily.    Dispense:  30 tablet    Refill:  0  . metoprolol succinate (TOPROL-XL) 25 MG 24 hr tablet    Sig: Take 1 tablet (25 mg total) by mouth daily.    Dispense:  90 tablet    Refill:  3  . ondansetron (ZOFRAN-ODT) 8 MG disintegrating tablet    Sig: Take 1 tablet (8 mg total) by mouth every 8 (eight) hours as needed for nausea.    Dispense:  20 tablet    Refill:  3  . oxyCODONE-acetaminophen (PERCOCET) 10-325 MG tablet    Sig: Take 1 tablet by mouth every 8 (eight) hours as needed for pain.    Dispense:  90 tablet    Refill:  0  . atorvastatin (LIPITOR) 40 MG tablet    Sig: Take 0.5 tablets (20 mg total) by mouth daily.    Dispense:  90 tablet    Refill:  3       Follow-up instructions: Return in about 2-3weeks for LAB VISIT ONLY  AND OFFICE VISIT W/ DR Sheppard Coil MONITOR SUGARS AROUND 01/01/2020.                                         BP 124/83 (BP Location: Left Arm, Patient Position: Sitting, Cuff Size: Normal)   Pulse 70   Temp 98.1 F (36.7 C) (Oral)   Wt 198 lb (89.8 kg)   BMI 30.11 kg/m   Current Meds  Medication Sig  . alprazolam (XANAX) 2 MG tablet Take 1 tablet (2 mg total) by mouth 3 (three) times daily as needed. for anxiety. #90 for 30 days  . AMBULATORY NON FORMULARY MEDICATION Single glucometer of choice with lancets, test strips. Test 3x  daily.  E11.65 Disp QS 1 months  . aspirin EC 81 MG tablet Take 81 mg by mouth daily.   [name] atorvastatin (LIPITOR) 40 MG tablet Take 1 tablet (40 mg total) by mouth daily at 6 PM.  . buPROPion (WELLBUTRIN XL) 150 MG 24 hr tablet Take 1 tablet by mouth once daily in the morning (Patient taking differently: Take 150 mg by mouth daily. )  . chlorhexidine (PERIDEX) 0.12 % solution Use as directed 10 mLs in the mouth or throat 4 (four) times daily.  . Continuous Blood Gluc Sensor (FREESTYLE LIBRE 14 DAY SENSOR) MISC USE AS DIRECTED CHANGE  EVERY  14  DAYS  . furosemide (LASIX) 40 MG tablet Take 1 tablet (40 mg total) by mouth daily.  [name] gabapentin (NEURONTIN) 300 MG capsule Take 1 capsule (300 mg total) by mouth 3 (three) times daily.  . insulin glargine (LANTUS) 100 UNIT/ML injection Inject 0.4 mLs (40 Units total) into the skin at bedtime.  . Insulin Pen Needle (PEN NEEDLES) 31G X 8 MM MISC Use with lantus pen  . levETIRAcetam (KEPPRA) 1000 MG tablet Take 1 tablet (1,000 mg total) by mouth 2 (two) times daily.  [name] levocetirizine (XYZAL) 5 MG tablet Take 1 tablet (5 mg total) by mouth every evening.  . loperamide (IMODIUM A-D) 2 MG tablet Take 1 tablet (2 mg total) by mouth 4 (four) times daily as needed for diarrhea or loose stools.  [name] losartan (COZAAR) 25 MG tablet Take by mouth.  . metoprolol succinate (TOPROL-XL) 50 MG 24 hr tablet Take 50 mg by mouth daily.  . Multiple Vitamin (MULTI-VITAMIN) tablet Take 1 tablet by mouth daily.   . nitroGLYCERIN (NITROSTAT) 0.4 MG SL tablet PLACE 1 TABLET (0.4 MG TOTAL) UNDER THE TONGUE EVERY 5 (FIVE) MINUTES AS NEEDED FOR CHEST PAIN.  [name] omeprazole (PRILOSEC) 40 MG capsule Take 1 capsule (40 mg total) by mouth daily.  . ondansetron (ZOFRAN) 8 MG tablet Take 0.5-1 tablets (4-8 mg total) by mouth every 8 (eight) hours as needed for nausea or vomiting.  . promethazine (PHENERGAN) 12.5 MG tablet Take one or two tabs PO Q6hr prn nausea  . ticagrelor (BRILINTA) 90 MG  TABS tablet Take by mouth.  . torsemide (DEMADEX) 20 MG tablet Take 1 tablet (20 mg total) by mouth 2 (two) times daily.    No results found for this or any previous visit (from the past 72 hour(s)).  No results found.  Depression screen [facility] 2/9 04/27/2019 11/24/2018 08/18/2018  Decreased Interest 3 3 2   Down, Depressed, Hopeless 3 2 3   PHQ - 2 Score 6 5 5   Altered sleeping 3 3 3   Tired, decreased energy 3 3 3  Change in appetite 3 1 1   Feeling bad or failure about yourself  3 3 3   Trouble concentrating 3 3 1   Moving slowly or fidgety/restless 3 2 0  Suicidal thoughts 3 2 1   PHQ-9 Score 27 22 17   Difficult doing work/chores Extremely dIfficult Very difficult Very difficult  Some recent data might be hidden    GAD 7 : Generalized Anxiety Score 09/26/2019 04/27/2019 11/24/2018 08/18/2018  Nervous, Anxious, on Edge 3 3 2 2   Control/stop worrying 3 3 2 3   Worry too much - different things 3 3 2 3   Trouble relaxing 3 3 3 2   Restless 1 1 2 1   Easily annoyed or irritable 3 3 2 1   Afraid - awful might happen 3 3 0 3  Total GAD 7 Score 19 19 13 15   Anxiety Difficulty Extremely difficult Extremely difficult Very difficult Very difficult      All questions at time of visit were answered - patient instructed to contact office with any additional concerns or updates.  ER/RTC precautions were reviewed with the patient.  Please note: voice recognition software was used to produce this document, and typos may escape review. Please contact Dr. Sheppard Coil for any needed clarifications.

## 2019-11-08 DIAGNOSIS — Z79899 Other long term (current) drug therapy: Secondary | ICD-10-CM | POA: Insufficient documentation

## 2019-11-08 MED ORDER — ATORVASTATIN CALCIUM 40 MG PO TABS: 20.0000 mg | ORAL_TABLET | Freq: Every day | ORAL | 3 refills | Status: AC

## 2019-11-28 ENCOUNTER — Other Ambulatory Visit: Payer: Self-pay | Admitting: Family Medicine

## 2019-11-28 ENCOUNTER — Other Ambulatory Visit: Payer: Self-pay | Admitting: Osteopathic Medicine

## 2019-12-03 ENCOUNTER — Other Ambulatory Visit: Payer: Self-pay | Admitting: Osteopathic Medicine

## 2019-12-03 ENCOUNTER — Other Ambulatory Visit: Payer: Self-pay | Admitting: Medical-Surgical

## 2019-12-03 DIAGNOSIS — G894 Chronic pain syndrome: Secondary | ICD-10-CM

## 2019-12-03 NOTE — Telephone Encounter (Signed)
Pt had labs done. Wanted to inform someone, so that his oxyCODONE can get refilled. Was told he may need to wait until results are in.

## 2019-12-04 ENCOUNTER — Other Ambulatory Visit: Payer: Self-pay | Admitting: Family Medicine

## 2019-12-04 LAB — COMPLETE METABOLIC PANEL WITH GFR
AG Ratio: 1.1 (calc) (ref 1.0–2.5)
ALT: 15 U/L (ref 9–46)
AST: 20 U/L (ref 10–35)
Albumin: 3.7 g/dL (ref 3.6–5.1)
Alkaline phosphatase (APISO): 92 U/L (ref 35–144)
BUN/Creatinine Ratio: 23 (calc) — ABNORMAL HIGH (ref 6–22)
BUN: 32 mg/dL — ABNORMAL HIGH (ref 7–25)
CO2: 30 mmol/L (ref 20–32)
Calcium: 9.1 mg/dL (ref 8.6–10.3)
Chloride: 97 mmol/L — ABNORMAL LOW (ref 98–110)
Creat: 1.39 mg/dL — ABNORMAL HIGH (ref 0.70–1.33)
GFR, Est African American: 64 mL/min/{1.73_m2} (ref >=60)
GFR, Est Non African American: 55 mL/min/{1.73_m2} — ABNORMAL LOW (ref >=60)
Globulin: 3.5 g/dL (calc) (ref 1.9–3.7)
Glucose, Bld: 262 mg/dL — ABNORMAL HIGH (ref 65–99)
Potassium: 4.5 mmol/L (ref 3.5–5.3)
Sodium: 136 mmol/L (ref 135–146)
Total Bilirubin: 0.6 mg/dL (ref 0.2–1.2)
Total Protein: 7.2 g/dL (ref 6.1–8.1)

## 2019-12-04 NOTE — Telephone Encounter (Signed)
On review of PDMP, patient received 90 tabs on 11/06/2019 from [facility] [place] (pain management) and had another 90 tabs from Dr. Sheppard Coil sent on 11/07/2019 that was not picked up. Can we please call and verify if this prescription is at the pharmacy and/or if [facility] is supposed to be managing his pain medications?

## 2019-12-04 NOTE — Telephone Encounter (Signed)
Maintain prescription on hold at the pharmacy.  No further refills will be sent until PCP returns and can evaluate.

## 2019-12-04 NOTE — Addendum Note (Signed)
Addended by: Maryla Morrow on: 12/04/2019 07:00 PM   Modules accepted: Orders

## 2019-12-04 NOTE — Telephone Encounter (Signed)
Called patient he states that he stopped going to pain management because "they are just a scam". Patient states he discussed with Dr Sheppard Coil at last appointment that she was going to take over his pain medications. Dr Sheppard Coil did do a pain contract with this patient at his last appt (see media tab).  He did confirm he got his last RF from [facility] with pain management. I called their office to confirm they were no longer witting for this patient. Left msg with Cyril Mourning, her scribe, and asked for call back

## 2019-12-04 NOTE — Telephone Encounter (Signed)
Received call back from [facility] with Pain Specialist.   She states that patient is still actively being seen there. He was last seen by them on 10/23/19 and received an RX for percocet from [facility] on 4/6 and also one future dated for 5/6 was sent to [facility].  Patient signed pain contract here with our office on 4/7 and had RX sent in for Percocet on 4/7.  I called [facility] and had them place the RX on hold until I could discuss with provider.   Please advise.Marland KitchenMarland Kitchen

## 2019-12-04 NOTE — Telephone Encounter (Signed)
Patient needs to decide if he is seeing me or Pain Mgt - I can't see their records. He's due for refill on 12/06/19. PDMP reviewed - he filled Rx from [facility] on 11/06/19 and saw me the next day at which point I agreed to take over his medications from pain mgt.   Pended Rx for refill from me  Before filling this 1) patient needs to [place] that he is no longer planning to follow-up with pain management - he and I have already discussed I will ABSOLUTELY NOT be increasing his dose EVER under ANY circumstance, please obtaint his verbal confirmation of understanding  2) if pt agrees to abvove, Pharmacy needs to CANCEL all Rx for opiates that were NOT written by me 3) if patient disagrees, CANCEL ALL RX FROM ME he can see pain mgt then

## 2019-12-05 NOTE — Telephone Encounter (Signed)
Paint is being discharged from pain management (unsure if he knows this yet?) because he violated pain contract with [place] and with them.  Also, he should not be needing a refill because there are two at the pharmacy....   Please advise.

## 2019-12-06 NOTE — Addendum Note (Signed)
Addended by: Towana Badger on: 12/06/2019 02:22 PM   Modules accepted: Orders

## 2019-12-06 NOTE — Telephone Encounter (Signed)
Called and advised patient to have UDS done at lab downstairs and once they have collected the Urine, he is OK to get refill. Will check on this. I will call Krist[facility]en[facility] and release the RX as soon as sdample is received by [facility].   UDS ordered

## 2019-12-06 NOTE — Telephone Encounter (Signed)
OK to fill original rx by Sheppard Coil but must come in for UDS today ro tomorrow. Also let him know we will have to work on weaning his benzos over time since he is on chronic narcotics.

## 2019-12-07 ENCOUNTER — Ambulatory Visit: Payer: Self-pay | Admitting: Podiatry

## 2019-12-07 ENCOUNTER — Other Ambulatory Visit: Payer: Self-pay | Admitting: Medical-Surgical

## 2019-12-07 MED ORDER — LEVETIRACETAM 1000 MG PO TABS: 1000.0000 mg | ORAL_TABLET | Freq: Two times a day (BID) | ORAL | 0 refills | Status: DC

## 2019-12-24 ENCOUNTER — Other Ambulatory Visit: Payer: Self-pay

## 2019-12-24 ENCOUNTER — Ambulatory Visit (INDEPENDENT_AMBULATORY_CARE_PROVIDER_SITE_OTHER): Payer: Self-pay | Admitting: Osteopathic Medicine

## 2019-12-24 ENCOUNTER — Encounter: Payer: Self-pay | Admitting: Osteopathic Medicine

## 2019-12-24 VITALS — BP 132/88 | HR 80 | Temp 98.4°F | Wt 197.1 lb

## 2019-12-24 DIAGNOSIS — Z794 Long term (current) use of insulin: Secondary | ICD-10-CM

## 2019-12-24 DIAGNOSIS — E1140 Type 2 diabetes mellitus with diabetic neuropathy, unspecified: Secondary | ICD-10-CM

## 2019-12-24 LAB — POCT GLYCOSYLATED HEMOGLOBIN (HGB A1C)

## 2019-12-24 MED ORDER — TICAGRELOR 90 MG PO TABS: 90.0000 mg | ORAL_TABLET | Freq: Two times a day (BID) | ORAL | 3 refills | Status: DC

## 2019-12-24 MED ORDER — ONDANSETRON 8 MG PO TBDP: 8.0000 mg | ORAL_TABLET | Freq: Three times a day (TID) | ORAL | 3 refills | Status: DC | PRN

## 2019-12-24 MED ORDER — PROMETHAZINE HCL 12.5 MG PO TABS: ORAL_TABLET | ORAL | 0 refills | Status: DC

## 2019-12-24 NOTE — Progress Notes (Signed)
Seth Weeks is a 59 y.o. male who presents to  [place] [facility]  today, 12/24/19, seeking care for the following: . Diabetes     ASSESSMENT & PLAN with other pertinent history/findings:  The encounter diagnosis was Type 2 diabetes mellitus with diabetic neuropathy, with long-term current use of insulin ([place]).  Results for orders placed or performed in visit on 12/24/19 (from the past 72 hour(s))  POCT HgB A1C     Status: None   Collection Time: 12/24/19  2:31 PM  Result Value Ref Range   Hemoglobin A1C      Comment: abnormal >14.0%   HbA1c POC (<> result, manual entry)     HbA1c, POC (prediabetic range)     HbA1c, POC (controlled diabetic range)         Patient Instructions  Your A1C is too high for [place] to measure it. We need to increase the insulin.   We provided you with paperwork in January to get prescription assistance. I cannot help you if you do not fill these forms out. You were provided today with another set of forms.             Orders Placed This Encounter  Procedures  . POCT HgB A1C    Meds ordered this encounter  Medications  . ticagrelor (BRILINTA) 90 MG TABS tablet    Sig: Take 1 tablet (90 mg total) by mouth 2 (two) times daily.    Dispense:  180 tablet    Refill:  3  . ondansetron (ZOFRAN-ODT) 8 MG disintegrating tablet    Sig: Take 1 tablet (8 mg total) by mouth every 8 (eight) hours as needed (mild to moderate nausea).    Dispense:  20 tablet    Refill:  3  . promethazine (PHENERGAN) 12.5 MG tablet    Sig: Take one or two tabs PO Q6hr prn severe nausea    Dispense:  20 tablet    Refill:  0       Follow-up instructions: Return in about 3 months (around 03/25/2020) for amintain controlled substance Rx and monitor diabetes - see me sooner if needed! .                                         BP 132/88 (BP Location: Left Arm, Patient Position:  Sitting, Cuff Size: Normal)   Pulse 80   Temp 98.4 F (36.9 C) (Oral)   Wt 197 lb 1.9 oz (89.4 kg)   BMI 29.97 kg/m   Current Meds  Medication Sig  . ADVOCATE INSULIN PEN NEEDLES 33G X 4 MM MISC USE  TWICE DAILY  . AMBULATORY NON FORMULARY MEDICATION Single glucometer of choice with lancets, test strips. Test 3x daily.  E11.65 Disp QS 1 months  . aspirin EC 81 MG tablet Take 81 mg by mouth daily.   [name] atorvastatin (LIPITOR) 40 MG tablet Take 0.5 tablets (20 mg total) by mouth daily.  [name] buPROPion (WELLBUTRIN XL) 150 MG 24 hr tablet Take 1 tablet by mouth once daily in the morning (Patient taking differently: Take 150 mg by mouth daily. )  . Continuous Blood Gluc Sensor (FREESTYLE LIBRE 14 DAY SENSOR) MISC USE AS DIRECTED CHANGE  EVERY  14  DAYS  . furosemide (LASIX) 40 MG tablet TAKE ONE TABLET BY MOUTH DAILY  . Insulin Pen Needle (PEN NEEDLES)  31G X 8 MM MISC Use with lantus pen  . levETIRAcetam (KEPPRA) 1000 MG tablet Take 1 tablet (1,000 mg total) by mouth 2 (two) times daily.  . metoprolol succinate (TOPROL-XL) 25 MG 24 hr tablet Take 1 tablet (25 mg total) by mouth daily.  . Multiple Vitamin (MULTI-VITAMIN) tablet Take 1 tablet by mouth daily.   . nitroGLYCERIN (NITROSTAT) 0.4 MG SL tablet PLACE 1 TABLET (0.4 MG TOTAL) UNDER THE TONGUE EVERY 5 (FIVE) MINUTES AS NEEDED FOR CHEST PAIN.  [name] omeprazole (PRILOSEC) 40 MG capsule Take 1 capsule (40 mg total) by mouth daily.  . ondansetron (ZOFRAN) 8 MG tablet Take 0.5-1 tablets (4-8 mg total) by mouth every 8 (eight) hours as needed for nausea or vomiting.  . ondansetron (ZOFRAN-ODT) 8 MG disintegrating tablet Take 1 tablet (8 mg total) by mouth every 8 (eight) hours as needed (mild to moderate nausea).  . promethazine (PHENERGAN) 12.5 MG tablet Take one or two tabs PO Q6hr prn severe nausea  . sacubitril-valsartan (ENTRESTO) 24-26 MG Take 1 tablet by mouth in the morning and at bedtime.  . ticagrelor (BRILINTA) 90 MG TABS tablet Take 1  tablet (90 mg total) by mouth 2 (two) times daily.  . [DISCONTINUED] insulin glargine (LANTUS) 100 UNIT/ML injection Inject 0.4 mLs (40 Units total) into the skin at bedtime.  . [DISCONTINUED] ondansetron (ZOFRAN-ODT) 8 MG disintegrating tablet Take 1 tablet (8 mg total) by mouth every 8 (eight) hours as needed for nausea.  . [DISCONTINUED] promethazine (PHENERGAN) 12.5 MG tablet Take one or two tabs PO Q6hr prn nausea  . [DISCONTINUED] ticagrelor (BRILINTA) 90 MG TABS tablet Take by mouth.    Results for orders placed or performed in visit on 12/24/19 (from the past 72 hour(s))  POCT HgB A1C     Status: None   Collection Time: 12/24/19  2:31 PM  Result Value Ref Range   Hemoglobin A1C      Comment: abnormal >14.0%   HbA1c POC (<> result, manual entry)     HbA1c, POC (prediabetic range)     HbA1c, POC (controlled diabetic range)      No results found.  Depression screen [facility] 2/9 04/27/2019 11/24/2018 08/18/2018  Decreased Interest 3 3 2   Down, Depressed, Hopeless 3 2 3   PHQ - 2 Score 6 5 5   Altered sleeping 3 3 3   Tired, decreased energy 3 3 3   Change in appetite 3 1 1   Feeling bad or failure about yourself  3 3 3   Trouble concentrating 3 3 1   Moving slowly or fidgety/restless 3 2 0  Suicidal thoughts 3 2 1   PHQ-9 Score 27 22 17   Difficult doing work/chores Extremely dIfficult Very difficult Very difficult  Some recent data might be hidden    GAD 7 : Generalized Anxiety Score 09/26/2019 04/27/2019 11/24/2018 08/18/2018  Nervous, Anxious, on Edge 3 3 2 2   Control/stop worrying 3 3 2 3   Worry too much - different things 3 3 2 3   Trouble relaxing 3 3 3 2   Restless 1 1 2 1   Easily annoyed or irritable 3 3 2 1   Afraid - awful might happen 3 3 0 3  Total GAD 7 Score 19 19 13 15   Anxiety Difficulty Extremely difficult Extremely difficult Very difficult Very difficult      All questions at time of visit were answered - patient instructed to contact office with any additional  concerns or updates.  ER/RTC precautions were reviewed with the patient.  Please note:  voice recognition software was used to produce this document, and typos may escape review. Please contact Dr. Sheppard Coil for any needed clarifications.

## 2019-12-24 NOTE — Patient Instructions (Addendum)
Your A1C is too high for [place] to measure it. We need to increase the insulin.   We provided you with paperwork in January to get prescription assistance. I cannot help you if you do not fill these forms out. You were provided today with another set of forms.

## 2019-12-25 LAB — DRUG MONITORING, PANEL 6 WITH CONFIRMATION, URINE
6 Acetylmorphine: NEGATIVE ng/mL (ref <10)
Alcohol Metabolites: NEGATIVE ng/mL
Amphetamines: NEGATIVE ng/mL (ref <500)
Barbiturates: NEGATIVE ng/mL (ref <300)
Benzodiazepines: NEGATIVE ng/mL (ref <100)
Cocaine Metabolite: NEGATIVE ng/mL (ref <150)
Creatinine: 206.2 mg/dL
Marijuana Metabolite: NEGATIVE ng/mL (ref <20)
Methadone Metabolite: NEGATIVE ng/mL (ref <100)
Opiates: NEGATIVE ng/mL (ref <100)
Oxidant: NEGATIVE ug/mL
Oxycodone: NEGATIVE ng/mL (ref <100)
Phencyclidine: NEGATIVE ng/mL (ref <25)
pH: 5.3 (ref 4.5–9.0)

## 2019-12-25 LAB — DM TEMPLATE

## 2020-01-10 ENCOUNTER — Other Ambulatory Visit: Payer: Self-pay | Admitting: Family Medicine

## 2020-01-10 NOTE — Telephone Encounter (Signed)
Last RX sent 09/06/19 for #473 mL with 1 RF, directions to use 10 mLs QID  Okay for refill?

## 2020-01-11 ENCOUNTER — Telehealth: Payer: Self-pay

## 2020-01-11 NOTE — Telephone Encounter (Signed)
Seth Weeks states he would like the lower dose of the pain medication. He states this was offered during his last appointment. Please advise.

## 2020-01-14 NOTE — Telephone Encounter (Signed)
Patient advised.

## 2020-01-14 NOTE — Telephone Encounter (Signed)
Seth Weeks's recent UDS was negative for prescriptions and it was made clear to him that no further controlled substances of any kind (pain meds or Xanax) will be prescribed by me

## 2020-01-17 ENCOUNTER — Other Ambulatory Visit: Payer: Self-pay | Admitting: Osteopathic Medicine

## 2020-01-17 DIAGNOSIS — F32A Depression, unspecified: Secondary | ICD-10-CM

## 2020-01-25 ENCOUNTER — Ambulatory Visit: Payer: Self-pay | Admitting: Podiatry

## 2020-01-31 ENCOUNTER — Other Ambulatory Visit: Payer: Self-pay | Admitting: Family Medicine

## 2020-02-06 ENCOUNTER — Other Ambulatory Visit: Payer: Self-pay | Admitting: Osteopathic Medicine

## 2020-03-25 ENCOUNTER — Ambulatory Visit: Payer: Self-pay | Admitting: Osteopathic Medicine

## 2020-03-27 ENCOUNTER — Other Ambulatory Visit: Payer: Self-pay | Admitting: Family Medicine

## 2020-03-27 DIAGNOSIS — E162 Hypoglycemia, unspecified: Secondary | ICD-10-CM

## 2020-03-27 DIAGNOSIS — Z794 Long term (current) use of insulin: Secondary | ICD-10-CM

## 2020-03-27 DIAGNOSIS — E1140 Type 2 diabetes mellitus with diabetic neuropathy, unspecified: Secondary | ICD-10-CM

## 2020-04-12 ENCOUNTER — Other Ambulatory Visit: Payer: Self-pay | Admitting: Osteopathic Medicine

## 2020-04-12 DIAGNOSIS — Z794 Long term (current) use of insulin: Secondary | ICD-10-CM

## 2020-04-12 DIAGNOSIS — E1140 Type 2 diabetes mellitus with diabetic neuropathy, unspecified: Secondary | ICD-10-CM

## 2020-04-12 DIAGNOSIS — E162 Hypoglycemia, unspecified: Secondary | ICD-10-CM

## 2020-04-18 ENCOUNTER — Ambulatory Visit: Payer: Self-pay | Admitting: Podiatry

## 2020-05-05 ENCOUNTER — Ambulatory Visit: Payer: Self-pay | Admitting: Osteopathic Medicine

## 2020-05-10 ENCOUNTER — Other Ambulatory Visit: Payer: Self-pay | Admitting: Osteopathic Medicine

## 2020-05-10 DIAGNOSIS — E1140 Type 2 diabetes mellitus with diabetic neuropathy, unspecified: Secondary | ICD-10-CM

## 2020-05-10 DIAGNOSIS — E162 Hypoglycemia, unspecified: Secondary | ICD-10-CM

## 2020-05-21 ENCOUNTER — Ambulatory Visit: Payer: Self-pay | Admitting: Osteopathic Medicine

## 2020-06-12 ENCOUNTER — Ambulatory Visit (INDEPENDENT_AMBULATORY_CARE_PROVIDER_SITE_OTHER): Payer: Self-pay | Admitting: Osteopathic Medicine

## 2020-06-12 ENCOUNTER — Other Ambulatory Visit: Payer: Self-pay

## 2020-06-12 ENCOUNTER — Encounter: Payer: Self-pay | Admitting: Osteopathic Medicine

## 2020-06-12 VITALS — BP 126/80 | HR 109 | Temp 97.7°F | Wt 195.1 lb

## 2020-06-12 DIAGNOSIS — E1159 Type 2 diabetes mellitus with other circulatory complications: Secondary | ICD-10-CM

## 2020-06-12 DIAGNOSIS — E1140 Type 2 diabetes mellitus with diabetic neuropathy, unspecified: Secondary | ICD-10-CM

## 2020-06-12 DIAGNOSIS — R569 Unspecified convulsions: Secondary | ICD-10-CM

## 2020-06-12 DIAGNOSIS — I639 Cerebral infarction, unspecified: Secondary | ICD-10-CM

## 2020-06-12 DIAGNOSIS — M654 Radial styloid tenosynovitis [de Quervain]: Secondary | ICD-10-CM

## 2020-06-12 DIAGNOSIS — Z794 Long term (current) use of insulin: Secondary | ICD-10-CM

## 2020-06-12 MED ORDER — CHLORHEXIDINE GLUCONATE 0.12 % MT SOLN: 10.0000 mL | Freq: Two times a day (BID) | OROMUCOSAL | 3 refills | Status: DC

## 2020-06-12 MED ORDER — LEVETIRACETAM 500 MG PO TABS
500.0000 mg | ORAL_TABLET | Freq: Two times a day (BID) | ORAL | 0 refills | Status: DC
Start: 2020-06-12 — End: 2020-09-08

## 2020-06-12 MED ORDER — ONDANSETRON HCL 8 MG PO TABS
4.0000 mg | ORAL_TABLET | Freq: Three times a day (TID) | ORAL | 3 refills | Status: DC | PRN
Start: 2020-06-12 — End: 2020-08-25

## 2020-06-12 MED ORDER — "INSULIN SYRINGE-NEEDLE U-100 26G X 1/2"" 1 ML MISC": 3 refills | Status: AC

## 2020-06-12 MED ORDER — BUPROPION HCL ER (XL) 150 MG PO TB24: ORAL_TABLET | ORAL | 3 refills | Status: AC

## 2020-06-12 MED ORDER — ASPIRIN EC 325 MG PO TBEC: 325.0000 mg | DELAYED_RELEASE_TABLET | Freq: Every day | ORAL | 3 refills | Status: AC

## 2020-06-12 MED ORDER — INSULIN NPH ISOPHANE & REGULAR (70-30) 100 UNIT/ML ~~LOC~~ SUSP: SUBCUTANEOUS | 5 refills | Status: AC

## 2020-06-12 MED ORDER — DICLOFENAC SODIUM 1 % EX GEL: 4.0000 g | Freq: Four times a day (QID) | CUTANEOUS | 11 refills | Status: AC

## 2020-06-12 MED ORDER — PROMETHAZINE HCL 12.5 MG PO TABS: ORAL_TABLET | ORAL | 3 refills | Status: AC

## 2020-06-12 NOTE — Patient Instructions (Addendum)
Arm pain: consistent with DeQuervain's Tenosynovitis. Option to see Dr. Darene Lamer our sports medicine doctor if the brace is not helpful over the next few weeks. I sent Voltaren gel.   Diabetes: See prescription for Novolin 70/30  History of seizure: neurologist recommended continued anti-seizure treatment. I refilled this for you. DMV may require follow-up with neurology prior to clearance  Please consult [email or web address] for medication prices

## 2020-06-12 NOTE — Progress Notes (Signed)
HPI: Seth Weeks is a 59 y.o. male who  has a past medical history of CHF (congestive heart failure) ([place]) (04/11/2018), Diabetes mellitus ([place]), GAD (generalized anxiety disorder) (07/17/2015), High triglycerides, Hypertension, and MDD (major depressive disorder) (03/17/2018).  he presents to [facility] today, 06/12/20,  for chief complaint of: ER follow-up for R arm pain  Diabetes type 2 check   Arm pain: Right arm pain was first noted by patient on November 7th when when he went to the ED. It was 9/10 pain at that point. ED determined most likely to be De Quervian's tenosynovitis.  He was given a splint at the ED and has worn it since. Pain is now 5-6/10.  The pain is described as a sharp burning sensation on the radial aspect of the right wrist with radiation to mid forearm. Pain is worse with movement of thumb and wrist.   DM2:  He has not been able to consistently obtain insulin due to lack of insurance.  He is planning on starting a wellness program for exercise. He recently was able to obtain some over the counter novolin R insulin and will likely have insurance after the first of the coming year and will hopefully be able to afford insulin at that point. Not taking Novolin, wasn't sure how to dose this   Hx seizure: Has DMV forms to fill out re: seizure 10/2019, Records reviewed, neurology seemed more concerned for Glc issues, but EMS did note some clonic activity. Neuro adivsed continue Keppra pt has not seen them since d/t financial barriers to care     Past medical, surgical, social and family history reviewed:  Patient Active Problem List   Diagnosis Date Noted  . Controlled substance agreement signed 11/08/2019  . Seizure ([place]) 10/18/2019  . DM (diabetes mellitus), type 1, uncontrolled ([place]) 10/18/2019  . CVA (cerebral vascular accident) ([place]) 10/17/2019  . Acute respiratory failure with hypoxia ([place]) 07/26/2019  . Acute kidney failure  ([place]) 07/24/2019  . Elevated troponin 07/24/2019  . Hyperlipidemia 03/16/2019  . Onychomycosis of multiple toenails with type 2 diabetes mellitus ([place]) 11/30/2018  . Hypertension associated with diabetes ([place]) 11/24/2018  . Coronary artery disease involving native coronary artery of native heart without angina pectoris 04/14/2018  . Chronic systolic congestive heart failure ([place]) 04/11/2018  . Nonischemic cardiomyopathy ([place]) 04/11/2018  . Pericardial effusion 04/11/2018  . Tachycardia 04/11/2018  . MDD (major depressive disorder) 03/17/2018  . Metatarsalgia 03/17/2018  . Carpal tunnel syndrome 03/17/2018  . Peripheral neuropathy 06/16/2017  . Type 2 diabetes mellitus with diabetic neuropathy, unspecified ([place]) 06/16/2017  . Chronic pain 04/26/2017  . Bilateral tennis elbow 03/22/2017  . Impingement syndrome of right shoulder region 06/28/2016  . Skin lesion 03/03/2016  . GAD (generalized anxiety disorder) 07/17/2015  . Pes cavus 01/28/2015  . Umbilical hernia 40/34/7425  . Benign hypertension with coincident congestive heart failure ([place]) 10/19/2013  . BPV (benign positional vertigo) 07/23/2013  . Type 2 diabetes mellitus with peripheral neuropathy ([place]) 05/25/2013  . Erectile dysfunction 05/25/2013    Past Surgical History:  Procedure Laterality Date  . TONSILLECTOMY      Social History   Tobacco Use  . Smoking status: Never Smoker  . Smokeless tobacco: Never Used  Substance Use Topics  . Alcohol use: Yes    Family History  Problem Relation Age of Onset  . Heart failure Mother      Current medication list and allergy/intolerance information reviewed:    Current Outpatient  Medications  Medication Sig Dispense Refill  . ADVOCATE INSULIN PEN NEEDLES 33G X 4 MM MISC USE  TWICE DAILY 100 each 0  . AMBULATORY NON FORMULARY MEDICATION Single glucometer of choice with lancets, test strips. Test 3x daily.  E11.65 Disp QS 1 months 1 each 0  . atorvastatin (LIPITOR) 40  MG tablet Take 0.5 tablets (20 mg total) by mouth daily. 90 tablet 3  . buPROPion (WELLBUTRIN XL) 150 MG 24 hr tablet Take 1 tablet by mouth once daily in the morning 90 tablet 3  . chlorhexidine (PERIDEX) 0.12 % solution Use as directed 10 mLs in the mouth or throat 2 (two) times daily. 473 mL 3  . Continuous Blood Gluc Sensor (FREESTYLE LIBRE 14 DAY SENSOR) MISC USE AS DIRECTED CHANGE EVERY 14 DAYS. 10 each 0  . furosemide (LASIX) 40 MG tablet TAKE ONE TABLET BY MOUTH DAILY 90 tablet 0  . levETIRAcetam (KEPPRA) 500 MG tablet Take 1 tablet (500 mg total) by mouth 2 (two) times daily. 180 tablet 0  . metoprolol succinate (TOPROL-XL) 25 MG 24 hr tablet Take 1 tablet (25 mg total) by mouth daily. 90 tablet 3  . Multiple Vitamin (MULTI-VITAMIN) tablet Take 1 tablet by mouth daily.     . nitroGLYCERIN (NITROSTAT) 0.4 MG SL tablet PLACE 1 TABLET (0.4 MG TOTAL) UNDER THE TONGUE EVERY 5 (FIVE) MINUTES AS NEEDED FOR CHEST PAIN. 300 tablet 1  . omeprazole (PRILOSEC) 40 MG capsule Take 1 capsule (40 mg total) by mouth daily. 90 capsule 3  . ondansetron (ZOFRAN) 8 MG tablet Take 0.5-1 tablets (4-8 mg total) by mouth every 8 (eight) hours as needed for nausea or vomiting. 30 tablet 3  . promethazine (PHENERGAN) 12.5 MG tablet Take one or two tabs PO Q6hr prn severe nausea 20 tablet 3  . sacubitril-valsartan (ENTRESTO) 24-26 MG Take 1 tablet by mouth in the morning and at bedtime.    [name] aspirin EC 325 MG tablet Take 1 tablet (325 mg total) by mouth daily. 90 tablet 3  . diclofenac Sodium (VOLTAREN) 1 % GEL Apply 4 g topically 4 (four) times daily. To affected joint. 150 g 11  . insulin NPH-regular Human (70-30) 100 UNIT/ML injection 15 units AM, 5 units PM for 2 days, then 20 units AM and 10 units PM for 7 days. Measure fasting glucose levels daily. Will call you in one week to review sugars and decide on dose from there. 30 mL 5  . Insulin Pen Needle (PEN NEEDLES) 31G X 8 MM MISC Use with lantus pen (Patient not  taking: Reported on 06/12/2020) 100 each 1  . Insulin Syringe-Needle U-100 26G X 1/2" 1 ML MISC Use as directed for insulin administration subcutaneously 100 each 3   No current facility-administered medications for this visit.    Allergies  Allergen Reactions  . Bee Venom Anaphylaxis  . Clindamycin/Lincomycin Diarrhea    Bad diarrhea. Please do not give.   . Pregabalin Other (See Comments)    expensive    . Penicillins   . Terramycin [Oxytetracycline]       Review of Systems:  Cardiac: increased bilateral lower leg edema   Gastrointestinal: No  abdominal pain, +  nausea, +  vomiting   Musculoskeletal: + new pain in r wrist and arm   Neurologic: No  weakness  Psychiatric: No  concerns with depression, No  concerns with anxiety, No sleep problems, No mood problems  Exam:  BP 126/80 (BP Location: Left Arm, Patient Position: Sitting,  Cuff Size: Normal)   Pulse (!) 109   Temp 97.7 F (36.5 C) (Oral)   Wt 195 lb 1.9 oz (88.5 kg)   BMI 29.67 kg/m   Constitutional: VS see above. General Appearance: alert, well-developed, well-nourished, NAD  Respiratory: Normal respiratory effort. no wheeze, no rhonchi, no rales  Cardiovascular: S1/S2 normal, no murmur, no rub/gallop auscultated. RRR.   Gastrointestinal: Nontender, no masses.  Musculoskeletal: Gait normal. + Finkelstein test R hand  Neurological: Normal balance/coordination. No tremor  Skin: warm, dry, intact.  Psychiatric: Normal judgment/insight. Normal mood and affect.    No results found for this or any previous visit (from the past 72 hour(s)).  No results found.   ASSESSMENT/PLAN: The primary encounter diagnosis was Type 2 diabetes mellitus with diabetic neuropathy, with long-term current use of insulin ([place]). Diagnoses of Type 2 diabetes mellitus with other circulatory complication, with long-term current use of insulin ([place]), Cerebrovascular accident (CVA), unspecified mechanism ([place]), Seizure ([place]),  and Tenosynovitis, de Quervain were also pertinent to this visit.   DM2   OK to use up the Novolin R at 10-15 units at mealtimes, after that...  Start on insulin R/NPH 70-30   Start 15 units in AM prior to breakfast and 5 units in PM prior to dinner for 2 days   Then 20 units in AM and 10 units in PM for 7 days   Will check in one week to see how blood sugars are doing   A1C in 3 months     GAD  Refill wellbutrin     History of seizure   Refill keppra per neurology recommendations based on last notes available   May need neuro reevaluation prior to clearance to drive    R wrist pain d/t DeQuervain's   Continue to wear wrist splint   Patient decline corticosteroid injections - consider consult w/ Dr T if no better   Will contact via mychart to touch base in 2 weeks  Follow up if symptoms do not improve or worsen   Rx for volteran gel      No orders of the defined types were placed in this encounter.   Meds ordered this encounter  Medications  . insulin NPH-regular Human (70-30) 100 UNIT/ML injection    Sig: 15 units AM, 5 units PM for 2 days, then 20 units AM and 10 units PM for 7 days. Measure fasting glucose levels daily. Will call you in one week to review sugars and decide on dose from there.    Dispense:  30 mL    Refill:  5  . Insulin Syringe-Needle U-100 26G X 1/2" 1 ML MISC    Sig: Use as directed for insulin administration subcutaneously    Dispense:  100 each    Refill:  3  . buPROPion (WELLBUTRIN XL) 150 MG 24 hr tablet    Sig: Take 1 tablet by mouth once daily in the morning    Dispense:  90 tablet    Refill:  3  . levETIRAcetam (KEPPRA) 500 MG tablet    Sig: Take 1 tablet (500 mg total) by mouth 2 (two) times daily.    Dispense:  180 tablet    Refill:  0  . chlorhexidine (PERIDEX) 0.12 % solution    Sig: Use as directed 10 mLs in the mouth or throat 2 (two) times daily.    Dispense:  473 mL    Refill:  3  . ondansetron (ZOFRAN) 8 MG  tablet  Sig: Take 0.5-1 tablets (4-8 mg total) by mouth every 8 (eight) hours as needed for nausea or vomiting.    Dispense:  30 tablet    Refill:  3  . promethazine (PHENERGAN) 12.5 MG tablet    Sig: Take one or two tabs PO Q6hr prn severe nausea    Dispense:  20 tablet    Refill:  3  . aspirin EC 325 MG tablet    Sig: Take 1 tablet (325 mg total) by mouth daily.    Dispense:  90 tablet    Refill:  3  . diclofenac Sodium (VOLTAREN) 1 % GEL    Sig: Apply 4 g topically 4 (four) times daily. To affected joint.    Dispense:  150 g    Refill:  11    Patient Instructions  Arm pain: consistent with DeQuervain's Tenosynovitis. Option to see Dr. Darene Lamer our sports medicine doctor if the brace is not helpful over the next few weeks. I sent Voltaren gel.   Diabetes: See prescription for Novolin 70/30  History of seizure: neurologist recommended continued anti-seizure treatment. I refilled this for you. DMV may require follow-up with neurology prior to clearance  Please consult [email or web address] for medication prices         Visit summary with medication list and pertinent instructions was printed for patient to review. All questions at time of visit were answered - patient instructed to contact office with any additional concerns or updates. ER/RTC precautions were reviewed with the patient.       Follow-up plan: Return in about 3 months (around 09/12/2020) for IN-OFFICE VISIT CHECK A1C .

## 2020-06-19 ENCOUNTER — Telehealth: Payer: Self-pay | Admitting: Osteopathic Medicine

## 2020-06-19 NOTE — Telephone Encounter (Signed)
Please call patient:  filled out DMV forms He has to complete one page and then we can fax the rest   Please copy to scan forms before giving back to patient  Form is in Vanicia's basket

## 2020-06-20 NOTE — Telephone Encounter (Signed)
Task completed. Pt informed that he needs to complete his portion of the form. Copy of form sent to scan folder. Original form available at the front desk. Pt will attempt to stop by the clinic today or Monday.

## 2020-07-08 ENCOUNTER — Ambulatory Visit: Payer: Self-pay | Admitting: Osteopathic Medicine

## 2020-07-28 ENCOUNTER — Other Ambulatory Visit: Payer: Self-pay | Admitting: Osteopathic Medicine

## 2020-08-19 ENCOUNTER — Other Ambulatory Visit: Payer: Self-pay | Admitting: Osteopathic Medicine

## 2020-08-25 ENCOUNTER — Telehealth: Payer: Self-pay

## 2020-08-25 MED ORDER — ONDANSETRON 8 MG PO TBDP: 8.0000 mg | ORAL_TABLET | Freq: Three times a day (TID) | ORAL | 3 refills | Status: AC | PRN

## 2020-08-25 NOTE — Telephone Encounter (Signed)
ODT format sent to pharmacy.

## 2020-08-25 NOTE — Telephone Encounter (Signed)
Routing to covering provider.   [facility] pharmacy requesting a change in rx. Per pt, would like to switch from zofran 8 mg tablets to 8 mg ODT.

## 2020-09-08 ENCOUNTER — Other Ambulatory Visit: Payer: Self-pay | Admitting: Osteopathic Medicine

## 2020-09-15 ENCOUNTER — Ambulatory Visit: Payer: Self-pay | Admitting: Osteopathic Medicine

## 2020-09-18 ENCOUNTER — Telehealth: Payer: Self-pay

## 2020-09-18 NOTE — Telephone Encounter (Signed)
Transition Care Management Unsuccessful Follow-up Telephone Call  Date of discharge and from where:  09/17/2020 from [facility]  Attempts:  1st Attempt  Reason for unsuccessful TCM follow-up call:  Left voice message    Pt has an appt tomorrow with current PCP.

## 2020-09-19 ENCOUNTER — Inpatient Hospital Stay: Payer: Self-pay | Admitting: Osteopathic Medicine

## 2020-09-19 NOTE — Telephone Encounter (Signed)
Transition Care Management Unsuccessful Follow-up Telephone Call  Date of discharge and from where:  09/17/2020 from [facility]  Attempts:  2nd Attempt  Reason for unsuccessful TCM follow-up call:  Left voice message

## 2020-09-22 ENCOUNTER — Inpatient Hospital Stay: Payer: Self-pay | Admitting: Osteopathic Medicine

## 2020-09-25 ENCOUNTER — Other Ambulatory Visit: Payer: Self-pay | Admitting: Physician Assistant

## 2020-09-25 ENCOUNTER — Other Ambulatory Visit: Payer: Self-pay

## 2020-09-25 DIAGNOSIS — E1140 Type 2 diabetes mellitus with diabetic neuropathy, unspecified: Secondary | ICD-10-CM

## 2020-09-25 DIAGNOSIS — E162 Hypoglycemia, unspecified: Secondary | ICD-10-CM

## 2020-09-25 NOTE — Telephone Encounter (Signed)
Routing to covering provider.   Pt called requesting a med refill for continuous blood glucose sensor. Rx written by external provider. Pt has been unable to reach the office and has run out of sensors. Rx pended.

## 2020-09-26 ENCOUNTER — Other Ambulatory Visit: Payer: Self-pay | Admitting: Osteopathic Medicine

## 2020-09-26 MED ORDER — FREESTYLE LIBRE 14 DAY SENSOR MISC: 0 refills | Status: AC

## 2020-09-26 NOTE — Telephone Encounter (Signed)
Task completed. Left a detailed vm msg for patient regarding med refill for sensors. Direct call back info provided.

## 2020-10-20 ENCOUNTER — Inpatient Hospital Stay: Payer: Self-pay | Admitting: Osteopathic Medicine

## 2021-02-23 ENCOUNTER — Telehealth: Payer: Self-pay

## 2021-02-23 NOTE — Telephone Encounter (Signed)
Transition Care Management Unsuccessful Follow-up Telephone Call  Date of discharge and from where:  02/19/2021 from [facility]  Attempts:  1st Attempt  Reason for unsuccessful TCM follow-up call:  Unable to leave message

## 2021-02-24 NOTE — Telephone Encounter (Signed)
Transition Care Management Unsuccessful Follow-up Telephone Call  Date of discharge and from where:  02/19/2021 from [facility]  Attempts:  2nd Attempt  Reason for unsuccessful TCM follow-up call:  Unable to leave message

## 2021-02-25 NOTE — Telephone Encounter (Signed)
Transition Care Management Unsuccessful Follow-up Telephone Call  Date of discharge and from where:  02/19/21 from [facility]  Attempts:  3rd Attempt  Reason for unsuccessful TCM follow-up call:  Unable to leave message

## 2023-11-15 ENCOUNTER — Emergency Department (HOSPITAL_COMMUNITY)
Admission: EM | Admit: 2023-11-15 | Discharge: 2023-11-15 | Disposition: A | Discharge status: Skilled Nursing Facility | Payer: Self-pay | Attending: Emergency Medicine | Admitting: Emergency Medicine

## 2023-11-15 ENCOUNTER — Other Ambulatory Visit: Payer: Self-pay

## 2023-11-15 ENCOUNTER — Encounter (HOSPITAL_COMMUNITY): Payer: Self-pay

## 2023-11-15 DIAGNOSIS — R4020 Unspecified coma: Secondary | ICD-10-CM

## 2023-11-15 DIAGNOSIS — Z794 Long term (current) use of insulin: Secondary | ICD-10-CM | POA: Insufficient documentation

## 2023-11-15 DIAGNOSIS — R55 Syncope and collapse: Secondary | ICD-10-CM | POA: Insufficient documentation

## 2023-11-15 DIAGNOSIS — E162 Hypoglycemia, unspecified: Secondary | ICD-10-CM | POA: Insufficient documentation

## 2023-11-15 DIAGNOSIS — Z7982 Long term (current) use of aspirin: Secondary | ICD-10-CM | POA: Insufficient documentation

## 2023-11-15 LAB — COMPREHENSIVE METABOLIC PANEL WITH GFR
ALT: 16 U/L (ref 0–44)
AST: 22 U/L (ref 15–41)
Albumin: 3.2 g/dL — ABNORMAL LOW (ref 3.5–5.0)
Alkaline Phosphatase: 68 U/L (ref 38–126)
Anion gap: 10 (ref 5–15)
BUN: 22 mg/dL (ref 8–23)
CO2: 24 mmol/L (ref 22–32)
Calcium: 9 mg/dL (ref 8.9–10.3)
Chloride: 104 mmol/L (ref 98–111)
Creatinine, Ser: 1.11 mg/dL (ref 0.61–1.24)
GFR, Estimated: >60 mL/min (ref >60)
Glucose, Bld: 64 mg/dL — ABNORMAL LOW (ref 70–99)
Potassium: 3.7 mmol/L (ref 3.5–5.1)
Sodium: 138 mmol/L (ref 135–145)
Total Bilirubin: 0.6 mg/dL (ref 0.0–1.2)
Total Protein: 7.4 g/dL (ref 6.5–8.1)

## 2023-11-15 LAB — CBC WITH DIFFERENTIAL/PLATELET
Abs Immature Granulocytes: 0.03 10*3/uL (ref 0.00–0.07)
Basophils Absolute: 0.1 10*3/uL (ref 0.0–0.1)
Basophils Relative: 1 %
Eosinophils Absolute: 0.1 10*3/uL (ref 0.0–0.5)
Eosinophils Relative: 1 %
HCT: 36.6 % — ABNORMAL LOW (ref 39.0–52.0)
Hemoglobin: 12.1 g/dL — ABNORMAL LOW (ref 13.0–17.0)
Immature Granulocytes: 0 %
Lymphocytes Relative: 8 %
Lymphs Abs: 0.8 10*3/uL (ref 0.7–4.0)
MCH: 27.7 pg (ref 26.0–34.0)
MCHC: 33.1 g/dL (ref 30.0–36.0)
MCV: 83.8 fL (ref 80.0–100.0)
Monocytes Absolute: 0.9 10*3/uL (ref 0.1–1.0)
Monocytes Relative: 9 %
Neutro Abs: 8.1 10*3/uL — ABNORMAL HIGH (ref 1.7–7.7)
Neutrophils Relative %: 81 %
Platelets: 239 10*3/uL (ref 150–400)
RBC: 4.37 MIL/uL (ref 4.22–5.81)
RDW: 17.1 % — ABNORMAL HIGH (ref 11.5–15.5)
WBC: 10 10*3/uL (ref 4.0–10.5)
nRBC: 0 % (ref 0.0–0.2)

## 2023-11-15 LAB — CBG MONITORING, ED
Glucose-Capillary: 66 mg/dL — ABNORMAL LOW (ref 70–99)
Glucose-Capillary: 93 mg/dL (ref 70–99)
Glucose-Capillary: 177 mg/dL — ABNORMAL HIGH (ref 70–99)

## 2023-11-15 MED ORDER — SODIUM CHLORIDE 0.9 % IV BOLUS
1000.0000 mL | Freq: Once | INTRAVENOUS | Status: AC
  Administered 2023-11-15: 1000 mL via INTRAVENOUS

## 2023-11-15 NOTE — ED Provider Notes (Signed)
 8:28 PM Assumed care of patient from off-going team. For more details, please see note from same day.  In brief, this is a 63 y.o. male who passed out today after not eating lunch at facility. Glucose in 60s, was really hungry.   Plan/Dispo at time of sign-out & ED Course since sign-out: [ ]  CMP, one more glucose  BP 118/76 (BP Location: Left Arm)   Pulse 88   Temp 97.8 F (36.6 C) (Oral)   Resp 18   Ht 5\' 8"  (1.727 m)   Wt 59.9 kg   SpO2 100%   BMI 20.07 kg/m    ED Course:   Clinical Course as of 11/15/23 2028  Tue Nov 15, 2023  1550 Comprehensive metabolic panel(!) Unremarkable other than mild hypoglycemia - rechecking sugar after food. [HN]    Clinical Course User Index [HN] Merdis Stalling, MD    Patient with improved blood glucose to 177 on recheck.  Patient states he can check his sugar when he gets home and will continue to eat.  States that sugar dropped low because he was not able to eat lunch on time.  Patient is instructed to stay well-hydrated, instructed to follow-up with his PCP within 1 week.  Given discharge instructions and return precautions, all questions answered to patient satisfaction.  ------------------------------- Annita Kindle, MD Emergency Medicine  This note was created using dictation software, which may contain spelling or grammatical errors.   Merdis Stalling, MD 11/15/23 2029

## 2023-11-15 NOTE — Discharge Instructions (Addendum)
 Thank you for coming to [facility] Emergency Department. You were seen for likely passing out. We did an exam, labs, and these showed low blood sugar.  Please check your sugar at least 1-2 more times tonight and eat a good dinner.  Please also stay well-hydrated.  Please follow-up with your primary care physician within 1 to 2 weeks.  Do not hesitate to return to the ED or call 911 if you experience: -Worsening symptoms -Lightheadedness, passing out -Fevers/chills -Anything else that concerns you

## 2023-11-15 NOTE — ED Notes (Signed)
 [facility] called, eta "not too terribly long" (2nd in line for [facility])

## 2023-11-15 NOTE — ED Triage Notes (Signed)
 Pt to ED via [facility] from [facility]. EMS called out d/t pt having seizure like activity. Pt has had a cough, was not post-ictal on EMS arrival. Pt A&Ox4 on arrival, ambulatory to stretcher, pt has a non-productive cough, states he does not feel well. Pt states this started when they woke him up to come to ED.   90 98% 118/62 Cbg 148

## 2023-11-15 NOTE — ED Provider Notes (Addendum)
 [place] EMERGENCY DEPARTMENT AT [facility] Provider Note   CSN: [id number] Arrival date & time: 11/15/23  1332     History  Chief Complaint  Patient presents with   Seizures    Seth Weeks is a 63 y.o. male.  63 yo M with a chief complaints of an event where he lost consciousness.  Reportedly there was some concern that he had had a seizure.  Patient without any reported postictal period.  Brought in by EMS.  Patient with reported no complaints.  To me the patient tells me that he is quite hungry and would like something to eat.  He is not sure that he ate lunch today.  Otherwise feels he has been eating and drinking normally.  Denies nausea vomiting or diarrhea.  Denies cough congestion or fever.  Denies chest pain.   Seizures      Home Medications Prior to Admission medications   Medication Sig Start Date End Date Taking? Authorizing Provider  ADVOCATE INSULIN PEN NEEDLES 33G X 4 MM MISC USE TWICE DAILY AS DIRECTED 09/08/20   Alexander, Natalie, DO  chlorhexidine (PERIDEX) 0.12 % solution RINSE MOUTH OR THROAT WITH 10 ML AS DIRECTED TWICE DAILY 09/08/20   Alexander, Natalie, DO  AMBULATORY NON FORMULARY MEDICATION Single glucometer of choice with lancets, test strips. Test 3x daily.  E11.65 Disp QS 1 months 05/20/17   Corey, Evan S, MD  aspirin EC 325 MG tablet Take 1 tablet (325 mg total) by mouth daily. 06/12/20   Alexander, Natalie, DO  atorvastatin (LIPITOR) 40 MG tablet Take 0.5 tablets (20 mg total) by mouth daily. 11/08/19   Alexander, Natalie, DO  buPROPion (WELLBUTRIN XL) 150 MG 24 hr tablet Take 1 tablet by mouth once daily in the morning 06/12/20   Alexander, Natalie, DO  Continuous Blood Gluc Sensor (FREESTYLE LIBRE 14 DAY SENSOR) MISC USE AS DIRECTED CHANGE EVERY 14 DAYS. 09/26/20   Cydney Draft, MD  diclofenac Sodium (VOLTAREN) 1 % GEL Apply 4 g topically 4 (four) times daily. To affected joint. 06/12/20   Alexander, Natalie, DO  furosemide  (LASIX) 40 MG tablet TAKE ONE TABLET BY MOUTH DAILY 02/06/20   Alexander, Natalie, DO  insulin NPH-regular Human (70-30) 100 UNIT/ML injection 15 units AM, 5 units PM for 2 days, then 20 units AM and 10 units PM for 7 days. Measure fasting glucose levels daily. Will call you in one week to review sugars and decide on dose from there. 06/12/20   Alexander, Natalie, DO  Insulin Pen Needle (PEN NEEDLES) 31G X 8 MM MISC Use with lantus pen Patient not taking: Reported on 06/12/2020 10/19/19   Swayze, Ava, DO  Insulin Syringe-Needle U-100 26G X 1/2" 1 ML MISC Use as directed for insulin administration subcutaneously 06/12/20   Alexander, Natalie, DO  levETIRAcetam (KEPPRA) 500 MG tablet Take 1 tablet by mouth twice daily 09/29/20   Alexander, Natalie, DO  metoprolol succinate (TOPROL-XL) 25 MG 24 hr tablet Take 1 tablet (25 mg total) by mouth daily. 11/07/19   Alexander, Natalie, DO  Multiple Vitamin (MULTI-VITAMIN) tablet Take 1 tablet by mouth daily.     [provider]  nitroGLYCERIN (NITROSTAT) 0.4 MG SL tablet PLACE 1 TABLET (0.4 MG TOTAL) UNDER THE TONGUE EVERY 5 (FIVE) MINUTES AS NEEDED FOR CHEST PAIN. 06/12/19   Gean Keels, MD  omeprazole (PRILOSEC) 40 MG capsule Take 1 capsule (40 mg total) by mouth daily. 03/16/19   Corey, Evan S, MD  ondansetron (ZOFRAN-ODT)  8 MG disintegrating tablet Take 1 tablet (8 mg total) by mouth every 8 (eight) hours as needed for nausea. 08/25/20   Christen Butter, NP  promethazine (PHENERGAN) 12.5 MG tablet Take one or two tabs PO Q6hr prn severe nausea 06/12/20   Sunnie Nielsen, DO  sacubitril-valsartan (ENTRESTO) 24-26 MG Take 1 tablet by mouth in the morning and at bedtime. 10/16/19   [provider]      Allergies    Bee venom, Clindamycin/lincomycin, Pregabalin, Penicillins, and Terramycin [oxytetracycline]    Review of Systems   Review of Systems  Neurological:  Positive for seizures.    Physical Exam Updated Vital Signs BP 106/65  (BP Location: Left Arm)   Pulse 87   Temp 98.1 F (36.7 C) (Oral)   Resp 16   Ht 5\' 8"  (1.727 m)   Wt 59.9 kg   BMI 20.07 kg/m  Physical Exam Vitals and nursing note reviewed.  Constitutional:      Appearance: He is well-developed.  HENT:     Head: Normocephalic and atraumatic.  Eyes:     Pupils: Pupils are equal, round, and reactive to light.  Neck:     Vascular: No JVD.  Cardiovascular:     Rate and Rhythm: Normal rate and regular rhythm.     Heart sounds: No murmur heard.    No friction rub. No gallop.  Pulmonary:     Effort: No respiratory distress.     Breath sounds: No wheezing.  Abdominal:     General: There is no distension.     Tenderness: There is no abdominal tenderness. There is no guarding or rebound.  Musculoskeletal:        General: Normal range of motion.     Cervical back: Normal range of motion and neck supple.  Skin:    Coloration: Skin is not pale.     Findings: No rash.  Neurological:     Mental Status: He is alert and oriented to person, place, and time.  Psychiatric:        Behavior: Behavior normal.     ED Results / Procedures / Treatments   Labs (all labs ordered are listed, but only abnormal results are displayed) Labs Reviewed  CBC WITH DIFFERENTIAL/PLATELET - Abnormal; Notable for the following components:      Result Value   Hemoglobin 12.1 (*)    HCT 36.6 (*)    RDW 17.1 (*)    Neutro Abs 8.1 (*)    All other components within normal limits  CBG MONITORING, ED - Abnormal; Notable for the following components:   Glucose-Capillary 66 (*)    All other components within normal limits  COMPREHENSIVE METABOLIC PANEL WITH GFR  CBG MONITORING, ED    EKG EKG Interpretation Date/Time:  Tuesday November 15 2023 13:40:20 EDT Ventricular Rate:  88 PR Interval:  216 QRS Duration:  128 QT Interval:  394 QTC Calculation: 476 R Axis:   -54  Text Interpretation: Sinus rhythm with 1st degree A-V block Left axis deviation Non-specific  intra-ventricular conduction block Minimal voltage criteria for LVH, may be normal variant ( Cornell product ) Cannot rule out Anteroseptal infarct , age undetermined T wave abnormality, consider lateral ischemia Abnormal ECG No significant change since last tracing Confirmed by Melene Plan [phone]) on 11/15/2023 2:02:56 PM  Radiology No results found.  Procedures .Critical Care  Performed by: Melene Plan, DO Authorized by: Melene Plan, DO   Critical care provider statement:    Critical care time (minutes):  35   Critical care time was exclusive of:  Separately billable procedures and treating other patients   Critical care was time spent personally by me on the following activities:  Development of treatment plan with patient or surrogate, discussions with consultants, evaluation of patient's response to treatment, examination of patient, ordering and review of laboratory studies, ordering and review of radiographic studies, ordering and performing treatments and interventions, pulse oximetry, re-evaluation of patient's condition and review of old charts   Care discussed with: admitting provider       Medications Ordered in ED Medications  sodium chloride 0.9 % bolus 1,000 mL (1,000 mLs Intravenous New Bag/Given 11/15/23 1422)    ED Course/ Medical Decision Making/ A&P                                 Medical Decision Making Amount and/or Complexity of Data Reviewed Labs: ordered.   63 yo M with a chief complaints of an event where he lost consciousness.  There was some concern about possible seizure-like activity.  Patient with no history of seizures.  Reportedly had no postictal period no biting of the tongue no loss of bowel or bladder.  He tells me that he did not really have anything to eat around lunchtime and he is quite hungry.  Blood glucose here in the 60s.  Perhaps the patient had a hypoglycemic event.  Will allow to eat and drink here.  Blood work.  Reassess.  No acute anemia.   Awaiting metabolic panel.   Signed out to Dr. Drury Geralds, please see their note for further details of care in the ED.  The patients results and plan were reviewed and discussed.   Any x-rays performed were independently reviewed by myself.   Differential diagnosis were considered with the presenting HPI.  Medications  sodium chloride 0.9 % bolus 1,000 mL (1,000 mLs Intravenous New Bag/Given 11/15/23 1422)    Vitals:   11/15/23 1338 11/15/23 1343  BP:  106/65  Pulse:  87  Resp:  16  Temp:  98.1 F (36.7 C)  TempSrc:  Oral  Weight: 59.9 kg   Height: 5\' 8"  (1.727 m)     Final diagnoses:  Loss of consciousness (HCC)  Hypoglycemia    Admission/ observation were discussed with the admitting physician, patient and/or family and they are comfortable with the plan.          Final Clinical Impression(s) / ED Diagnoses Final diagnoses:  Loss of consciousness (HCC)  Hypoglycemia    Rx / DC Orders ED Discharge Orders     None         Albertus Hughs, DO 11/15/23 1513    Albertus Hughs, DO 11/15/23 1513

## 2023-11-15 NOTE — ED Notes (Signed)
 Called [facility] x3  and was put on hold for over 10 min each time. Was unable to notify Antonia Battiest of PT's discharge
